# Patient Record
Sex: Female | Born: 1995
Health system: Southern US, Community
[De-identification: ages and names within clinical notes are randomized; demographics above are authoritative.]

## PROBLEM LIST (undated history)

## (undated) ENCOUNTER — Inpatient Hospital Stay (HOSPITAL_COMMUNITY): Payer: Self-pay

## (undated) DIAGNOSIS — F329 Major depressive disorder, single episode, unspecified: Secondary | ICD-10-CM

## (undated) DIAGNOSIS — E1140 Type 2 diabetes mellitus with diabetic neuropathy, unspecified: Secondary | ICD-10-CM

## (undated) DIAGNOSIS — B379 Candidiasis, unspecified: Secondary | ICD-10-CM

## (undated) DIAGNOSIS — F32A Depression, unspecified: Secondary | ICD-10-CM

## (undated) DIAGNOSIS — Z3042 Encounter for surveillance of injectable contraceptive: Secondary | ICD-10-CM

## (undated) DIAGNOSIS — Z8742 Personal history of other diseases of the female genital tract: Secondary | ICD-10-CM

## (undated) DIAGNOSIS — R55 Syncope and collapse: Secondary | ICD-10-CM

## (undated) DIAGNOSIS — O3090 Multiple gestation, unspecified, unspecified trimester: Secondary | ICD-10-CM

## (undated) DIAGNOSIS — R200 Anesthesia of skin: Secondary | ICD-10-CM

## (undated) DIAGNOSIS — N939 Abnormal uterine and vaginal bleeding, unspecified: Secondary | ICD-10-CM

## (undated) DIAGNOSIS — L03032 Cellulitis of left toe: Secondary | ICD-10-CM

## (undated) HISTORY — DX: Anesthesia of skin: R20.0

## (undated) HISTORY — DX: Candidiasis, unspecified: B37.9

## (undated) HISTORY — DX: Abnormal uterine and vaginal bleeding, unspecified: N93.9

## (undated) HISTORY — DX: Encounter for surveillance of injectable contraceptive: Z30.42

## (undated) HISTORY — PX: TONSILLECTOMY: SUR1361

## (undated) HISTORY — DX: Cellulitis of left toe: L03.032

## (undated) HISTORY — PX: ADENOIDECTOMY: SHX5191

---
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
## 2014-03-14 ENCOUNTER — Ambulatory Visit (INDEPENDENT_AMBULATORY_CARE_PROVIDER_SITE_OTHER): Payer: Medicaid Other | Admitting: *Deleted

## 2014-03-14 DIAGNOSIS — Z111 Encounter for screening for respiratory tuberculosis: Secondary | ICD-10-CM

## 2014-03-28 ENCOUNTER — Ambulatory Visit (INDEPENDENT_AMBULATORY_CARE_PROVIDER_SITE_OTHER): Payer: Medicaid Other | Admitting: *Deleted

## 2014-03-28 DIAGNOSIS — Z111 Encounter for screening for respiratory tuberculosis: Secondary | ICD-10-CM

## 2014-03-28 LAB — TB SKIN TEST

## 2014-03-30 ENCOUNTER — Encounter: Payer: Self-pay | Admitting: *Deleted

## 2014-03-30 ENCOUNTER — Ambulatory Visit (INDEPENDENT_AMBULATORY_CARE_PROVIDER_SITE_OTHER): Payer: Medicaid Other | Admitting: *Deleted

## 2014-03-30 DIAGNOSIS — Z111 Encounter for screening for respiratory tuberculosis: Secondary | ICD-10-CM

## 2014-03-30 LAB — TB SKIN TEST
Induration: 0 mm
TB SKIN TEST: NEGATIVE

## 2014-03-30 NOTE — Progress Notes (Signed)
   Pt in nurse clinic for PPD Reading.  Negative, 0 MM.  Lisa Marshall, Lisa L, RN

## 2014-04-15 ENCOUNTER — Other Ambulatory Visit: Payer: Self-pay | Admitting: Family Medicine

## 2014-06-27 ENCOUNTER — Encounter (HOSPITAL_COMMUNITY): Payer: Self-pay | Admitting: Obstetrics

## 2014-07-26 ENCOUNTER — Encounter (HOSPITAL_COMMUNITY): Payer: Self-pay | Admitting: *Deleted

## 2014-07-26 ENCOUNTER — Inpatient Hospital Stay (HOSPITAL_COMMUNITY)
Admission: AD | Admit: 2014-07-26 | Discharge: 2014-07-27 | Disposition: A | Discharge status: Home / Self Care | Payer: Medicaid Other | Source: Ambulatory Visit | Attending: Obstetrics & Gynecology | Admitting: Obstetrics & Gynecology

## 2014-07-26 DIAGNOSIS — N898 Other specified noninflammatory disorders of vagina: Secondary | ICD-10-CM | POA: Diagnosis not present

## 2014-07-26 DIAGNOSIS — Z3202 Encounter for pregnancy test, result negative: Secondary | ICD-10-CM | POA: Insufficient documentation

## 2014-07-26 DIAGNOSIS — R1032 Left lower quadrant pain: Secondary | ICD-10-CM | POA: Diagnosis not present

## 2014-07-26 DIAGNOSIS — K5900 Constipation, unspecified: Secondary | ICD-10-CM

## 2014-07-26 LAB — URINALYSIS, ROUTINE W REFLEX MICROSCOPIC
Bilirubin Urine: NEGATIVE
Glucose, UA: NEGATIVE mg/dL
Hgb urine dipstick: NEGATIVE
Ketones, ur: NEGATIVE mg/dL
LEUKOCYTES UA: NEGATIVE
Nitrite: NEGATIVE
PROTEIN: NEGATIVE mg/dL
Specific Gravity, Urine: 1.025 (ref 1.005–1.030)
UROBILINOGEN UA: 0.2 mg/dL (ref 0.0–1.0)
pH: 6.5 (ref 5.0–8.0)

## 2014-07-26 LAB — POCT PREGNANCY, URINE: Preg Test, Ur: NEGATIVE

## 2014-07-26 NOTE — MAU Note (Signed)
PT  SAYS SHE HAD TWIN ON 3-26-  BY   DR MARSHALL.    WAS  BREAST FEEDING.    SAYS  HER  INCISION  STARTED HURTING  ON 11-29-     NO MEDS.   DID  NOT CALL DR  MARSHALL.     SAYS HAS VAG D/C     AND  HAS ODOR.   WAS ON DEPO - FIRST INJECTION  WAS MARCH-  DID  NOT GET INJECTION   IN June    LAST SEX-  2014     HAS DIARRHEA-   LOOSE-    SEES BLOOD   WHEN  SHE WIPES.     FAMILY  DR-   IS  [facility]

## 2014-07-26 NOTE — MAU Provider Note (Signed)
History     CSN: [id number]  Arrival date and time: 07/26/14 2108   First Provider Initiated Contact with Patient 07/26/14 2351      No chief complaint on file.  HPI  Ms. Lisa Marshall is a [age] y.o. G1P1002 who presents to MAU today with complaint of LLQ pain and vaginal discharge. She states pain x 2 days that comes and goes. She states yellow-brown vaginal discharge and occasional spotting with wiping. Patient denies intercourse x > 1 year. She states that she was given Depo Provera after her last pregnancy and last dose was in March. She states normal periods have resumed x 3 months. She denies N/V/D but states mild constipation x 2 days. She reports subjective fevers.   OB History    Gravida Para Term Preterm AB TAB SAB Ectopic Multiple Living   1 1 1  0 0 0 0 0 1 2      Past Medical History  Diagnosis Date  . Asthma   . History of PID   . Depression   . Pregnancy, multiple     Past Surgical History  Procedure Laterality Date  . No past surgeries    . Tonsillectomy    . Addenoidectomy    . Cesarean section N/A 11/18/2013    Procedure: Primary Cesarean Section Delivery Baby "A" Girl @ 0447, Apgars 1/2/4, Baby "B" @ 0450, Apgars 8/9;  Surgeon: Kathreen CosierBernard A Marshall, MD;  Location: [facility];  Service: Obstetrics;  Laterality: N/A;    Family History  Problem Relation Age of Onset  . Asthma Other   . Diabetes Other   . Cancer Other   . Sickle cell anemia Mother     History  Substance Use Topics  . Smoking status: Never Smoker   . Smokeless tobacco: Not on file  . Alcohol Use: No    Allergies:  Allergies  Allergen Reactions  . Penicillins Hives    Prescriptions prior to admission  Medication Sig Dispense Refill Last Dose  . ibuprofen (ADVIL,MOTRIN) 600 MG tablet Take 1 tablet (600 mg total) by mouth every 6 (six) hours as needed. 30 tablet 5   . Iron-FA-B Cmp-C-Biot-Probiotic (FUSION PLUS) CAPS Take 1 capsule by mouth daily before breakfast. 30 capsule 5   .  oxyCODONE-acetaminophen (PERCOCET/ROXICET) 5-325 MG per tablet Take 1-2 tablets by mouth every 4 (four) hours as needed for severe pain (moderate - severe pain). 40 tablet 0   . PROVENTIL HFA 108 (90 BASE) MCG/ACT inhaler INHALE 2 PUFFS INTO THE LUNGS EVERY 6 (SIX) HOURS AS NEEDED FOR WHEEZING OR SHORTNESS OF BREATH. 6.7 each 2     Review of Systems  Constitutional: Negative for fever and malaise/fatigue.  Gastrointestinal: Positive for abdominal pain and constipation. Negative for nausea, vomiting and diarrhea.  Genitourinary: Negative for dysuria, urgency and frequency.       + vaginal bleeding, discharge   Physical Exam   Blood pressure 107/65, pulse 82, temperature 98.2 F (36.8 C), temperature source Oral, resp. rate 18, height 5\' 3"  (1.6 m), weight 287 lb 4 oz (130.296 kg), last menstrual period 07/17/2014, unknown if currently breastfeeding.  Physical Exam  Constitutional: She is oriented to person, place, and time. She appears well-developed and well-nourished. No distress.  HENT:  Head: Normocephalic.  Cardiovascular: Normal rate.   Respiratory: Effort normal.  GI: Soft. Bowel sounds are normal. She exhibits no distension and no mass. There is tenderness (mild tenderness to palpation of the LLQ). There is no rebound and no  guarding.  Genitourinary: Uterus is not enlarged and not tender. Cervix exhibits no motion tenderness, no discharge and no friability. Right adnexum displays no mass and no tenderness. Left adnexum displays no mass and no tenderness. No bleeding in the vagina. Vaginal discharge (small amount of yellow discharge noted with mucous) found.  Neurological: She is alert and oriented to person, place, and time.  Skin: Skin is warm and dry. No erythema.  Psychiatric: She has a normal mood and affect.   Results for orders placed or performed during the hospital encounter of 07/26/14 (from the past 24 hour(s))  Urinalysis, Routine w reflex microscopic     Status: None    Collection Time: 07/26/14 10:43 PM  Result Value Ref Range   Color, Urine YELLOW YELLOW   APPearance CLEAR CLEAR   Specific Gravity, Urine 1.025 1.005 - 1.030   pH 6.5 5.0 - 8.0   Glucose, UA NEGATIVE NEGATIVE mg/dL   Hgb urine dipstick NEGATIVE NEGATIVE   Bilirubin Urine NEGATIVE NEGATIVE   Ketones, ur NEGATIVE NEGATIVE mg/dL   Protein, ur NEGATIVE NEGATIVE mg/dL   Urobilinogen, UA 0.2 0.0 - 1.0 mg/dL   Nitrite NEGATIVE NEGATIVE   Leukocytes, UA NEGATIVE NEGATIVE  Pregnancy, urine POC     Status: None   Collection Time: 07/26/14 10:54 PM  Result Value Ref Range   Preg Test, Ur NEGATIVE NEGATIVE  Wet prep, genital     Status: Abnormal   Collection Time: 07/26/14 11:55 PM  Result Value Ref Range   Yeast Wet Prep HPF POC NONE SEEN NONE SEEN   Trich, Wet Prep NONE SEEN NONE SEEN   Clue Cells Wet Prep HPF POC NONE SEEN NONE SEEN   WBC, Wet Prep HPF POC FEW (A) NONE SEEN  CBC with Differential     Status: Abnormal   Collection Time: 07/27/14 12:00 AM  Result Value Ref Range   WBC 4.4 4.0 - 10.5 K/uL   RBC 4.25 3.87 - 5.11 MIL/uL   Hemoglobin 10.2 (L) 12.0 - 15.0 g/dL   HCT 11.932.1 (L) 14.736.0 - 82.946.0 %   MCV 75.5 (L) 78.0 - 100.0 fL   MCH 24.0 (L) 26.0 - 34.0 pg   MCHC 31.8 30.0 - 36.0 g/dL   RDW 56.217.3 (H) 13.011.5 - 86.515.5 %   Platelets 356 150 - 400 K/uL   Neutrophils Relative % 47 43 - 77 %   Neutro Abs 2.0 1.7 - 7.7 K/uL   Lymphocytes Relative 45 12 - 46 %   Lymphs Abs 2.0 0.7 - 4.0 K/uL   Monocytes Relative 7 3 - 12 %   Monocytes Absolute 0.3 0.1 - 1.0 K/uL   Eosinophils Relative 1 0 - 5 %   Eosinophils Absolute 0.1 0.0 - 0.7 K/uL   Basophils Relative 0 0 - 1 %   Basophils Absolute 0.0 0.0 - 0.1 K/uL    MAU Course  Procedures None  MDM UPT - negative UA,wet prep, GC/Chlamydia, CBC today  Assessment and Plan  A: LLQ abdominal pain, most likely GI  P: Discharge home GC/Chlamydia pending Patient advised to increase PO hydration Discussed use of Colace or Miralax OTC  PRN for constipation Patient advised to follow-up with [facility] if symptoms persist or worsen Patient may return to MAU as needed or if her condition were to change or worsen   Marny LowensteinJulie N Wenzel, [name]  07/26/2014, 11:51 PM

## 2014-07-27 DIAGNOSIS — R1032 Left lower quadrant pain: Secondary | ICD-10-CM

## 2014-07-27 DIAGNOSIS — K5900 Constipation, unspecified: Secondary | ICD-10-CM

## 2014-07-27 LAB — CBC WITH DIFFERENTIAL/PLATELET
BASOS ABS: 0 10*3/uL (ref 0.0–0.1)
Basophils Relative: 0 % (ref 0–1)
EOS ABS: 0.1 10*3/uL (ref 0.0–0.7)
EOS PCT: 1 % (ref 0–5)
HCT: 32.1 % — ABNORMAL LOW (ref 36.0–46.0)
Hemoglobin: 10.2 g/dL — ABNORMAL LOW (ref 12.0–15.0)
Lymphocytes Relative: 45 % (ref 12–46)
Lymphs Abs: 2 10*3/uL (ref 0.7–4.0)
MCH: 24 pg — AB (ref 26.0–34.0)
MCHC: 31.8 g/dL (ref 30.0–36.0)
MCV: 75.5 fL — AB (ref 78.0–100.0)
Monocytes Absolute: 0.3 10*3/uL (ref 0.1–1.0)
Monocytes Relative: 7 % (ref 3–12)
Neutro Abs: 2 10*3/uL (ref 1.7–7.7)
Neutrophils Relative %: 47 % (ref 43–77)
PLATELETS: 356 10*3/uL (ref 150–400)
RBC: 4.25 MIL/uL (ref 3.87–5.11)
RDW: 17.3 % — AB (ref 11.5–15.5)
WBC: 4.4 10*3/uL (ref 4.0–10.5)

## 2014-07-27 LAB — WET PREP, GENITAL
CLUE CELLS WET PREP: NONE SEEN
Trich, Wet Prep: NONE SEEN
YEAST WET PREP: NONE SEEN

## 2014-07-27 LAB — HIV ANTIBODY (ROUTINE TESTING W REFLEX): HIV: NONREACTIVE

## 2014-07-27 LAB — GC/CHLAMYDIA PROBE AMP
CT Probe RNA: NEGATIVE
GC Probe RNA: NEGATIVE

## 2014-07-27 NOTE — Discharge Instructions (Signed)
High-Fiber Diet Fiber is found in fruits, vegetables, and grains. A high-fiber diet encourages the addition of more whole grains, legumes, fruits, and vegetables in your diet. The recommended amount of fiber for adult males is 38 g per day. For adult females, it is 25 g per day. Pregnant and lactating women should get 28 g of fiber per day. If you have a digestive or bowel problem, ask your caregiver for advice before adding high-fiber foods to your diet. Eat a variety of high-fiber foods instead of only a select few type of foods.  PURPOSE  To increase stool bulk.  To make bowel movements more regular to prevent constipation.  To lower cholesterol.  To prevent overeating. WHEN IS THIS DIET USED?  It may be used if you have constipation and hemorrhoids.  It may be used if you have uncomplicated diverticulosis (intestine condition) and irritable bowel syndrome.  It may be used if you need help with weight management.  It may be used if you want to add it to your diet as a protective measure against atherosclerosis, diabetes, and cancer. SOURCES OF FIBER  Whole-grain breads and cereals.  Fruits, such as apples, oranges, bananas, berries, prunes, and pears.  Vegetables, such as green peas, carrots, sweet potatoes, beets, broccoli, cabbage, spinach, and artichokes.  Legumes, such split peas, soy, lentils.  Almonds. FIBER CONTENT IN FOODS Starches and Grains / Dietary Fiber (g)  Cheerios, 1 cup / 3 g  Corn Flakes cereal, 1 cup / 0.7 g  Rice crispy treat cereal, 1 cup / 0.3 g  Instant oatmeal (cooked),  cup / 2 g  Frosted wheat cereal, 1 cup / 5.1 g  Brown, long-grain rice (cooked), 1 cup / 3.5 g  White, long-grain rice (cooked), 1 cup / 0.6 g  Enriched macaroni (cooked), 1 cup / 2.5 g Legumes / Dietary Fiber (g)  Baked beans (canned, plain, or vegetarian),  cup / 5.2 g  Kidney beans (canned),  cup / 6.8 g  Pinto beans (cooked),  cup / 5.5 g Breads and Crackers  / Dietary Fiber (g)  Plain or honey graham crackers, 2 squares / 0.7 g  Saltine crackers, 3 squares / 0.3 g  Plain, salted pretzels, 10 pieces / 1.8 g  Whole-wheat bread, 1 slice / 1.9 g  White bread, 1 slice / 0.7 g  Raisin bread, 1 slice / 1.2 g  Plain bagel, 3 oz / 2 g  Flour tortilla, 1 oz / 0.9 g  Corn tortilla, 1 small / 1.5 g  Hamburger or hotdog bun, 1 small / 0.9 g Fruits / Dietary Fiber (g)  Apple with skin, 1 medium / 4.4 g  Sweetened applesauce,  cup / 1.5 g  Banana,  medium / 1.5 g  Grapes, 10 grapes / 0.4 g  Orange, 1 small / 2.3 g  Raisin, 1.5 oz / 1.6 g  Melon, 1 cup / 1.4 g Vegetables / Dietary Fiber (g)  Green beans (canned),  cup / 1.3 g  Carrots (cooked),  cup / 2.3 g  Broccoli (cooked),  cup / 2.8 g  Peas (cooked),  cup / 4.4 g  Mashed potatoes,  cup / 1.6 g  Lettuce, 1 cup / 0.5 g  Corn (canned),  cup / 1.6 g  Tomato,  cup / 1.1 g Document Released: 08/12/2005 Document Revised: 02/11/2012 Document Reviewed: 11/14/2011 ExitCare Patient Information 2015 [place], [place]. This information is not intended to replace advice given to you by your health care provider.  Make sure you discuss any questions you have with your health care provider. Constipation Constipation is when a person:  Poops (has a bowel movement) less than 3 times a week.  Has a hard time pooping.  Has poop that is dry, hard, or bigger than normal. HOME CARE   Eat foods with a lot of fiber in them. This includes fruits, vegetables, beans, and whole grains such as brown rice.  Avoid fatty foods and foods with a lot of sugar. This includes french fries, hamburgers, cookies, candy, and soda.  If you are not getting enough fiber from food, take products with added fiber in them (supplements).  Drink enough fluid to keep your pee (urine) clear or pale yellow.  Exercise on a regular basis, or as told by your doctor.  Go to the restroom when you feel like  you need to poop. Do not hold it.  Only take medicine as told by your doctor. Do not take medicines that help you poop (laxatives) without talking to your doctor first. GET HELP RIGHT AWAY IF:   You have bright red blood in your poop (stool).  Your constipation lasts more than 4 days or gets worse.  You have belly (abdominal) or butt (rectal) pain.  You have thin poop (as thin as a pencil).  You lose weight, and it cannot be explained. MAKE SURE YOU:   Understand these instructions.  Will watch your condition.  Will get help right away if you are not doing well or get worse. Document Released: 01/29/2008 Document Revised: 08/17/2013 Document Reviewed: 05/24/2013 [facility] Patient Information 2015 [place]. This information is not intended to replace advice given to you by your health care provider. Make sure you discuss any questions you have with your health care provider.  Try over-the-counter Colace or Miralax for relief as directed

## 2014-10-23 ENCOUNTER — Encounter (HOSPITAL_COMMUNITY): Payer: Self-pay | Admitting: Emergency Medicine

## 2014-10-23 ENCOUNTER — Emergency Department (HOSPITAL_COMMUNITY): Payer: Medicaid Other

## 2014-10-23 ENCOUNTER — Emergency Department (HOSPITAL_COMMUNITY)
Admission: EM | Admit: 2014-10-23 | Discharge: 2014-10-24 | Disposition: A | Discharge status: Home / Self Care | Payer: Medicaid Other | Attending: Emergency Medicine | Admitting: Emergency Medicine

## 2014-10-23 DIAGNOSIS — Z3202 Encounter for pregnancy test, result negative: Secondary | ICD-10-CM | POA: Insufficient documentation

## 2014-10-23 DIAGNOSIS — R1030 Lower abdominal pain, unspecified: Secondary | ICD-10-CM | POA: Insufficient documentation

## 2014-10-23 DIAGNOSIS — Z8659 Personal history of other mental and behavioral disorders: Secondary | ICD-10-CM | POA: Insufficient documentation

## 2014-10-23 DIAGNOSIS — G8918 Other acute postprocedural pain: Secondary | ICD-10-CM

## 2014-10-23 DIAGNOSIS — J45909 Unspecified asthma, uncomplicated: Secondary | ICD-10-CM | POA: Insufficient documentation

## 2014-10-23 DIAGNOSIS — Z880 Allergy status to penicillin: Secondary | ICD-10-CM | POA: Insufficient documentation

## 2014-10-23 DIAGNOSIS — Z79899 Other long term (current) drug therapy: Secondary | ICD-10-CM | POA: Diagnosis not present

## 2014-10-23 DIAGNOSIS — Z9889 Other specified postprocedural states: Secondary | ICD-10-CM | POA: Insufficient documentation

## 2014-10-23 DIAGNOSIS — N898 Other specified noninflammatory disorders of vagina: Secondary | ICD-10-CM | POA: Insufficient documentation

## 2014-10-23 DIAGNOSIS — J069 Acute upper respiratory infection, unspecified: Secondary | ICD-10-CM | POA: Diagnosis not present

## 2014-10-23 DIAGNOSIS — R1032 Left lower quadrant pain: Secondary | ICD-10-CM | POA: Diagnosis present

## 2014-10-23 DIAGNOSIS — R519 Headache, unspecified: Secondary | ICD-10-CM

## 2014-10-23 DIAGNOSIS — R51 Headache: Secondary | ICD-10-CM

## 2014-10-23 LAB — URINALYSIS, ROUTINE W REFLEX MICROSCOPIC
Bilirubin Urine: NEGATIVE
Glucose, UA: NEGATIVE mg/dL
Hgb urine dipstick: NEGATIVE
Ketones, ur: NEGATIVE mg/dL
Leukocytes, UA: NEGATIVE
NITRITE: NEGATIVE
Protein, ur: NEGATIVE mg/dL
Specific Gravity, Urine: 1.02 (ref 1.005–1.030)
Urobilinogen, UA: 0.2 mg/dL (ref 0.0–1.0)
pH: 6 (ref 5.0–8.0)

## 2014-10-23 LAB — CBC WITH DIFFERENTIAL/PLATELET
BASOS PCT: 0 % (ref 0–1)
Basophils Absolute: 0 10*3/uL (ref 0.0–0.1)
EOS PCT: 0 % (ref 0–5)
Eosinophils Absolute: 0 10*3/uL (ref 0.0–0.7)
HCT: 34.8 % — ABNORMAL LOW (ref 36.0–46.0)
Hemoglobin: 10.9 g/dL — ABNORMAL LOW (ref 12.0–15.0)
Lymphocytes Relative: 26 % (ref 12–46)
Lymphs Abs: 0.9 10*3/uL (ref 0.7–4.0)
MCH: 25.3 pg — AB (ref 26.0–34.0)
MCHC: 31.3 g/dL (ref 30.0–36.0)
MCV: 80.9 fL (ref 78.0–100.0)
MONO ABS: 0.3 10*3/uL (ref 0.1–1.0)
Monocytes Relative: 8 % (ref 3–12)
NEUTROS ABS: 2.3 10*3/uL (ref 1.7–7.7)
Neutrophils Relative %: 66 % (ref 43–77)
PLATELETS: 379 10*3/uL (ref 150–400)
RBC: 4.3 MIL/uL (ref 3.87–5.11)
RDW: 16 % — AB (ref 11.5–15.5)
WBC: 3.4 10*3/uL — ABNORMAL LOW (ref 4.0–10.5)

## 2014-10-23 LAB — COMPREHENSIVE METABOLIC PANEL
ALK PHOS: 106 U/L (ref 39–117)
ALT: 17 U/L (ref 0–35)
AST: 16 U/L (ref 0–37)
Albumin: 3.9 g/dL (ref 3.5–5.2)
Anion gap: 5 (ref 5–15)
BUN: 11 mg/dL (ref 6–23)
CHLORIDE: 105 mmol/L (ref 96–112)
CO2: 24 mmol/L (ref 19–32)
Calcium: 8.6 mg/dL (ref 8.4–10.5)
Creatinine, Ser: 0.52 mg/dL (ref 0.50–1.10)
GFR calc Af Amer: >90 mL/min (ref >90)
GFR calc non Af Amer: >90 mL/min (ref >90)
Glucose, Bld: 92 mg/dL (ref 70–99)
POTASSIUM: 3.4 mmol/L — AB (ref 3.5–5.1)
Sodium: 134 mmol/L — ABNORMAL LOW (ref 135–145)
TOTAL PROTEIN: 7.4 g/dL (ref 6.0–8.3)
Total Bilirubin: 0.4 mg/dL (ref 0.3–1.2)

## 2014-10-23 LAB — LIPASE, BLOOD: Lipase: 18 U/L (ref 11–59)

## 2014-10-23 LAB — POC URINE PREG, ED: Preg Test, Ur: NEGATIVE

## 2014-10-23 MED ORDER — OXYCODONE-ACETAMINOPHEN 5-325 MG PO TABS
2.0000 | ORAL_TABLET | Freq: Once | ORAL | Status: AC
  Administered 2014-10-23: 2 via ORAL
  Filled 2014-10-23: qty 2

## 2014-10-23 MED ORDER — ONDANSETRON 8 MG PO TBDP
8.0000 mg | ORAL_TABLET | Freq: Once | ORAL | Status: AC
  Administered 2014-10-23: 8 mg via ORAL
  Filled 2014-10-23: qty 1

## 2014-10-23 NOTE — ED Provider Notes (Signed)
CSN: [id number]     Arrival date & time 10/23/14  2035 History   First MD Initiated Contact with Patient 10/23/14 2324     Chief Complaint  Patient presents with  . Abdominal Pain  . Headache     (Consider location/radiation/quality/duration/timing/severity/associated sxs/prior Treatment) The history is provided by the patient and medical records. No language interpreter was used.     Lisa Marshall is a 19 y.o. female  G1P0202 with a hx of asthma, hx of PID, depression presents to the Emergency Department complaining of gradual, persistent, progressively worsening left sided abd pain onset 8am this morning.  Pt reports her abd feels sharp and stabbing in the LLQ and left side.  Pt reports she normally has 1 BM per day. Her last BM was yesterday morning and normal.  Pt reports she has not had a BM or flatulence since yesterday.  Pt reports she has had the pain before without a diagnosis.   Associated symptoms include 3 episodes of NBNB emesis.  No diarrhea.  Pt denies all urinary or vaginal complaints.  Nothing makes it better or worse.   Pt reports associated rhinorrhea, sinus congestion, and left frontal headache.  Pt denies ST, otalgia, fever, chills, neck pain, neck stiffness.  Patient immediately asked for food upon my entrance into the room.   Past Medical History  Diagnosis Date  . Asthma   . History of PID   . Depression   . Pregnancy, multiple    Past Surgical History  Procedure Laterality Date  . No past surgeries    . Tonsillectomy    . Addenoidectomy    . Cesarean section N/A 11/18/2013    Procedure: Primary Cesarean Section Delivery Baby "A" Girl @ 0447, Apgars 1/2/4, Baby "B" @ 0450, Apgars 8/9;  Surgeon: Kathreen Cosier, MD;  Location: [facility];  Service: Obstetrics;  Laterality: N/A;   Family History  Problem Relation Age of Onset  . Asthma Other   . Diabetes Other   . Cancer Other   . Sickle cell anemia Mother    History  Substance Use Topics  . Smoking  status: Never Smoker   . Smokeless tobacco: Not on file  . Alcohol Use: No   OB History    Gravida Para Term Preterm AB TAB SAB Ectopic Multiple Living   0 0 0 0 0 1 2     Review of Systems  Constitutional: Negative for fever, diaphoresis, appetite change, fatigue and unexpected weight change.  HENT: Positive for congestion, rhinorrhea and sinus pressure. Negative for mouth sores.   Eyes: Negative for visual disturbance.  Respiratory: Negative for cough, chest tightness, shortness of breath and wheezing.   Cardiovascular: Negative for chest pain.  Gastrointestinal: Positive for abdominal pain. Negative for nausea, vomiting, diarrhea and constipation.  Endocrine: Negative for polydipsia, polyphagia and polyuria.  Genitourinary: Negative for dysuria, urgency, frequency and hematuria.  Musculoskeletal: Negative for back pain and neck stiffness.  Skin: Negative for rash.  Allergic/Immunologic: Negative for immunocompromised state.  Neurological: Positive for headaches (frontal ). Negative for syncope and light-headedness.  Hematological: Does not bruise/bleed easily.  Psychiatric/Behavioral: Negative for sleep disturbance. The patient is not nervous/anxious.       Allergies  Penicillins  Home Medications   Prior to Admission medications   Medication Sig Start Date End Date Taking? Authorizing Provider  fluticasone (FLONASE) 50 MCG/ACT nasal spray Place 2 sprays into both nostrils daily. 10/24/14   Ambria Mayfield, [name]  ibuprofen (  ADVIL,MOTRIN) 800 MG tablet Take 1 tablet (800 mg total) by mouth 3 (three) times daily. 10/24/14   Anju Sereno, [name]  Iron-FA-B Cmp-C-Biot-Probiotic (FUSION PLUS) CAPS Take 1 capsule by mouth daily before breakfast. Patient not taking: Reported on 10/23/2014 11/21/13   Brock Bad, MD  oxyCODONE-acetaminophen (PERCOCET/ROXICET) 5-325 MG per tablet Take 1-2 tablets by mouth every 4 (four) hours as needed for severe pain (moderate -  severe pain). Patient not taking: Reported on 10/23/2014 11/21/13   Brock Bad, MD  promethazine (PHENERGAN) 25 MG tablet Take 1 tablet (25 mg total) by mouth every 6 (six) hours as needed for nausea or vomiting. 10/24/14   Dahlia Client Eben Choinski, [name]  PROVENTIL HFA 108 (90 BASE) MCG/ACT inhaler INHALE 2 PUFFS INTO THE LUNGS EVERY 6 (SIX) HOURS AS NEEDED FOR WHEEZING OR SHORTNESS OF BREATH. Patient not taking: Reported on 10/23/2014 04/15/14   Janit Pagan, MD   BP 108/52 mmHg  Pulse 73  Temp(Src) 98.1 F (36.7 C) (Oral)  Resp 20  Ht  (1.6 m)  Wt 280 lb (127.007 kg)  BMI 49.61 kg/m2  SpO2 100%  LMP 08/30/2014 Physical Exam  Constitutional: She appears well-developed and well-nourished. No distress.  Awake, alert, nontoxic appearance  HENT:  Head: Normocephalic and atraumatic.  Mouth/Throat: Oropharynx is clear and moist. No oropharyngeal exudate.  Eyes: Conjunctivae are normal. No scleral icterus.  Neck: Normal range of motion. Neck supple.  Cardiovascular: Normal rate, regular rhythm, normal heart sounds and intact distal pulses.   No murmur heard. Pulmonary/Chest: Effort normal and breath sounds normal. No respiratory distress. She has no wheezes.  Equal chest expansion  Abdominal: Soft. Bowel sounds are normal. She exhibits no distension and no mass. There is no tenderness. There is no rebound and no guarding. Hernia confirmed negative in the right inguinal area and confirmed negative in the left inguinal area.  Genitourinary: Uterus normal. No labial fusion. There is no rash, tenderness or lesion on the right labia. There is no rash, tenderness or lesion on the left labia. Uterus is not deviated, not enlarged, not fixed and not tender. Cervix exhibits no motion tenderness, no discharge and no friability. Right adnexum displays no mass, no tenderness and no fullness. Left adnexum displays no mass, no tenderness and no fullness. No erythema, tenderness or bleeding in the vagina.  No foreign body around the vagina. No signs of injury around the vagina. Vaginal discharge (Small, thin, white, nonodorous) found.  Musculoskeletal: Normal range of motion. She exhibits no edema.  Lymphadenopathy:       Right: No inguinal adenopathy present.       Left: No inguinal adenopathy present.  Neurological: She is alert.  Mental Status:  Alert, oriented, thought content appropriate, able to give a coherent history. Speech fluent without evidence of aphasia. Able to follow 2 step commands without difficulty.  Cranial Nerves:  II:  Peripheral visual fields grossly normal, pupils equal, round, reactive to light III,IV, VI: ptosis not present, extra-ocular motions intact bilaterally  V,VII: smile symmetric, facial light touch sensation equal VIII: hearing grossly normal to voice  X: uvula elevates symmetrically  XI: bilateral shoulder shrug symmetric and strong XII: midline tongue extension without fassiculations Motor:  Normal tone. 5/5 in upper and lower extremities bilaterally including strong and equal grip strength and dorsiflexion/plantar flexion Sensory: Pinprick and light touch normal in all extremities.  Deep Tendon Reflexes: 2+ and symmetric in the biceps and patella Cerebellar: normal finger-to-nose with bilateral upper extremities Gait: normal gait  and balance CV: distal pulses palpable throughout  No clonus  Skin: Skin is warm and dry. She is not diaphoretic. No erythema.  Psychiatric: She has a normal mood and affect.  Nursing note and vitals reviewed.   ED Course  Procedures (including critical care time) Labs Review Labs Reviewed  WET PREP, GENITAL - Abnormal; Notable for the following:    Yeast Wet Prep HPF POC FEW (*)    Clue Cells Wet Prep HPF POC RARE (*)    WBC, Wet Prep HPF POC RARE (*)    All other components within normal limits  CBC WITH DIFFERENTIAL/PLATELET - Abnormal; Notable for the following:    WBC 3.4 (*)    Hemoglobin 10.9 (*)    HCT 34.8  (*)    MCH 25.3 (*)    RDW 16.0 (*)    All other components within normal limits  COMPREHENSIVE METABOLIC PANEL - Abnormal; Notable for the following:    Sodium 134 (*)    Potassium 3.4 (*)    All other components within normal limits  LIPASE, BLOOD  URINALYSIS, ROUTINE W REFLEX MICROSCOPIC  POC URINE PREG, ED  GC/CHLAMYDIA PROBE AMP ([place])    Imaging Review Dg Abd Acute W/chest  10/24/2014   CLINICAL DATA:  Abdominal pain at cesarean section scar since March 2015. Nausea and vomiting starting today.  EXAM: ACUTE ABDOMEN SERIES (ABDOMEN 2 VIEW & CHEST 1 VIEW)  COMPARISON:  None.  FINDINGS: There is no evidence of dilated bowel loops or free intraperitoneal air. Left pelvic calcifications are likely phleboliths; there is no concerning intra-abdominal mass effect or calcification. Heart size and mediastinal contours are within normal limits. Both lungs are clear.  IMPRESSION: Negative abdominal radiographs.  No acute cardiopulmonary disease.   Electronically Signed   By: Marnee Spring M.D.   On: 10/24/2014 00:24     EKG Interpretation None      MDM   Final diagnoses:  Pain at surgical site  Lower abdominal pain  Viral URI  Sinus headache   Quentina S Joubert presents with complaints of mild left-sided abdominal pain.  She also reports left-sided frontal, sinus headache which began this morning. She denies vision changes. Her vitals are stable. She's had no vomiting in the room. Abdomen is soft and nontender. Labs are reassuring.  Pelvic exam without cervical motion or adnexal tenderness. Wet prep non-concerning for vaginal infection or STI.  UA without urinary tract infection.  Patient is nontoxic, nonseptic appearing, in no apparent distress.  Patient's pain and other symptoms adequately managed in emergency department. Labs, imaging and vitals reviewed.  Patient does not meet the SIRS or Sepsis criteria.  On repeat exam patient does not have a surgical abdomin and there are  no peritoneal signs.  No indication of appendicitis, bowel obstruction, bowel perforation, cholecystitis, diverticulitis, PID or ectopic pregnancy.  Patient discharged home with symptomatic treatment and given strict instructions for follow-up with their primary care physician.   Pt HA treated and improved while in ED.  Presentation is non concerning for [facility], ICH, Meningitis, or temporal arteritis. Pt is afebrile with no focal neuro deficits, nuchal rigidity, or change in vision.   I have personally reviewed patient's vitals, nursing note and any pertinent labs or imaging.  I performed an focused physical exam; undressed when appropriate .    It has been determined that no acute conditions requiring further emergency intervention are present at this time. The patient/guardian have been advised of the diagnosis and plan.  I reviewed any labs and imaging including any potential incidental findings. We have discussed signs and symptoms that warrant return to the ED and they are listed in the discharge instructions.    Vital signs are stable at discharge.   BP 108/52 mmHg  Pulse 73  Temp(Src) 98.1 F (36.7 C) (Oral)  Resp 20  Ht 5\' 3"  (1.6 m)  Wt 280 lb (127.007 kg)  BMI 49.61 kg/m2  SpO2 100%  LMP 08/30/2014         Dahlia ClientHannah Hugh Garrow, [name] 10/24/14 0124  Olivia Mackielga M Otter, MD 10/24/14 [phone]

## 2014-10-23 NOTE — ED Notes (Signed)
Pt states that tonight she started having N/V, abdominal pain and a headache tonight. Pain is in lower abdomen where her C-section scar is. Denies vag discharge. Denies urinary sx. Alert and oriented.

## 2014-10-23 NOTE — ED Notes (Signed)
Pt complaining of left sided headache. Started this morning when she woke up.  Mild dizziness. Denies blurry vision. Light sensitive.

## 2014-10-24 LAB — WET PREP, GENITAL: TRICH WET PREP: NONE SEEN

## 2014-10-24 LAB — GC/CHLAMYDIA PROBE AMP (~~LOC~~) NOT AT ARMC
CHLAMYDIA, DNA PROBE: NEGATIVE
NEISSERIA GONORRHEA: NEGATIVE

## 2014-10-24 MED ORDER — PROMETHAZINE HCL 25 MG PO TABS: 25.0000 mg | ORAL_TABLET | Freq: Four times a day (QID) | ORAL | Status: DC | PRN

## 2014-10-24 MED ORDER — IBUPROFEN 800 MG PO TABS: 800.0000 mg | ORAL_TABLET | Freq: Three times a day (TID) | ORAL | Status: DC

## 2014-10-24 MED ORDER — FLUTICASONE PROPIONATE 50 MCG/ACT NA SUSP: 2.0000 | Freq: Every day | NASAL | Status: DC

## 2014-10-24 NOTE — Discharge Instructions (Signed)
1. Medications: ibuprofen, flonase, usual home medications 2. Treatment: rest, drink plenty of fluids, advance diet slowly 3. Follow Up: Please followup with your primary doctor in 2 days for discussion of your diagnoses and further evaluation after today's visit; if you do not have a primary care doctor use the resource guide provided to find one; Please return to the ER for persistent vomiting, high fevers or worsening symptoms    Abdominal Pain, Women Abdominal (stomach, pelvic, or belly) pain can be caused by many things. It is important to tell your doctor:  The location of the pain.  Does it come and go or is it present all the time?  Are there things that start the pain (eating certain foods, exercise)?  Are there other symptoms associated with the pain (fever, nausea, vomiting, diarrhea)? All of this is helpful to know when trying to find the cause of the pain. CAUSES   Stomach: virus or bacteria infection, or ulcer.  Intestine: appendicitis (inflamed appendix), regional ileitis (Crohn's disease), ulcerative colitis (inflamed colon), irritable bowel syndrome, diverticulitis (inflamed diverticulum of the colon), or cancer of the stomach or intestine.  Gallbladder disease or stones in the gallbladder.  Kidney disease, kidney stones, or infection.  Pancreas infection or cancer.  Fibromyalgia (pain disorder).  Diseases of the female organs:  Uterus: fibroid (non-cancerous) tumors or infection.  Fallopian tubes: infection or tubal pregnancy.  Ovary: cysts or tumors.  Pelvic adhesions (scar tissue).  Endometriosis (uterus lining tissue growing in the pelvis and on the pelvic organs).  Pelvic congestion syndrome (female organs filling up with blood just before the menstrual period).  Pain with the menstrual period.  Pain with ovulation (producing an egg).  Pain with an IUD (intrauterine device, birth control) in the uterus.  Cancer of the female  organs.  Functional pain (pain not caused by a disease, may improve without treatment).  Psychological pain.  Depression. DIAGNOSIS  Your doctor will decide the seriousness of your pain by doing an examination.  Blood tests.  X-rays.  Ultrasound.  CT scan (computed tomography, special type of X-ray).  MRI (magnetic resonance imaging).  Cultures, for infection.  Barium enema (dye inserted in the large intestine, to better view it with X-rays).  Colonoscopy (looking in intestine with a lighted tube).  Laparoscopy (minor surgery, looking in abdomen with a lighted tube).  Major abdominal exploratory surgery (looking in abdomen with a large incision). TREATMENT  The treatment will depend on the cause of the pain.   Many cases can be observed and treated at home.  Over-the-counter medicines recommended by your caregiver.  Prescription medicine.  Antibiotics, for infection.  Birth control pills, for painful periods or for ovulation pain.  Hormone treatment, for endometriosis.  Nerve blocking injections.  Physical therapy.  Antidepressants.  Counseling with a psychologist or psychiatrist.  Minor or major surgery. HOME CARE INSTRUCTIONS   Do not take laxatives, unless directed by your caregiver.  Take over-the-counter pain medicine only if ordered by your caregiver. Do not take aspirin because it can cause an upset stomach or bleeding.  Try a clear liquid diet (broth or water) as ordered by your caregiver. Slowly move to a bland diet, as tolerated, if the pain is related to the stomach or intestine.  Have a thermometer and take your temperature several times a day, and record it.  Bed rest and sleep, if it helps the pain.  Avoid sexual intercourse, if it causes pain.  Avoid stressful situations.  Keep your follow-up appointments  and tests, as your caregiver orders.  If the pain does not go away with medicine or surgery, you may  try:  Acupuncture.  Relaxation exercises (yoga, meditation).  Group therapy.  Counseling. SEEK MEDICAL CARE IF:   You notice certain foods cause stomach pain.  Your home care treatment is not helping your pain.  You need stronger pain medicine.  You want your IUD removed.  You feel faint or lightheaded.  You develop nausea and vomiting.  You develop a rash.  You are having side effects or an allergy to your medicine. SEEK IMMEDIATE MEDICAL CARE IF:   Your pain does not go away or gets worse.  You have a fever.  Your pain is felt only in portions of the abdomen. The right side could possibly be appendicitis. The left lower portion of the abdomen could be colitis or diverticulitis.  You are passing blood in your stools (bright red or black tarry stools, with or without vomiting).  You have blood in your urine.  You develop chills, with or without a fever.  You pass out. MAKE SURE YOU:   Understand these instructions.  Will watch your condition.  Will get help right away if you are not doing well or get worse. Document Released: 06/09/2007 Document Revised: 12/27/2013 Document Reviewed: 06/29/2009 [facility] Patient Information 2015 [place], [place]. This information is not intended to replace advice given to you by your health care provider. Make sure you discuss any questions you have with your health care provider.

## 2014-10-24 NOTE — ED Notes (Signed)
Provided patient Lisa Marshall and water for PO challenge.

## 2014-11-16 ENCOUNTER — Encounter (HOSPITAL_COMMUNITY): Payer: Self-pay

## 2014-11-16 ENCOUNTER — Emergency Department (HOSPITAL_COMMUNITY)
Admission: EM | Admit: 2014-11-16 | Discharge: 2014-11-16 | Disposition: A | Discharge status: Home / Self Care | Payer: Medicaid Other | Attending: Emergency Medicine | Admitting: Emergency Medicine

## 2014-11-16 DIAGNOSIS — Z791 Long term (current) use of non-steroidal anti-inflammatories (NSAID): Secondary | ICD-10-CM | POA: Insufficient documentation

## 2014-11-16 DIAGNOSIS — J0300 Acute streptococcal tonsillitis, unspecified: Secondary | ICD-10-CM | POA: Insufficient documentation

## 2014-11-16 DIAGNOSIS — Z8659 Personal history of other mental and behavioral disorders: Secondary | ICD-10-CM | POA: Diagnosis not present

## 2014-11-16 DIAGNOSIS — Z880 Allergy status to penicillin: Secondary | ICD-10-CM | POA: Insufficient documentation

## 2014-11-16 DIAGNOSIS — Z7951 Long term (current) use of inhaled steroids: Secondary | ICD-10-CM | POA: Insufficient documentation

## 2014-11-16 DIAGNOSIS — Z79899 Other long term (current) drug therapy: Secondary | ICD-10-CM | POA: Diagnosis not present

## 2014-11-16 DIAGNOSIS — J45909 Unspecified asthma, uncomplicated: Secondary | ICD-10-CM | POA: Insufficient documentation

## 2014-11-16 DIAGNOSIS — Z8742 Personal history of other diseases of the female genital tract: Secondary | ICD-10-CM | POA: Insufficient documentation

## 2014-11-16 DIAGNOSIS — J029 Acute pharyngitis, unspecified: Secondary | ICD-10-CM | POA: Diagnosis present

## 2014-11-16 LAB — RAPID STREP SCREEN (MED CTR MEBANE ONLY): STREPTOCOCCUS, GROUP A SCREEN (DIRECT): POSITIVE — AB

## 2014-11-16 MED ORDER — HYDROCODONE-ACETAMINOPHEN 7.5-325 MG/15ML PO SOLN
10.0000 mL | Freq: Once | ORAL | Status: AC
  Administered 2014-11-16: 10 mL via ORAL
  Filled 2014-11-16: qty 15

## 2014-11-16 MED ORDER — HYDROCODONE-ACETAMINOPHEN 7.5-325 MG/15ML PO SOLN: 10.0000 mL | Freq: Four times a day (QID) | ORAL | Status: DC | PRN

## 2014-11-16 MED ORDER — CLINDAMYCIN HCL 300 MG PO CAPS: 300.0000 mg | ORAL_CAPSULE | Freq: Four times a day (QID) | ORAL | Status: DC

## 2014-11-16 MED ORDER — CLINDAMYCIN HCL 300 MG PO CAPS
300.0000 mg | ORAL_CAPSULE | Freq: Once | ORAL | Status: AC
  Administered 2014-11-16: 300 mg via ORAL
  Filled 2014-11-16: qty 1

## 2014-11-16 MED ORDER — DEXAMETHASONE SODIUM PHOSPHATE 10 MG/ML IJ SOLN
10.0000 mg | Freq: Once | INTRAMUSCULAR | Status: AC
  Administered 2014-11-16: 10 mg via INTRAMUSCULAR
  Filled 2014-11-16: qty 1

## 2014-11-16 NOTE — Discharge Instructions (Signed)
Read the information below.  Use the prescribed medication as directed.  Please discuss all new medications with your pharmacist.  Do not take additional tylenol while taking the prescribed pain medication to avoid overdose.  You may return to the Emergency Department at any time for worsening condition or any new symptoms that concern you.   If there is any possibility that you might be pregnant or if you are breast feeding, please let your health care provider know and discuss this with the pharmacist to ensure medication safety.  If you develop high fevers, difficulty swallowing or breathing, or you are unable to tolerate fluids by mouth, return to the ER immediately for a recheck.      Strep Throat Strep throat is an infection of the throat caused by a bacteria named Streptococcus pyogenes. Your health care provider may call the infection streptococcal "tonsillitis" or "pharyngitis" depending on whether there are signs of inflammation in the tonsils or back of the throat. Strep throat is most common in children aged 5-15 years during the cold months of the year, but it can occur in people of any age during any season. This infection is spread from person to person (contagious) through coughing, sneezing, or other close contact. SIGNS AND SYMPTOMS   Fever or chills.  Painful, swollen, red tonsils or throat.  Pain or difficulty when swallowing.  White or yellow spots on the tonsils or throat.  Swollen, tender lymph nodes or "glands" of the neck or under the jaw.  Red rash all over the body (rare). DIAGNOSIS  Many different infections can cause the same symptoms. A test must be done to confirm the diagnosis so the right treatment can be given. A "rapid strep test" can help your health care provider make the diagnosis in a few minutes. If this test is not available, a light swab of the infected area can be used for a throat culture test. If a throat culture test is done, results are usually  available in a day or two. TREATMENT  Strep throat is treated with antibiotic medicine. HOME CARE INSTRUCTIONS   Gargle with 1 tsp of salt in 1 cup of warm water, 3-4 times per day or as needed for comfort.  Family members who also have a sore throat or fever should be tested for strep throat and treated with antibiotics if they have the strep infection.  Make sure everyone in your household washes their hands well.  Do not share food, drinking cups, or personal items that could cause the infection to spread to others.  You may need to eat a soft food diet until your sore throat gets better.  Drink enough water and fluids to keep your urine clear or pale yellow. This will help prevent dehydration.  Get plenty of rest.  Stay home from school, day care, or work until you have been on antibiotics for 24 hours.  Take medicines only as directed by your health care provider.  Take your antibiotic medicine as directed by your health care provider. Finish it even if you start to feel better. SEEK MEDICAL CARE IF:   The glands in your neck continue to enlarge.  You develop a rash, cough, or earache.  You cough up green, yellow-brown, or bloody sputum.  You have pain or discomfort not controlled by medicines.  Your problems seem to be getting worse rather than better.  You have a fever. SEEK IMMEDIATE MEDICAL CARE IF:   You develop any new symptoms such as  vomiting, severe headache, stiff or painful neck, chest pain, shortness of breath, or trouble swallowing.  You develop severe throat pain, drooling, or changes in your voice.  You develop swelling of the neck, or the skin on the neck becomes red and tender.  You develop signs of dehydration, such as fatigue, dry mouth, and decreased urination.  You become increasingly sleepy, or you cannot wake up completely. MAKE SURE YOU:  Understand these instructions.  Will watch your condition.  Will get help right away if you are  not doing well or get worse. Document Released: 08/09/2000 Document Revised: 12/27/2013 Document Reviewed: 10/11/2010 [facility] Patient Information 2015 [employer], [place]. This information is not intended to replace advice given to you by your health care provider. Make sure you discuss any questions you have with your health care provider.

## 2014-11-16 NOTE — ED Provider Notes (Signed)
CSN: [id number]     Arrival date & time 11/16/14  1307 History  This chart was scribed for non-physician practitioner, Trixie Dredge, [name],working with Purvis Sheffield, MD, by Karle Plumber, ED Scribe. This patient was seen in room WTR9/WTR9 and the patient's care was started at 2:34 PM.  Chief Complaint  Patient presents with  . Nasal Congestion  . Sore Throat   Patient is a 19 y.o. female presenting with pharyngitis. The history is provided by the patient and medical records. No language interpreter was used.  Sore Throat Pertinent negatives include no chest pain, no abdominal pain and no shortness of breath.    HPI Comments:  Lisa Marshall is an obese 19 y.o. female who presents to the Emergency Department complaining of severe sore throat and nasal congestion. She reports associated subjective fever and chills. She has not done anything to treat her symptoms. Swallowing makes the pain worse. Denies alleviating factors. Denies abdominal pain, nausea or vomiting. PMHx of asthma, PID and depression.  Past Medical History  Diagnosis Date  . Asthma   . History of PID   . Depression   . Pregnancy, multiple    Past Surgical History  Procedure Laterality Date  . No past surgeries    . Tonsillectomy    . Addenoidectomy    . Cesarean section N/A 11/18/2013    Procedure: Primary Cesarean Section Delivery Baby "A" Girl @ 0447, Apgars 1/2/4, Baby "B" @ 0450, Apgars 8/9;  Surgeon: Kathreen Cosier, MD;  Location: [facility];  Service: Obstetrics;  Laterality: N/A;   Family History  Problem Relation Age of Onset  . Asthma Other   . Diabetes Other   . Cancer Other   . Sickle cell anemia Mother    History  Substance Use Topics  . Smoking status: Never Smoker   . Smokeless tobacco: Never Used  . Alcohol Use: No   OB History    Gravida Para Term Preterm AB TAB SAB Ectopic Multiple Living   0 0 0 0 0 1 2     Review of Systems  Constitutional: Positive for fever (subjective)  and chills.  HENT: Positive for congestion and sore throat. Negative for drooling, facial swelling and trouble swallowing.   Respiratory: Negative for cough and shortness of breath.   Cardiovascular: Negative for chest pain.  Gastrointestinal: Negative for nausea, vomiting and abdominal pain.  Musculoskeletal: Negative for myalgias.  Skin: Negative for color change.  Allergic/Immunologic: Negative for immunocompromised state.  Hematological: Does not bruise/bleed easily.  Psychiatric/Behavioral: Negative for self-injury.    Allergies  Penicillins  Home Medications   Prior to Admission medications   Medication Sig Start Date End Date Taking? Authorizing Provider  albuterol (PROVENTIL HFA;VENTOLIN HFA) 108 (90 BASE) MCG/ACT inhaler Inhale 1-2 puffs into the lungs every 6 (six) hours as needed for wheezing or shortness of breath.   Yes Historical Provider, MD  fluticasone (FLONASE) 50 MCG/ACT nasal spray Place 2 sprays into both nostrils daily. 10/24/14  Yes Hannah Muthersbaugh, [name]  ibuprofen (ADVIL,MOTRIN) 800 MG tablet Take 1 tablet (800 mg total) by mouth 3 (three) times daily. 10/24/14  Yes Hannah Muthersbaugh, [name]  Phenol-Phenolate Sodium (ANTISEPTIC THROAT SPRAY MT) Use as directed 1 spray in the mouth or throat as needed (for sore throat).   Yes Historical Provider, MD  Phenyleph-Doxylamine-DM-APAP 5-6.25-10-325 MG/15ML LIQD Take 30 mLs by mouth at bedtime as needed (for cold/sleep).   Yes Historical Provider, MD  promethazine (PHENERGAN) 25 MG tablet  Take 1 tablet (25 mg total) by mouth every 6 (six) hours as needed for nausea or vomiting. 10/24/14  Yes Hannah Muthersbaugh, [name]  Iron-FA-B Cmp-C-Biot-Probiotic (FUSION PLUS) CAPS Take 1 capsule by mouth daily before breakfast. Patient not taking: Reported on 10/23/2014 11/21/13   Brock Badharles A Harper, MD  oxyCODONE-acetaminophen (PERCOCET/ROXICET) 5-325 MG per tablet Take 1-2 tablets by mouth every 4 (four) hours as needed for severe pain  (moderate - severe pain). Patient not taking: Reported on 10/23/2014 11/21/13   Brock Badharles A Harper, MD  PROVENTIL HFA 108 (90 BASE) MCG/ACT inhaler INHALE 2 PUFFS INTO THE LUNGS EVERY 6 (SIX) HOURS AS NEEDED FOR WHEEZING OR SHORTNESS OF BREATH. Patient not taking: Reported on 11/16/2014 04/15/14   Doreene ElandKehinde T Eniola, MD   Triage Vitals: BP 119/72 mmHg  Pulse 103  Temp(Src) 99 F (37.2 C) (Oral)  Resp 20  SpO2 98%  LMP 11/05/2014 Physical Exam  Constitutional: She appears well-developed and well-nourished. No distress.  HENT:  Head: Normocephalic and atraumatic.  Mouth/Throat: Uvula is midline and mucous membranes are normal. No trismus in the jaw. Oropharyngeal exudate, posterior oropharyngeal edema and posterior oropharyngeal erythema present. No tonsillar abscesses.  Tonsils symmetrical.  Eyes: Conjunctivae are normal.  Neck: Neck supple.  Cardiovascular: Normal rate, regular rhythm and normal heart sounds.  Exam reveals no gallop and no friction rub.   No murmur heard. Pulmonary/Chest: Effort normal and breath sounds normal. No stridor. No respiratory distress. She has no wheezes. She has no rales.  Lymphadenopathy:    She has cervical adenopathy.  Neurological: She is alert.  Skin: She is not diaphoretic.  Nursing note and vitals reviewed.   ED Course  Procedures (including critical care time) DIAGNOSTIC STUDIES: Oxygen Saturation is 98% on RA, normal by my interpretation.   COORDINATION OF CARE: 2:37 PM- Will prescribe antibiotics. Pt verbalizes understanding and agrees to plan.  Medications  HYDROcodone-acetaminophen (HYCET) 7.5-325 mg/15 ml solution 10 mL (not administered)  dexamethasone (DECADRON) injection 10 mg (not administered)  clindamycin (CLEOCIN) capsule 300 mg (not administered)    Labs Review Labs Reviewed  RAPID STREP SCREEN - Abnormal; Notable for the following:    Streptococcus, Group A Screen (Direct) POSITIVE (*)    All other components within normal  limits    Imaging Review No results found.   EKG Interpretation None      MDM   Final diagnoses:  Strep tonsillitis    Afebrile, nontoxic patient with sore throat, nasal congestion, subjective fever x 2 days.  Strep screen positive.  Tolerating oral secretions, no stridor - no airway concerns.  Tonsils are symmetric, doubt peritonsillar abscess.   D/C home with pain medication, antibiotics, PCP follow up.  Strict return precautions.  Discussed importance of maintaining hydration.  Discussed result, findings, treatment, and follow up  with patient.  Pt given return precautions.  Pt verbalizes understanding and agrees with plan.       I personally performed the services described in this documentation, which was scribed in my presence. The recorded information has been reviewed and is accurate.    Trixie Dredgemily Esperanza Madrazo, [name] 11/16/14 1452  Purvis SheffieldForrest Harrison, MD 11/16/14 2213

## 2014-11-16 NOTE — ED Notes (Signed)
Patient c/o nasal congestion and a sore throat x 2 days. Patient reports fever and chills.

## 2014-11-16 NOTE — ED Notes (Signed)
Pt drank cup water with her PO medications with out any difficulties.

## 2014-12-23 ENCOUNTER — Other Ambulatory Visit (HOSPITAL_COMMUNITY)
Admission: RE | Admit: 2014-12-23 | Discharge: 2014-12-23 | Disposition: A | Discharge status: Home / Self Care | Payer: Medicaid Other | Source: Ambulatory Visit | Attending: Family Medicine | Admitting: Family Medicine

## 2014-12-23 ENCOUNTER — Ambulatory Visit (INDEPENDENT_AMBULATORY_CARE_PROVIDER_SITE_OTHER): Payer: Medicaid Other | Admitting: Family Medicine

## 2014-12-23 ENCOUNTER — Encounter: Payer: Self-pay | Admitting: Family Medicine

## 2014-12-23 VITALS — BP 122/69 | HR 97 | Temp 98.3°F | Ht 63.0 in | Wt 284.0 lb

## 2014-12-23 DIAGNOSIS — N912 Amenorrhea, unspecified: Secondary | ICD-10-CM | POA: Diagnosis present

## 2014-12-23 DIAGNOSIS — Z3042 Encounter for surveillance of injectable contraceptive: Secondary | ICD-10-CM | POA: Insufficient documentation

## 2014-12-23 DIAGNOSIS — Z202 Contact with and (suspected) exposure to infections with a predominantly sexual mode of transmission: Secondary | ICD-10-CM | POA: Insufficient documentation

## 2014-12-23 DIAGNOSIS — Z113 Encounter for screening for infections with a predominantly sexual mode of transmission: Secondary | ICD-10-CM

## 2014-12-23 HISTORY — DX: Encounter for surveillance of injectable contraceptive: Z30.42

## 2014-12-23 LAB — POCT WET PREP (WET MOUNT)
Clue Cells Wet Prep Whiff POC: NEGATIVE
WBC, Wet Prep HPF POC: >20

## 2014-12-23 LAB — POCT URINE PREGNANCY: Preg Test, Ur: NEGATIVE

## 2014-12-23 MED ORDER — MEDROXYPROGESTERONE ACETATE 150 MG/ML IM SUSP
150.0000 mg | Freq: Once | INTRAMUSCULAR | Status: AC
  Administered 2014-12-23: 150 mg via INTRAMUSCULAR

## 2014-12-23 NOTE — Assessment & Plan Note (Signed)
STD counseling done. HIV,RPR, GC,Chlamydia checked today. I will call her with result.

## 2014-12-23 NOTE — Patient Instructions (Signed)
Sexually Transmitted Disease A sexually transmitted disease (STD) is a disease or infection often passed to another person during sex. However, STDs can be passed through nonsexual ways. An STD can be passed through:  Spit (saliva).  Semen.  Blood.  Mucus from the vagina.  Pee (urine). HOW CAN I LESSEN MY CHANCES OF GETTING AN STD?  Use:  Latex condoms.  Water-soluble lubricants with condoms. Do not use petroleum jelly or oils.  Dental dams. These are small pieces of latex that are used as a barrier during oral sex.  Avoid having more than one sex partner.  Do not have sex with someone who has other sex partners.  Do not have sex with anyone you do not know or who is at high risk for an STD.  Avoid risky sex that can break your skin.  Do not have sex if you have open sores on your mouth or skin.  Avoid drinking too much alcohol or taking illegal drugs. Alcohol and drugs can affect your good judgment.  Avoid oral and anal sex acts.  Get shots (vaccines) for HPV and hepatitis.  If you are at risk of being infected with HIV, it is advised that you take a certain medicine daily to prevent HIV infection. This is called pre-exposure prophylaxis (PrEP). You may be at risk if:  You are a man who has sex with other men (MSM).  You are attracted to the opposite sex (heterosexual) and are having sex with more than one partner.  You take drugs with a needle.  You have sex with someone who has HIV.  Talk with your doctor about if you are at high risk of being infected with HIV. If you begin to take PrEP, get tested for HIV first. Get tested every 3 months for as long as you are taking PrEP. WHAT SHOULD I DO IF I THINK I HAVE AN STD?  See your doctor.  Tell your sex partner(s) that you have an STD. They should be tested and treated.  Do not have sex until your doctor says it is okay. WHEN SHOULD I GET HELP? Get help right away if:  You have bad belly (abdominal)  pain.  You are a man and have puffiness (swelling) or pain in your testicles.  You are a woman and have puffiness in your vagina. Document Released: 09/19/2004 Document Revised: 08/17/2013 Document Reviewed: 02/05/2013 Exit[employer] Patient Information 2015 [employer]. This information is not intended to replace advice given to you by your health care provider. Make sure you discuss any questions you have with your health care provider.  

## 2014-12-23 NOTE — Assessment & Plan Note (Signed)
Urine pregnancy test negative. Depo given. F/U in 3 months for shot.

## 2014-12-23 NOTE — Progress Notes (Signed)
Subjective:     Patient ID: Lisa Marshall, female   DOB: [birth year], 19 y.o.   MRN: [id number]  HPI STD check: Patient is concern for STD, she is sexually active with a new partner of one month. They had sex 2 days ago and the condom broke. In the last 12 months she had been with 2 partners, she uses condom regularly. Denies vaginal discharge, lesion or itching. No GU symptoms. Birth control:Patient will like to get back on Depo. She missed her shot last month. LMP was about 3 wks ago.  Current Outpatient Prescriptions on File Prior to Visit  Medication Sig Dispense Refill  . albuterol (PROVENTIL HFA;VENTOLIN HFA) 108 (90 BASE) MCG/ACT inhaler Inhale 1-2 puffs into the lungs every 6 (six) hours as needed for wheezing or shortness of breath.    . fluticasone (FLONASE) 50 MCG/ACT nasal spray Place 2 sprays into both nostrils daily. (Patient not taking: Reported on 12/23/2014) 9.9 g 2  . PROVENTIL HFA 108 (90 BASE) MCG/ACT inhaler INHALE 2 PUFFS INTO THE LUNGS EVERY 6 (SIX) HOURS AS NEEDED FOR WHEEZING OR SHORTNESS OF BREATH. (Patient not taking: Reported on 11/16/2014) 6.7 each 2   No current facility-administered medications on file prior to visit.   Past Medical History  Diagnosis Date  . Asthma   . History of PID   . Depression   . Pregnancy, multiple      Review of Systems  Respiratory: Negative.   Cardiovascular: Negative.   Gastrointestinal: Negative.   Genitourinary: Negative.   All other systems reviewed and are negative.      Filed Vitals:   12/23/14 0916  BP: 122/69  Pulse: 97  Temp: 98.3 F (36.8 C)  TempSrc: Oral  Height: 5\' 3"  (1.6 m)  Weight: 284 lb (128.822 kg)    Objective:   Physical Exam  Constitutional: She appears well-developed. No distress.  Cardiovascular: Normal rate, regular rhythm, normal heart sounds and intact distal pulses.   No murmur heard. Pulmonary/Chest: Effort normal and breath sounds normal. No respiratory distress. She has no  wheezes.  Abdominal: Soft. Bowel sounds are normal. She exhibits no distension and no mass. There is no tenderness. There is no rebound.  Genitourinary: Vagina normal and uterus normal. There is no rash or tenderness on the right labia. There is no rash or tenderness on the left labia. Cervix exhibits discharge. Cervix exhibits no motion tenderness.    Musculoskeletal: Normal range of motion. She exhibits no edema.  Nursing note and vitals reviewed.      Assessment:     Possible exposure to STD. Birth control encounter     Plan:     Check problem list.

## 2014-12-24 LAB — HIV ANTIBODY (ROUTINE TESTING W REFLEX): HIV 1&2 Ab, 4th Generation: NONREACTIVE

## 2014-12-24 LAB — RPR

## 2014-12-25 ENCOUNTER — Encounter: Payer: Self-pay | Admitting: Family Medicine

## 2014-12-26 ENCOUNTER — Other Ambulatory Visit: Payer: Self-pay | Admitting: Family Medicine

## 2014-12-26 ENCOUNTER — Telehealth: Payer: Self-pay | Admitting: Family Medicine

## 2014-12-26 LAB — CERVICOVAGINAL ANCILLARY ONLY
CHLAMYDIA, DNA PROBE: NEGATIVE
NEISSERIA GONORRHEA: NEGATIVE

## 2014-12-26 MED ORDER — ALBUTEROL SULFATE HFA 108 (90 BASE) MCG/ACT IN AERS: 2.0000 | INHALATION_SPRAY | RESPIRATORY_TRACT | Status: DC | PRN

## 2014-12-26 NOTE — Telephone Encounter (Signed)
Pt called and would like the results of her STD test. She also wanted to know where her medications are. Myriam Jacobsonjw

## 2014-12-26 NOTE — Telephone Encounter (Signed)
Family Medicine After hours phone call  Received phone call from pt stating she was seen in clinic on Friday and was told she was supposed to have albuterol and diflucan sent to pharmacy, but only albuterol was sent. I reviewed the results of the wet prep (normal) with her and that since I was not the provider who saw her and no indication of diflucan being prescribed I would not be able to send this over the phone. Did offer that if she felt she had a yeast infection she could try and use OTC monistat cream, and if still not improved can return to clinic for retest. Pt was appreciative and stated she would go to urgent care.  Tawni CarnesAndrew Mikylah Ackroyd, MD 12/26/2014, 5:31 PM PGY-2, [facility]

## 2014-12-26 NOTE — Telephone Encounter (Signed)
Spoke with patient and informed her of negative results.  She states that she was told she had a yeast infection and that medication for that would be called in and a refill on her albuterol. Emmanuel Gruenhagen,CMA

## 2014-12-26 NOTE — Telephone Encounter (Signed)
Patient was not informed she will be given diflucan, I am not sure who gave her that information, she does not have yeast infection. Thank you for informing me.

## 2014-12-26 NOTE — Telephone Encounter (Signed)
Not sure who told her she has yeast infection. Her test were negative like you rightly noted. I will send her albuterol to pharmacy. She does not need any other medication. Thanks.

## 2014-12-26 NOTE — Telephone Encounter (Signed)
Ok thanks. Kanye Depree,CMA  

## 2014-12-27 ENCOUNTER — Encounter (HOSPITAL_COMMUNITY): Payer: Self-pay | Admitting: Emergency Medicine

## 2014-12-27 ENCOUNTER — Emergency Department (HOSPITAL_COMMUNITY)
Admission: EM | Admit: 2014-12-27 | Discharge: 2014-12-28 | Disposition: A | Discharge status: Home / Self Care | Payer: Medicaid Other | Attending: Emergency Medicine | Admitting: Emergency Medicine

## 2014-12-27 DIAGNOSIS — Z8659 Personal history of other mental and behavioral disorders: Secondary | ICD-10-CM | POA: Insufficient documentation

## 2014-12-27 DIAGNOSIS — J45909 Unspecified asthma, uncomplicated: Secondary | ICD-10-CM | POA: Diagnosis not present

## 2014-12-27 DIAGNOSIS — Z880 Allergy status to penicillin: Secondary | ICD-10-CM | POA: Insufficient documentation

## 2014-12-27 DIAGNOSIS — N898 Other specified noninflammatory disorders of vagina: Secondary | ICD-10-CM | POA: Insufficient documentation

## 2014-12-27 DIAGNOSIS — Z79899 Other long term (current) drug therapy: Secondary | ICD-10-CM | POA: Insufficient documentation

## 2014-12-27 DIAGNOSIS — Z7951 Long term (current) use of inhaled steroids: Secondary | ICD-10-CM | POA: Diagnosis not present

## 2014-12-27 MED ORDER — FLUCONAZOLE 150 MG PO TABS
150.0000 mg | ORAL_TABLET | Freq: Once | ORAL | Status: AC
  Administered 2014-12-27: 150 mg via ORAL
  Filled 2014-12-27: qty 1

## 2014-12-27 NOTE — ED Notes (Signed)
Pt was seen at gyn on last Friday and dx with yeast infection. Pt MD was suppose to have med sent to pharmacy and per pt med never arrived.

## 2014-12-27 NOTE — ED Provider Notes (Signed)
CSN: [id number]     Arrival date & time 12/27/14  2231 History   First MD Initiated Contact with Patient 12/27/14 2314     Chief Complaint  Patient presents with  . Vaginitis     (Consider location/radiation/quality/duration/timing/severity/associated sxs/prior Treatment) HPI   19 year old female with prior history of PID her recurrent yeast infection presenting for complaints of vaginal irritation. Patient reports 4 days ago she noticed vaginal itching, and cottage cheese discharge which felt similar to prior yeast infection. She was seen by her primary care provider and did had a pelvic examination. She was told that she has these infection and was supposed to receive a dose of Diflucan but states that she did not receive her medication. Since then she has increased vaginal discomfort and she is concerned of untreated yeast infection. She has called the office multiple times requesting treatment but has not received any treatment. She denies having any fever, chills, abdominal pain, back pain, dysuria, hematuria, vaginal bleeding, or rash. Denies any pain with sexual activities. She is here requesting for treatment of yeast infection. She denies any his change in soap, detergent, or body wash.  Past Medical History  Diagnosis Date  . Asthma   . History of PID   . Depression   . Pregnancy, multiple    Past Surgical History  Procedure Laterality Date  . No past surgeries    . Tonsillectomy    . Addenoidectomy    . Cesarean section N/A 11/18/2013    Procedure: Primary Cesarean Section Delivery Baby "A" Girl @ 0447, Apgars 1/2/4, Baby "B" @ 0450, Apgars 8/9;  Surgeon: Kathreen CosierBernard A Marshall, MD;  Location: [facility];  Service: Obstetrics;  Laterality: N/A;   Family History  Problem Relation Age of Onset  . Asthma Other   . Diabetes Other   . Cancer Other   . Sickle cell anemia Mother    History  Substance Use Topics  . Smoking status: Never Smoker   . Smokeless tobacco: Never Used  .  Alcohol Use: No   OB History    Gravida Para Term Preterm AB TAB SAB Ectopic Multiple Living   1 1 1  0 0 0 0 0 1 2     Review of Systems  Constitutional: Negative for fever.  Genitourinary: Positive for vaginal discharge. Negative for dysuria, vaginal pain and pelvic pain.  Skin: Negative for rash.      Allergies  Penicillins  Home Medications   Prior to Admission medications   Medication Sig Start Date End Date Taking? Authorizing Provider  albuterol (PROVENTIL HFA) 108 (90 BASE) MCG/ACT inhaler Inhale 2 puffs into the lungs every 4 (four) hours as needed for wheezing or shortness of breath. 12/26/14   Doreene ElandKehinde T Eniola, MD  fluticasone (FLONASE) 50 MCG/ACT nasal spray Place 2 sprays into both nostrils daily. Patient not taking: Reported on 12/23/2014 10/24/14   Dahlia ClientHannah Muthersbaugh, [name]   BP 96/67 mmHg  Pulse 82  Temp(Src) 98 F (36.7 C) (Oral)  Resp 15  SpO2 100%  LMP 11/27/2014 (Exact Date) Physical Exam  Constitutional: She appears well-developed and well-nourished. No distress.  Moderately obese African-American female appears to be in no acute distress, smiling, conversant.  HENT:  Head: Atraumatic.  Eyes: Conjunctivae are normal.  Neck: Neck supple.  Abdominal: Soft. There is no tenderness.  Genitourinary:  Pelvic examination is deferred.  Neurological: She is alert.  Skin: No rash noted.  Psychiatric: She has a normal mood and affect.  Nursing note and  vitals reviewed.   ED Course  Procedures (including critical care time)  Patient here with complaints suggestive of vaginal yeast infection. She was seen by her PCP 4 days ago for the same complaint I have reviewed prior record. No evidence of pregnancy, yeast infection, trichomonas, gonorrhea chlamydia HIV or syphilis. She has no finding for STD. She however complaining of worsening symptoms since then and is really concerned of yeast infection. At this time I felt that given patient 1 dose of Diflucan 150 mg by  mouth would be appropriate given her recurrent infection. No pelvic examination performed today as she has a recent evaluation less than a week ago, and has no abdominal discomfort exam. She is afebrile with stable normal vital sign.  Patient agrees to follow-up with PCP for further management.  No sexual activity for the past 4 days since last pelvic exam and negative pregnancy.  Doubt pregnancy therefore diflucan is not contraindicated.  Labs Review Labs Reviewed - No data to display  Imaging Review No results found.   EKG Interpretation None      MDM   Final diagnoses:  Vaginal irritation    BP 96/67 mmHg  Pulse 82  Temp(Src) 98 F (36.7 C) (Oral)  Resp 15  SpO2 100%  LMP 11/27/2014 (Exact Date)     Fayrene Helper, [name] 12/27/14 2358  Loren Racer, MD 12/28/14 [phone]

## 2014-12-27 NOTE — Discharge Instructions (Signed)
Follow up with your doctor if your symptoms persists.

## 2014-12-27 NOTE — ED Notes (Signed)
Called pt x1 for room placement with no response.

## 2015-03-14 ENCOUNTER — Encounter: Payer: Self-pay | Admitting: Family Medicine

## 2015-03-14 ENCOUNTER — Ambulatory Visit: Payer: Medicaid Other

## 2015-03-14 ENCOUNTER — Ambulatory Visit (INDEPENDENT_AMBULATORY_CARE_PROVIDER_SITE_OTHER): Payer: Medicaid Other | Admitting: Family Medicine

## 2015-03-14 VITALS — BP 140/70 | HR 97 | Temp 98.6°F | Ht 63.0 in | Wt 299.6 lb

## 2015-03-14 DIAGNOSIS — Z3042 Encounter for surveillance of injectable contraceptive: Secondary | ICD-10-CM

## 2015-03-14 DIAGNOSIS — N898 Other specified noninflammatory disorders of vagina: Secondary | ICD-10-CM | POA: Diagnosis present

## 2015-03-14 LAB — POCT WET PREP (WET MOUNT): CLUE CELLS WET PREP WHIFF POC: NEGATIVE

## 2015-03-14 LAB — POCT UA - MICROSCOPIC ONLY

## 2015-03-14 LAB — POCT URINALYSIS DIPSTICK
Bilirubin, UA: NEGATIVE
Blood, UA: NEGATIVE
Glucose, UA: NEGATIVE
KETONES UA: NEGATIVE
Nitrite, UA: NEGATIVE
PH UA: 6.5
Protein, UA: NEGATIVE
Spec Grav, UA: >=1.03
UROBILINOGEN UA: 0.2

## 2015-03-14 MED ORDER — FLUCONAZOLE 150 MG PO TABS: 150.0000 mg | ORAL_TABLET | Freq: Once | ORAL | Status: DC

## 2015-03-14 MED ORDER — ETONOGESTREL 68 MG ~~LOC~~ IMPL: 68.0000 mg | DRUG_IMPLANT | Freq: Once | SUBCUTANEOUS | Status: DC

## 2015-03-14 MED ORDER — MEDROXYPROGESTERONE ACETATE 150 MG/ML IM SUSP
150.0000 mg | Freq: Once | INTRAMUSCULAR | Status: AC
  Administered 2015-03-14: 150 mg via INTRAMUSCULAR

## 2015-03-14 NOTE — Progress Notes (Signed)
   Subjective:    Patient ID: Lisa Marshall, female    DOB: 08-18-1996, 19 y.o.   MRN: [id number]  Seen for Same day visit for   CC: vaginal discharge  VAGINAL DISCHARGE  Onset: 1.5 weeks ago Description: hx of bv and similar symptoms Odor: yes  Itching: yes   Symptoms Dysuria: no  Bleeding: no  Pelvic pain: no  Back pain: no  Fever: no  Genital sores: no  Rash: no  Dyspareunia: no  GI Sxs: no  Prior treatment: yes   Red Flags: Missed period: no  Pregnancy: no  Recent antibiotics: no  Sexual activity: no  Possible STD exposure: no  IUD: no  Diabetes: no    PMH - Smoking status noted.    Review of Systems   See HPI for ROS. Objective:  BP 140/70 mmHg  Pulse 97  Temp(Src) 98.6 F (37 C) (Oral)  Ht 5\' 3"  (1.6 m)  Wt 299 lb 9.6 oz (135.898 kg)  BMI 53.09 kg/m2  General: NAD Abdomen: soft, nontender, nondistended, no hepatic or splenomegaly. Bowel sounds present. No suprapubic tenderness  MSK: no CVA tenderess  Skin: warm and dry, no rashes noted Neuro: alert and oriented, no focal deficits GU: > External: no lesions     Assessment & Plan:  See Problem List Documentation

## 2015-03-14 NOTE — Assessment & Plan Note (Signed)
Symptoms similar to prior episodes. Wet prep c/w yeast.  UA showing moderate leukocytes but no suprapubic pain or CVA.  - Wet prep: yeast  - diflucan 150 mg x 1  - UA: mod. Leukocytes  - sent for urine cx  - called patient with treatment plan

## 2015-03-14 NOTE — Patient Instructions (Signed)
Thank you for coming in,   I will call you with the results from today.   Please bring all of your medications with you to each visit.    Please feel free to call with any questions or concerns at any time, at [phone]. --Dr. Petro Talent  

## 2015-03-16 LAB — URINE CULTURE: Colony Count: 25000

## 2015-06-24 ENCOUNTER — Emergency Department (HOSPITAL_COMMUNITY)
Admission: EM | Admit: 2015-06-24 | Discharge: 2015-06-24 | Disposition: A | Discharge status: Home / Self Care | Payer: Medicaid Other | Attending: Emergency Medicine | Admitting: Emergency Medicine

## 2015-06-24 ENCOUNTER — Encounter (HOSPITAL_COMMUNITY): Payer: Self-pay | Admitting: *Deleted

## 2015-06-24 DIAGNOSIS — Z79899 Other long term (current) drug therapy: Secondary | ICD-10-CM | POA: Insufficient documentation

## 2015-06-24 DIAGNOSIS — Z8659 Personal history of other mental and behavioral disorders: Secondary | ICD-10-CM | POA: Insufficient documentation

## 2015-06-24 DIAGNOSIS — R109 Unspecified abdominal pain: Secondary | ICD-10-CM

## 2015-06-24 DIAGNOSIS — N898 Other specified noninflammatory disorders of vagina: Secondary | ICD-10-CM | POA: Insufficient documentation

## 2015-06-24 DIAGNOSIS — Z880 Allergy status to penicillin: Secondary | ICD-10-CM | POA: Diagnosis not present

## 2015-06-24 DIAGNOSIS — Z3202 Encounter for pregnancy test, result negative: Secondary | ICD-10-CM | POA: Diagnosis not present

## 2015-06-24 DIAGNOSIS — J45909 Unspecified asthma, uncomplicated: Secondary | ICD-10-CM | POA: Insufficient documentation

## 2015-06-24 DIAGNOSIS — Z7951 Long term (current) use of inhaled steroids: Secondary | ICD-10-CM | POA: Insufficient documentation

## 2015-06-24 LAB — CBC
HEMATOCRIT: 34.5 % — AB (ref 36.0–46.0)
Hemoglobin: 11.3 g/dL — ABNORMAL LOW (ref 12.0–15.0)
MCH: 26.5 pg (ref 26.0–34.0)
MCHC: 32.8 g/dL (ref 30.0–36.0)
MCV: 81 fL (ref 78.0–100.0)
PLATELETS: 390 10*3/uL (ref 150–400)
RBC: 4.26 MIL/uL (ref 3.87–5.11)
RDW: 14.6 % (ref 11.5–15.5)
WBC: 4.8 10*3/uL (ref 4.0–10.5)

## 2015-06-24 LAB — COMPREHENSIVE METABOLIC PANEL
ALT: 24 U/L (ref 14–54)
AST: 20 U/L (ref 15–41)
Albumin: 3.9 g/dL (ref 3.5–5.0)
Alkaline Phosphatase: 100 U/L (ref 38–126)
Anion gap: 7 (ref 5–15)
BILIRUBIN TOTAL: 0.3 mg/dL (ref 0.3–1.2)
BUN: 12 mg/dL (ref 6–20)
CHLORIDE: 108 mmol/L (ref 101–111)
CO2: 23 mmol/L (ref 22–32)
CREATININE: 0.52 mg/dL (ref 0.44–1.00)
Calcium: 9.1 mg/dL (ref 8.9–10.3)
GFR calc Af Amer: >60 mL/min (ref >60)
GFR calc non Af Amer: >60 mL/min (ref >60)
GLUCOSE: 114 mg/dL — AB (ref 65–99)
POTASSIUM: 3.6 mmol/L (ref 3.5–5.1)
Sodium: 138 mmol/L (ref 135–145)
Total Protein: 7.6 g/dL (ref 6.5–8.1)

## 2015-06-24 LAB — URINALYSIS, ROUTINE W REFLEX MICROSCOPIC
Bilirubin Urine: NEGATIVE
GLUCOSE, UA: NEGATIVE mg/dL
Hgb urine dipstick: NEGATIVE
KETONES UR: NEGATIVE mg/dL
Nitrite: NEGATIVE
Protein, ur: NEGATIVE mg/dL
SPECIFIC GRAVITY, URINE: 1.025 (ref 1.005–1.030)
Urobilinogen, UA: 1 mg/dL (ref 0.0–1.0)
pH: 6.5 (ref 5.0–8.0)

## 2015-06-24 LAB — URINE MICROSCOPIC-ADD ON

## 2015-06-24 LAB — LIPASE, BLOOD: LIPASE: 31 U/L (ref 11–51)

## 2015-06-24 LAB — POC URINE PREG, ED: Preg Test, Ur: NEGATIVE

## 2015-06-24 MED ORDER — FLUCONAZOLE 150 MG PO TABS: 150.0000 mg | ORAL_TABLET | Freq: Once | ORAL | Status: DC

## 2015-06-24 NOTE — Discharge Instructions (Signed)
Take the prescribed medication as directed. °Follow-up with your primary care physician. °Return to the ED for new or worsening symptoms. ° °

## 2015-06-24 NOTE — ED Notes (Signed)
Pt indicates epigastric pain when eating.

## 2015-06-24 NOTE — ED Provider Notes (Signed)
CSN: [id number]     Arrival date & time 06/24/15  1723 History   First MD Initiated Contact with Patient 06/24/15 1800     Chief Complaint  Patient presents with  . Abdominal Pain  . Vaginal Discharge     (Consider location/radiation/quality/duration/timing/severity/associated sxs/prior Treatment) Patient is a 19 y.o. female presenting with abdominal pain and vaginal discharge. The history is provided by the patient and medical records.  Abdominal Pain Associated symptoms: vaginal discharge   Vaginal Discharge Associated symptoms: abdominal pain      19 year old female here with intermittent abdominal pain and vaginal discharge. Patient states for the past month she has been having epigastric pain after eating. She denies any nausea or vomiting when eating.  No fever or chills. She states her bowel movements are irregular which she attributes to her diet.  Last BM was 2 days ago.  She states 2 days ago she developed vaginal itching and vaginal discharge which appearedon:p. she states she feels that she has a yeast infection. She denies any new sexual partners or concern for STD. No dysuria. Patient was previously on Depo-Provera, however missed her last shot. She has not had a menstrual cycle since this time.  VSS.  Past Medical History  Diagnosis Date  . Asthma   . History of PID   . Depression   . Pregnancy, multiple    Past Surgical History  Procedure Laterality Date  . No past surgeries    . Tonsillectomy    . Addenoidectomy    . Cesarean section N/A 11/18/2013    Procedure: Primary Cesarean Section Delivery Baby "A" Girl @ 0447, Apgars 1/2/4, Baby "B" @ 0450, Apgars 8/9;  Surgeon: Kathreen CosierBernard A Marshall, MD;  Location: [facility];  Service: Obstetrics;  Laterality: N/A;   Family History  Problem Relation Age of Onset  . Asthma Other   . Diabetes Other   . Cancer Other   . Sickle cell anemia Mother    Social History  Substance Use Topics  . Smoking status: Never Smoker   .  Smokeless tobacco: Never Used  . Alcohol Use: No   OB History    Gravida Para Term Preterm AB TAB SAB Ectopic Multiple Living   1 1 1  0 0 0 0 0 1 2     Review of Systems  Gastrointestinal: Positive for abdominal pain.  Genitourinary: Positive for vaginal discharge.  All other systems reviewed and are negative.     Allergies  Penicillins  Home Medications   Prior to Admission medications   Medication Sig Start Date End Date Taking? Authorizing Provider  albuterol (PROVENTIL HFA) 108 (90 BASE) MCG/ACT inhaler Inhale 2 puffs into the lungs every 4 (four) hours as needed for wheezing or shortness of breath. 12/26/14   Doreene ElandKehinde T Eniola, MD  fluconazole (DIFLUCAN) 150 MG tablet Take 1 tablet (150 mg total) by mouth once. 03/14/15   Myra RudeJeremy E Schmitz, MD  fluticasone (FLONASE) 50 MCG/ACT nasal spray Place 2 sprays into both nostrils daily. 10/24/14   Hannah Muthersbaugh, [name]   BP 129/79 mmHg  Pulse 95  Temp(Src) 98.2 F (36.8 C) (Oral)  Resp 15  SpO2 94%   Physical Exam  Constitutional: She is oriented to person, place, and time. She appears well-developed and well-nourished. No distress.  HENT:  Head: Normocephalic and atraumatic.  Mouth/Throat: Oropharynx is clear and moist.  Eyes: Conjunctivae and EOM are normal. Pupils are equal, round, and reactive to light.  Neck: Normal range of motion. Neck  supple.  Cardiovascular: Normal rate, regular rhythm and normal heart sounds.   Pulmonary/Chest: Effort normal and breath sounds normal. No respiratory distress. She has no wheezes.  Abdominal: Soft. Bowel sounds are normal. There is no tenderness. There is no guarding and no CVA tenderness.  Musculoskeletal: Normal range of motion. She exhibits no edema.  Neurological: She is alert and oriented to person, place, and time.  Skin: Skin is warm. She is not diaphoretic.  Psychiatric: She has a normal mood and affect.  Nursing note and vitals reviewed.   ED Course  Procedures  (including critical care time) Labs Review Labs Reviewed  COMPREHENSIVE METABOLIC PANEL - Abnormal; Notable for the following:    Glucose, Bld 114 (*)    All other components within normal limits  CBC - Abnormal; Notable for the following:    Hemoglobin 11.3 (*)    HCT 34.5 (*)    All other components within normal limits  URINALYSIS, ROUTINE W REFLEX MICROSCOPIC (NOT AT [facility]) - Abnormal; Notable for the following:    APPearance CLOUDY (*)    Leukocytes, UA MODERATE (*)    All other components within normal limits  URINE MICROSCOPIC-ADD ON - Abnormal; Notable for the following:    Squamous Epithelial / LPF FEW (*)    Bacteria, UA FEW (*)    All other components within normal limits  LIPASE, BLOOD  POC URINE PREG, ED    Imaging Review No results found. I have personally reviewed and evaluated these images and lab results as part of my medical decision-making.   EKG Interpretation None      MDM   Final diagnoses:  Abdominal pain, unspecified abdominal location  Vaginal discharge   19 year old female here with abdominal pain and vaginal discharge. Abdominal pain has been intermittent over the past month, only occurring after eating. She states she feels "full". She does have irregular bowel movements as well. Her abdominal exam is benign here and lab work is reassuring. May be some component of constipation contributing to her symptoms. I recommended stool softener for this. Patient also with vaginal discharge which she describes as irritation. She states she feels she has a yeast infection. She states she does not want a pelvic exam. She does not have concern for STD at this time. Will start on course of Diflucan. Patient is to follow-up with her PCP.  Discussed plan with patient, he/she acknowledged understanding and agreed with plan of care.  Return precautions given for new or worsening symptoms.  Garlon Hatchet, [name] 06/24/15 1952  Rolland Porter, MD 07/12/15 [phone]

## 2015-06-24 NOTE — ED Notes (Signed)
Pt reports abdominal pain and vaginal discharge since last night. Pain last night 10/10. Pt missed her Depo shot that was due on 10/14, reports she has not been sexually active. denies pain at present. Denies N/V.

## 2015-07-23 ENCOUNTER — Encounter (HOSPITAL_COMMUNITY): Payer: Self-pay | Admitting: Emergency Medicine

## 2015-07-23 ENCOUNTER — Emergency Department (INDEPENDENT_AMBULATORY_CARE_PROVIDER_SITE_OTHER)
Admission: EM | Admit: 2015-07-23 | Discharge: 2015-07-23 | Disposition: A | Discharge status: Home / Self Care | Payer: Medicaid Other | Source: Home / Self Care | Attending: Emergency Medicine | Admitting: Emergency Medicine

## 2015-07-23 DIAGNOSIS — A084 Viral intestinal infection, unspecified: Secondary | ICD-10-CM | POA: Diagnosis not present

## 2015-07-23 MED ORDER — ONDANSETRON HCL 4 MG PO TABS: 4.0000 mg | ORAL_TABLET | Freq: Three times a day (TID) | ORAL | Status: DC | PRN

## 2015-07-23 NOTE — ED Notes (Signed)
C/o emesis and diarrhea onset yest; sx have subsided A&O x4... No acute distress.

## 2015-07-23 NOTE — Discharge Instructions (Signed)
You likely had a virus. The diarrhea should improve over the next 2-3 days. Make sure you are drinking lots of fluids. Use the Zofran every 8 hours as needed for nausea. This will likely spread through your household. Follow-up as needed.

## 2015-07-23 NOTE — ED Provider Notes (Signed)
CSN: [id number]     Arrival date & time 07/23/15  1344 History   First MD Initiated Contact with Patient 07/23/15 1507     Chief Complaint  Patient presents with  . Emesis  . Diarrhea   (Consider location/radiation/quality/duration/timing/severity/associated sxs/prior Treatment) HPI  She is a 19 year old woman here for evaluation of vomiting and diarrhea. She states she developed fevers, nausea, vomiting, and watery diarrhea yesterday. Today, the vomiting has resolved. She does continue to have diarrhea any time she eats. She states her mouth and throat feel very dry, but she is able to tolerate fluids well at this time. She denies any blood in the vomit or stool. She has 2 children at home as well as family members that were here for the holiday weekend. Some of them are starting to feel sick as well.  Past Medical History  Diagnosis Date  . Asthma   . History of PID   . Depression   . Pregnancy, multiple    Past Surgical History  Procedure Laterality Date  . No past surgeries    . Tonsillectomy    . Addenoidectomy    . Cesarean section N/A 11/18/2013    Procedure: Primary Cesarean Section Delivery Baby "A" Girl @ 0447, Apgars 1/2/4, Baby "B" @ 0450, Apgars 8/9;  Surgeon: Kathreen Cosier, MD;  Location: [facility];  Service: Obstetrics;  Laterality: N/A;   Family History  Problem Relation Age of Onset  . Asthma Other   . Diabetes Other   . Cancer Other   . Sickle cell anemia Mother    Social History  Substance Use Topics  . Smoking status: Never Smoker   . Smokeless tobacco: Never Used  . Alcohol Use: No   OB History    Gravida Para Term Preterm AB TAB SAB Ectopic Multiple Living   0 0 0 0 0 1 2     Review of Systems As in history of present illness Allergies  Penicillins  Home Medications   Prior to Admission medications   Medication Sig Start Date End Date Taking? Authorizing Provider  albuterol (PROVENTIL HFA) 108 (90 BASE) MCG/ACT inhaler Inhale 2 puffs  into the lungs every 4 (four) hours as needed for wheezing or shortness of breath. 12/26/14   Doreene Eland, MD  fluconazole (DIFLUCAN) 150 MG tablet Take 1 tablet (150 mg total) by mouth once. Repeat in 72 hours if needed. 06/24/15   Garlon Hatchet, [name]  fluticasone (FLONASE) 50 MCG/ACT nasal spray Place 2 sprays into both nostrils daily. 10/24/14   Hannah Muthersbaugh, [name]  ondansetron (ZOFRAN) 4 MG tablet Take 1 tablet (4 mg total) by mouth every 8 (eight) hours as needed for nausea or vomiting. 07/23/15   Charm Rings, MD   Meds Ordered and Administered this Visit  Medications - No data to display  BP 109/75 mmHg  Pulse 81  Temp(Src) 97.2 F (36.2 C) (Oral)  Resp 16  SpO2 98%  Breastfeeding? No No data found.   Physical Exam  Constitutional: She is oriented to person, place, and time. She appears well-developed and well-nourished. No distress.  HENT:  Oral mucosa is dry.  Cardiovascular: Normal rate.   Pulmonary/Chest: Effort normal.  Abdominal: Soft. Bowel sounds are normal. She exhibits no distension and no mass. There is no tenderness. There is no rebound and no guarding.  Neurological: She is alert and oriented to person, place, and time.    ED Course  Procedures (including critical care  time)  Labs Review Labs Reviewed - No data to display  Imaging Review No results found.    MDM   1. Viral gastroenteritis    Resolving gastroenteritis. Emphasized importance of fluid intake. Perception for Zofran given to use as needed for nausea. Follow-up as needed.    Charm RingsErin J Evo Aderman, MD 07/23/15 [phone]

## 2015-08-04 ENCOUNTER — Emergency Department (HOSPITAL_COMMUNITY): Payer: Medicaid Other

## 2015-08-04 ENCOUNTER — Emergency Department (HOSPITAL_COMMUNITY)
Admission: EM | Admit: 2015-08-04 | Discharge: 2015-08-05 | Disposition: A | Discharge status: Home / Self Care | Payer: Medicaid Other | Attending: Emergency Medicine | Admitting: Emergency Medicine

## 2015-08-04 DIAGNOSIS — Z8659 Personal history of other mental and behavioral disorders: Secondary | ICD-10-CM | POA: Diagnosis not present

## 2015-08-04 DIAGNOSIS — Z8742 Personal history of other diseases of the female genital tract: Secondary | ICD-10-CM | POA: Insufficient documentation

## 2015-08-04 DIAGNOSIS — Z3202 Encounter for pregnancy test, result negative: Secondary | ICD-10-CM | POA: Insufficient documentation

## 2015-08-04 DIAGNOSIS — R05 Cough: Secondary | ICD-10-CM | POA: Diagnosis present

## 2015-08-04 DIAGNOSIS — J45909 Unspecified asthma, uncomplicated: Secondary | ICD-10-CM | POA: Insufficient documentation

## 2015-08-04 DIAGNOSIS — J189 Pneumonia, unspecified organism: Secondary | ICD-10-CM

## 2015-08-04 DIAGNOSIS — Z880 Allergy status to penicillin: Secondary | ICD-10-CM | POA: Insufficient documentation

## 2015-08-04 DIAGNOSIS — J159 Unspecified bacterial pneumonia: Secondary | ICD-10-CM | POA: Insufficient documentation

## 2015-08-04 DIAGNOSIS — Z79899 Other long term (current) drug therapy: Secondary | ICD-10-CM | POA: Diagnosis not present

## 2015-08-04 LAB — BASIC METABOLIC PANEL
Anion gap: 8 (ref 5–15)
BUN: 13 mg/dL (ref 6–20)
CALCIUM: 9.5 mg/dL (ref 8.9–10.3)
CO2: 25 mmol/L (ref 22–32)
CREATININE: 0.67 mg/dL (ref 0.44–1.00)
Chloride: 105 mmol/L (ref 101–111)
GFR calc Af Amer: >60 mL/min (ref >60)
GLUCOSE: 107 mg/dL — AB (ref 65–99)
POTASSIUM: 3.7 mmol/L (ref 3.5–5.1)
Sodium: 138 mmol/L (ref 135–145)

## 2015-08-04 LAB — D-DIMER, QUANTITATIVE: D-Dimer, Quant: 1.92 ug/mL-FEU — ABNORMAL HIGH (ref 0.00–0.50)

## 2015-08-04 LAB — CBC
HCT: 35.8 % — ABNORMAL LOW (ref 36.0–46.0)
Hemoglobin: 11.4 g/dL — ABNORMAL LOW (ref 12.0–15.0)
MCH: 26.3 pg (ref 26.0–34.0)
MCHC: 31.8 g/dL (ref 30.0–36.0)
MCV: 82.7 fL (ref 78.0–100.0)
PLATELETS: 430 10*3/uL — AB (ref 150–400)
RBC: 4.33 MIL/uL (ref 3.87–5.11)
RDW: 14.9 % (ref 11.5–15.5)
WBC: 6.7 10*3/uL (ref 4.0–10.5)

## 2015-08-04 LAB — POC URINE PREG, ED: Preg Test, Ur: NEGATIVE

## 2015-08-04 LAB — I-STAT TROPONIN, ED: TROPONIN I, POC: 0 ng/mL (ref 0.00–0.08)

## 2015-08-04 MED ORDER — MORPHINE SULFATE (PF) 4 MG/ML IV SOLN
4.0000 mg | Freq: Once | INTRAVENOUS | Status: AC
  Administered 2015-08-04: 4 mg via INTRAVENOUS
  Filled 2015-08-04: qty 1

## 2015-08-04 MED ORDER — IBUPROFEN 800 MG PO TABS: 800.0000 mg | ORAL_TABLET | Freq: Once | ORAL | Status: DC

## 2015-08-04 MED ORDER — IOHEXOL 350 MG/ML SOLN
100.0000 mL | Freq: Once | INTRAVENOUS | Status: AC | PRN
  Administered 2015-08-04: 100 mL via INTRAVENOUS

## 2015-08-04 NOTE — ED Provider Notes (Signed)
CSN: [id number]     Arrival date & time 08/04/15  1751 History   First MD Initiated Contact with Patient 08/04/15 1852     Chief Complaint  Patient presents with  . Panic Attack    HPI   Patient care assumed [facility]. Patient presents to the emergency room with complaints of acute onset of right anterior chest pain that has been persistent since time of onset approximately 4 hours ago. Patient reports she was at a funeral viewing her grandfather's body when she felt immediate pain to the chest, shortness of breath. Patient reports symptoms were most consistent with a panic attack with the exception of continued right anterior chest wall pain. She reports taking deep breaths causes increased pain, palpation of the chest wall causes increased pain. She has not attempted any over-the-counter therapies prior to arrival. Patient reports over last several days she's had a productive cough, not associated with shortness of breath, fever, chills, nausea, vomiting, abdominal pain. She denies any lower extremity swelling or edema, recent immobilization, recent surgery, active malignancy, hernia concerning signs or symptoms for DVT/PE. Patient does report a significant past medical history of DVT in her lower extremity while pregnant. She cannot recall any specific therapies occluding anticoagulation.  Past Medical History  Diagnosis Date  . Asthma   . History of PID   . Depression   . Pregnancy, multiple    Past Surgical History  Procedure Laterality Date  . No past surgeries    . Tonsillectomy    . Addenoidectomy    . Cesarean section N/A 11/18/2013    Procedure: Primary Cesarean Section Delivery Baby "A" Girl @ 0447, Apgars 1/2/4, Baby "B" @ 0450, Apgars 8/9;  Surgeon: Kathreen Cosier, MD;  Location: [facility];  Service: Obstetrics;  Laterality: N/A;   Family History  Problem Relation Age of Onset  . Asthma Other   . Diabetes Other   . Cancer Other   . Sickle cell anemia Mother     Social History  Substance Use Topics  . Smoking status: Never Smoker   . Smokeless tobacco: Never Used  . Alcohol Use: No   OB History    Gravida Para Term Preterm AB TAB SAB Ectopic Multiple Living   0 0 0 0 0 1 2     Review of Systems  All other systems reviewed and are negative.   Allergies  Penicillins  Home Medications   Prior to Admission medications   Medication Sig Start Date End Date Taking? Authorizing Provider  albuterol (PROVENTIL HFA;VENTOLIN HFA) 108 (90 BASE) MCG/ACT inhaler Inhale 2 puffs into the lungs every 6 (six) hours as needed for wheezing or shortness of breath.   Yes Historical Provider, MD  azithromycin (ZITHROMAX) 250 MG tablet Take 1 tablet (250 mg total) by mouth daily. Take first 2 tablets together, then 1 every day until finished. 08/05/15   Eyvonne Mechanic, [name]  fluconazole (DIFLUCAN) 150 MG tablet Take 1 tablet (150 mg total) by mouth once. Repeat in 72 hours if needed. Patient not taking: Reported on 08/04/2015 06/24/15   Garlon Hatchet, [name]  ondansetron (ZOFRAN) 4 MG tablet Take 1 tablet (4 mg total) by mouth every 8 (eight) hours as needed for nausea or vomiting. Patient not taking: Reported on 08/04/2015 07/23/15   Charm Rings, MD   BP 117/69 mmHg  Pulse 94  Temp(Src) 99.1 F (37.3 C) (Oral)  Resp 26  SpO2 98%  LMP 08/03/2014  Physical Exam  Constitutional: She is oriented to person, place, and time. She appears well-developed and well-nourished.  HENT:  Head: Normocephalic and atraumatic.  Eyes: Conjunctivae are normal. Pupils are equal, round, and reactive to light. Right eye exhibits no discharge. Left eye exhibits no discharge. No scleral icterus.  Neck: Normal range of motion. No JVD present. No tracheal deviation present.  Cardiovascular: Regular rhythm, normal heart sounds and intact distal pulses.  Exam reveals no gallop and no friction rub.   No murmur heard. Pulmonary/Chest: Effort normal. No stridor.  No obvious  deformities of the anterior chest wall or breast. Significant tenderness to palpation of the breast with no obvious abnormalities.  Abdominal: Soft. She exhibits no distension and no mass. There is no tenderness. There is no guarding.  Musculoskeletal: Normal range of motion. She exhibits no edema or tenderness.  No lower extremity swelling or edema   Neurological: She is alert and oriented to person, place, and time. Coordination normal.  Skin: Skin is warm and dry. No rash noted. No erythema. No pallor.  Psychiatric: She has a normal mood and affect. Her behavior is normal. Judgment and thought content normal.  Nursing note and vitals reviewed.    ED Course  Procedures (including critical care time) Labs Review Labs Reviewed  BASIC METABOLIC PANEL - Abnormal; Notable for the following:    Glucose, Bld 107 (*)    All other components within normal limits  CBC - Abnormal; Notable for the following:    Hemoglobin 11.4 (*)    HCT 35.8 (*)    Platelets 430 (*)    All other components within normal limits  D-DIMER, QUANTITATIVE (NOT AT [facility]) - Abnormal; Notable for the following:    D-Dimer, Quant 1.92 (*)    All other components within normal limits  I-STAT TROPOININ, ED  Rosezena Sensor, ED  POC URINE PREG, ED    Imaging Review Dg Chest 2 View  08/04/2015  CLINICAL DATA:  Right-sided chest pain and shortness of breath. Asthma. EXAM: CHEST  2 VIEW COMPARISON:  10/23/2014 FINDINGS: The heart size and mediastinal contours are within normal limits. New opacity is seen in the medial right lung base, which may be due to atelectasis or pneumonia. No evidence of pneumothorax or pleural effusion. IMPRESSION: Atelectasis versus pneumonia in the medial right lung base. Electronically Signed   By: Myles Rosenthal M.D.   On: 08/04/2015 20:01   Ct Angio Chest Pe W/cm &/or Wo Cm  08/04/2015  CLINICAL DATA:  Right-sided chest pain for 4 hours. EXAM: CT ANGIOGRAPHY CHEST WITH CONTRAST TECHNIQUE:  Multidetector CT imaging of the chest was performed using the standard protocol during bolus administration of intravenous contrast. Multiplanar CT image reconstructions and MIPs were obtained to evaluate the vascular anatomy. CONTRAST:  OMNIPAQUE IOHEXOL 350 MG/ML SOLN. A second injection with 100 mL Omnipaque 350 performed due to suboptimal first contrast bolus. COMPARISON:  Chest radiographs earlier this day. FINDINGS: Examination is nondiagnostic for evaluation of pulmonary embolus despite repeat injection due to contrast bolus timing and soft tissue attenuation from body habitus. Heart is normal in size. Thoracic aorta is normal in caliber. Soft tissue density anterior mediastinum consistent with residual thymus. No mediastinal adenopathy. Consolidation in the anterior right middle lobe with air bronchograms. Patchy opacity in the right perihilar lung. Left lung is clear. No evidence of acute abnormality in the included upper abdomen. There are no acute or suspicious osseous abnormalities. Review of the MIP images confirms the above findings.  IMPRESSION: 1. Nondiagnostic for evaluation of pulmonary embolus due to contrast bolus timing and soft tissue attenuation from body habitus. 2. Consolidation in the anterior right middle lobe. Patchy opacities in the right perihilar lung. Suspect infectious etiology with pneumonia and pneumonitis. Electronically Signed   By: Rubye OaksMelanie  Ehinger M.D.   On: 08/04/2015 22:58   I have personally reviewed and evaluated these images and lab results as part of my medical decision-making.   EKG Interpretation   Date/Time:  Friday August 04 2015 18:43:08 EST Ventricular Rate:  97 PR Interval:  150 QRS Duration: 79 QT Interval:  333 QTC Calculation: 423 R Axis:   22 Text Interpretation:  Sinus rhythm Low voltage, precordial leads Confirmed  by MESNER MD, Barbara CowerJASON [phone] on 08/04/2015 7:09:08 PM      MDM   Final diagnoses:  Community acquired pneumonia     Labs: 20 care urine pregnant, i-STAT troponin, BMP, CBC, d-dimer- significant for d-dimer 1.92  Imaging: DG chest 2 view shows atelectasis versus pneumonia in the right middle lung base / ED EKG  Consults:  Therapeutics: morphine, azithromycin  Discharge Meds: azithromycin   Assessment/Plan: 19 year old female presents today with right anterior chest wall pain which is most consistent with findings of pneumonia in the right middle lung base. Patient productive cough, chest pain located over the area of pneumonia. Patient has reassuring laboratory results and vital signs. She is tolerating by mouth and will be treated as an outpatient. No recent antibiotic exposure, no hospitalizations. Chest x-ray showed atelectasis versus pneumonia, CT PE study performed to rule out DVT, radiology reports to attempts at PE study with inconclusive results. Based on present findings low suspicion for PE with pneumonia most likely. Patient informed of findings, she was given a dose of azithromycin here in the ED he will be discharged home with antibiotics for 4 days. Patient is instructed follow-up with her primary care provider Monday for reevaluation, if any new or worsening signs or symptoms present she is instructed to return to emergency room for further evaluation and management. Patient verbalized understanding and agreement for today's plan and no further questions or concerns at time of discharge.         Eyvonne MechanicJeffrey Titania Gault, [name] 08/05/15 0022  Marily MemosJason Mesner, MD 08/05/15 [phone]

## 2015-08-04 NOTE — ED Notes (Addendum)
Pt reports she had a panic attack after viewing her grandpas dead body. Pt reports right sided chest pain and SOB. Pain 10/10.

## 2015-08-04 NOTE — ED Provider Notes (Signed)
HPI Linton HamMontrenia S Mac is a 19 y.o. morbidly obese female who presents to the Emergency Department complaining of moderate, sharp, right-sided chest pain that began about four hours ago. She reports she went to view her grandfather's dead body at the funeral home, became overwhelmed and fell to the ground. Pt states she believes she had a panic attack. She reports productive cough of mucus. Deep breathing increases the CP. Taking short, shallow breaths helps to decrease the pain. She has not taken anything to treat her symptoms. She denies LOC, head trauma, nausea, vomiting, abdominal pain, fever, neck stiffness or leg swelling. She denies any recent surgery, travel or trauma. She reports PMHx of DVT in an unspecified lower extremity while pregnant. She denies being on anticoagulant therapy.    Physical Exam General appearance: WDWN, NAD Skin: Warm and dry Head: atraumatic  Neck: Supple and symmetric. Lungs: Effort normal, no retractions, no use of accessory muscles Cardiovascular: RRR without murmurs Abdomen: Soft, nondistended, nontender with normal bowel sounds Extremities: No cyanosis, clubbing, or edema Neurological: AOx3.  Normal affect.  MDM Patient presents with right sided chest pain.  Hx of DVT.  Concern for cardiac etiology vs PE vs musculoskeletal vs psych component.  Appropriate labs and imagin have been ordered.  Patient has been transferred over to acute side at this time.  Patient care assumed by Burna FortsJeff Hedges, [name] who will follow up on labs and imaging.  I personally performed the services described in this documentation, which was scribed in my presence. The recorded information has been reviewed and is accurate.      Cheri FowlerKayla Caelin Rosen, [name] 08/04/15 1940  Marily MemosJason Mesner, MD 08/04/15 2238

## 2015-08-04 NOTE — ED Notes (Signed)
Patient transported to CT 

## 2015-08-04 NOTE — ED Notes (Signed)
Patient transported to X-ray 

## 2015-08-05 MED ORDER — IBUPROFEN 800 MG PO TABS
800.0000 mg | ORAL_TABLET | Freq: Once | ORAL | Status: DC
Start: 2015-08-05 — End: 2015-08-05

## 2015-08-05 MED ORDER — AZITHROMYCIN 250 MG PO TABS: 250.0000 mg | ORAL_TABLET | Freq: Every day | ORAL | Status: DC

## 2015-08-05 MED ORDER — ACETAMINOPHEN 325 MG PO TABS: 650.0000 mg | ORAL_TABLET | Freq: Once | ORAL | Status: DC

## 2015-08-05 MED ORDER — AZITHROMYCIN 250 MG PO TABS
500.0000 mg | ORAL_TABLET | Freq: Once | ORAL | Status: AC
  Administered 2015-08-05: 500 mg via ORAL
  Filled 2015-08-05: qty 2

## 2015-08-05 NOTE — Discharge Instructions (Signed)

## 2015-08-18 ENCOUNTER — Ambulatory Visit: Payer: Medicaid Other | Admitting: Family Medicine

## 2015-09-01 ENCOUNTER — Encounter: Payer: Medicaid Other | Admitting: Family Medicine

## 2015-09-09 ENCOUNTER — Emergency Department (HOSPITAL_COMMUNITY)
Admission: EM | Admit: 2015-09-09 | Discharge: 2015-09-09 | Disposition: A | Discharge status: Home / Self Care | Payer: Medicaid Other | Attending: Emergency Medicine | Admitting: Emergency Medicine

## 2015-09-09 ENCOUNTER — Encounter (HOSPITAL_COMMUNITY): Payer: Self-pay | Admitting: *Deleted

## 2015-09-09 DIAGNOSIS — Z8742 Personal history of other diseases of the female genital tract: Secondary | ICD-10-CM | POA: Insufficient documentation

## 2015-09-09 DIAGNOSIS — Z760 Encounter for issue of repeat prescription: Secondary | ICD-10-CM | POA: Insufficient documentation

## 2015-09-09 DIAGNOSIS — J029 Acute pharyngitis, unspecified: Secondary | ICD-10-CM | POA: Diagnosis present

## 2015-09-09 DIAGNOSIS — J45909 Unspecified asthma, uncomplicated: Secondary | ICD-10-CM | POA: Diagnosis not present

## 2015-09-09 DIAGNOSIS — Z880 Allergy status to penicillin: Secondary | ICD-10-CM | POA: Diagnosis not present

## 2015-09-09 DIAGNOSIS — Z8659 Personal history of other mental and behavioral disorders: Secondary | ICD-10-CM | POA: Insufficient documentation

## 2015-09-09 DIAGNOSIS — Z3202 Encounter for pregnancy test, result negative: Secondary | ICD-10-CM | POA: Diagnosis not present

## 2015-09-09 DIAGNOSIS — Z79899 Other long term (current) drug therapy: Secondary | ICD-10-CM | POA: Insufficient documentation

## 2015-09-09 LAB — POC URINE PREG, ED: Preg Test, Ur: NEGATIVE

## 2015-09-09 LAB — RAPID STREP SCREEN (MED CTR MEBANE ONLY): STREPTOCOCCUS, GROUP A SCREEN (DIRECT): NEGATIVE

## 2015-09-09 MED ORDER — AZITHROMYCIN 250 MG PO TABS: 250.0000 mg | ORAL_TABLET | Freq: Every day | ORAL | Status: DC

## 2015-09-09 MED ORDER — ALBUTEROL SULFATE HFA 108 (90 BASE) MCG/ACT IN AERS: 1.0000 | INHALATION_SPRAY | Freq: Four times a day (QID) | RESPIRATORY_TRACT | Status: DC | PRN

## 2015-09-09 MED ORDER — LIDOCAINE VISCOUS 2 % MT SOLN
15.0000 mL | Freq: Once | OROMUCOSAL | Status: AC
  Administered 2015-09-09: 15 mL via OROMUCOSAL
  Filled 2015-09-09: qty 15

## 2015-09-09 MED ORDER — LIDOCAINE VISCOUS 2 % MT SOLN: 20.0000 mL | OROMUCOSAL | Status: DC | PRN

## 2015-09-09 NOTE — ED Notes (Addendum)
Pt c/o a sore throat and left ear pain times 4 days. She states he is having a hard time swallowing due to the pajun. Pt is not drooling and does not appear in any respiratory distress. She states,"my mouth keeps salvating a lot." pt also stated she needs a RX for an inhaler as she ran out of medication. Pt was evaluated by the PA. (9:30pm)pt is requesting a pregnancy test. PA aware . Pt given a urine cup.

## 2015-09-09 NOTE — Discharge Instructions (Signed)

## 2015-09-09 NOTE — ED Provider Notes (Signed)
CSN: [id number]     Arrival date & time 09/09/15  2109 History  By signing my name below, I, Elon Spanner, attest that this documentation has been prepared under the direction and in the presence of Marlon Pel, [name]. Electronically Signed: Elon Spanner ED Scribe. 09/09/2015. 9:21 PM.    Chief Complaint  Patient presents with  . Sore Throat   The history is provided by the patient. No language interpreter was used.    HPI Comments: Lisa Marshall is a 20 y.o. female with hx of asthma who presents to the Emergency Department complaining of an constant, moderate, unchanged, bilaterally equal sore throat onset 4 days ago; worse with swallowing.  She also notes pain on the left side face/ear and subjective fever.  She denies cough, body aches, fatigue, diarrhea.   Patient also requests a refill of her albuterol inhaler.    She also reports a single episode of unprotected sex on 12/5 and took plan B the following day.  She requests pregnancy testing.  Patient uses Depo-Provera for birth control and no longer has a period.     Past Medical History  Diagnosis Date  . Asthma   . History of PID   . Depression   . Pregnancy, multiple    Past Surgical History  Procedure Laterality Date  . No past surgeries    . Tonsillectomy    . Addenoidectomy    . Cesarean section N/A 11/18/2013    Procedure: Primary Cesarean Section Delivery Baby "A" Girl @ 0447, Apgars 1/2/4, Baby "B" @ 0450, Apgars 8/9;  Surgeon: Kathreen Cosier, MD;  Location: [facility];  Service: Obstetrics;  Laterality: N/A;   Family History  Problem Relation Age of Onset  . Asthma Other   . Diabetes Other   . Cancer Other   . Sickle cell anemia Mother    Social History  Substance Use Topics  . Smoking status: Never Smoker   . Smokeless tobacco: Never Used  . Alcohol Use: No   OB History    Gravida Para Term Preterm AB TAB SAB Ectopic Multiple Living   1 1 1  0 0 0 0 0 1 2     Review of Systems 10 Systems reviewed  and all are negative for acute change except as noted in the HPI.   Allergies  Penicillins  Home Medications   Prior to Admission medications   Medication Sig Start Date End Date Taking? Authorizing Provider  albuterol (PROVENTIL HFA;VENTOLIN HFA) 108 (90 BASE) MCG/ACT inhaler Inhale 2 puffs into the lungs every 6 (six) hours as needed for wheezing or shortness of breath. Reported on 09/09/2015    Historical Provider, MD  albuterol (PROVENTIL HFA;VENTOLIN HFA) 108 (90 Base) MCG/ACT inhaler Inhale 1-2 puffs into the lungs every 6 (six) hours as needed for wheezing or shortness of breath. 09/09/15   Zakyah Yanes Neva Seat, [name]  azithromycin (ZITHROMAX) 250 MG tablet Take 1 tablet (250 mg total) by mouth daily. Take first 2 tablets together, then 1 every day until finished. 09/09/15   Diezel Mazur Neva Seat, [name]  fluconazole (DIFLUCAN) 150 MG tablet Take 1 tablet (150 mg total) by mouth once. Repeat in 72 hours if needed. Patient not taking: Reported on 08/04/2015 06/24/15   Garlon Hatchet, [name]  lidocaine (XYLOCAINE) 2 % solution Use as directed 20 mLs in the mouth or throat as needed for mouth pain. 09/09/15   Almee Pelphrey Neva Seat, [name]  ondansetron (ZOFRAN) 4 MG tablet Take 1 tablet (4 mg total) by mouth  every 8 (eight) hours as needed for nausea or vomiting. Patient not taking: Reported on 08/04/2015 07/23/15   Charm RingsErin J Honig, MD   BP 131/72 mmHg  Pulse 95  Temp(Src) 99.8 F (37.7 C) (Oral)  Ht 5\' 5"  (1.651 m)  Wt 136.079 kg  BMI 49.92 kg/m2  SpO2 100%  LMP  (LMP Unknown) Physical Exam  Constitutional: She is oriented to person, place, and time. She appears well-developed and well-nourished. No distress.  HENT:  Head: Normocephalic and atraumatic.  Right Ear: Tympanic membrane, external ear and ear canal normal.  Left Ear: Tympanic membrane, external ear and ear canal normal.  Nose: Nose normal. No rhinorrhea. Right sinus exhibits no maxillary sinus tenderness and no frontal sinus tenderness. Left sinus  exhibits no maxillary sinus tenderness and no frontal sinus tenderness.  Mouth/Throat: Uvula is midline and mucous membranes are normal. No trismus in the jaw. Normal dentition. No dental abscesses or uvula swelling. Posterior oropharyngeal erythema present. No oropharyngeal exudate, posterior oropharyngeal edema or tonsillar abscesses.  Eyes: Conjunctivae and EOM are normal.  Neck: Trachea normal, normal range of motion and full passive range of motion without pain. Neck supple. No rigidity. No tracheal deviation and normal range of motion present. No Brudzinski's sign noted.  Flexion and extension of neck without pain or difficulty. Able to breath without difficulty in extension.  Cardiovascular: Normal rate and regular rhythm.   Pulmonary/Chest: Effort normal and breath sounds normal. No stridor. No respiratory distress. She has no wheezes.  Abdominal: Soft. There is no tenderness.  No obvious evidence of splenomegaly. Non ttp.   Musculoskeletal: Normal range of motion.  Lymphadenopathy:       Head (right side): No preauricular and no posterior auricular adenopathy present.       Head (left side): No preauricular and no posterior auricular adenopathy present.    She has cervical adenopathy.  Neurological: She is alert and oriented to person, place, and time.  Skin: Skin is warm and dry. No rash noted. She is not diaphoretic.  Psychiatric: She has a normal mood and affect. Her behavior is normal.  Nursing note and vitals reviewed.   ED Course  Procedures (including critical care time)  DIAGNOSTIC STUDIES: Oxygen Saturation is 100% on RA, normal by my interpretation.    COORDINATION OF CARE:  9:41 PM Awaiting results of rapid strep.  Will order viscous lidocaine  Patient acknowledges and agrees with plan.    Labs Review Labs Reviewed  RAPID STREP SCREEN (NOT AT [facility])  CULTURE, GROUP A STREP ([facility])  POC URINE PREG, ED    Imaging Review No results found. I have personally  reviewed and evaluated these images and lab results as part of my medical decision-making.   EKG Interpretation None      MDM   Final diagnoses:  Pharyngitis    Will refill Albuterol as requested.  Pt rapid strep test Negative. Pt is tolerating secretions. Presentation not concerning for peritonsillar abscess or spread of infection to deep spaces of the throat; patent airway. Pt will be discharged with Azithromycin and viscous Lido for pain control, culture pending.  Specific return precautions discussed. Recommended PCP follow up.  Specific return precautions discussed.  Recommended PCP follow up. Pt appears safe for discharge.   I personally performed the services described in this documentation, which was scribed in my presence. The recorded information has been reviewed and is accurate.    Marlon Peliffany Jaideep Pollack, [name] 09/09/15 2235  Rolland PorterMark James, MD 09/14/15 1046

## 2015-09-13 LAB — CULTURE, GROUP A STREP (THRC)

## 2015-12-07 ENCOUNTER — Ambulatory Visit (INDEPENDENT_AMBULATORY_CARE_PROVIDER_SITE_OTHER): Payer: Medicaid Other | Admitting: Family Medicine

## 2015-12-07 ENCOUNTER — Encounter: Payer: Self-pay | Admitting: Family Medicine

## 2015-12-07 VITALS — BP 128/67 | HR 103 | Temp 99.4°F | Ht 68.0 in | Wt 309.0 lb

## 2015-12-07 DIAGNOSIS — Z202 Contact with and (suspected) exposure to infections with a predominantly sexual mode of transmission: Secondary | ICD-10-CM | POA: Diagnosis not present

## 2015-12-07 DIAGNOSIS — N912 Amenorrhea, unspecified: Secondary | ICD-10-CM

## 2015-12-07 LAB — HIV ANTIBODY (ROUTINE TESTING W REFLEX): HIV 1&2 Ab, 4th Generation: NONREACTIVE

## 2015-12-07 LAB — TSH: TSH: 2.46 mIU/L

## 2015-12-07 LAB — POCT URINE PREGNANCY: PREG TEST UR: NEGATIVE

## 2015-12-07 MED ORDER — ALBUTEROL SULFATE HFA 108 (90 BASE) MCG/ACT IN AERS: 2.0000 | INHALATION_SPRAY | Freq: Four times a day (QID) | RESPIRATORY_TRACT | Status: DC | PRN

## 2015-12-07 MED ORDER — MEDROXYPROGESTERONE ACETATE 10 MG PO TABS: 10.0000 mg | ORAL_TABLET | Freq: Every day | ORAL | Status: DC

## 2015-12-07 NOTE — Patient Instructions (Signed)
Thank you so much for coming to visit me today! We will check several labs today. I will contact you with the results. I have sent a prescription for Provera to your pharmacy. Please take one tablet daily until course is complete. Hopefully you will have period after discontinuation of medication. Follow up in 2-3 weeks.  Thanks again! Dr. Caroleen Hammanumley

## 2015-12-08 DIAGNOSIS — N912 Amenorrhea, unspecified: Secondary | ICD-10-CM | POA: Insufficient documentation

## 2015-12-08 LAB — PROLACTIN: PROLACTIN: 9.1 ng/mL

## 2015-12-08 LAB — FOLLICLE STIMULATING HORMONE: FSH: 5.4 m[IU]/mL

## 2015-12-08 MED ORDER — SENNA 8.6 MG PO TABS: 2.0000 | ORAL_TABLET | Freq: Every day | ORAL | Status: DC

## 2015-12-08 NOTE — Progress Notes (Signed)
Subjective:     Patient ID: Lisa Marshall, female   DOB: Jun 02, 1996, 20 y.o.   MRN: [id number]  HPI Mrs. Lisa Marshall is a 20yo female presenting today for secondary amenorrhea. - Reports LMP July or August 2016 - Previously received Depo for birth control. Last in June 2016 - Denies any bleeding or spotting since LMP - Notes some mild, diffuse abdominal pain and bloating. Lat bowel movement one week ago. Has tried multiples types of over the counter laxatives. - Has had nausea and occasional vomiting over the last 2-3 days.  - Last sexually active a few months ago - Denies vaginal discharge, genital ulcers - Requests testing for HIV - Nonsmoker  Review of Systems Per HPI. Other systems negative.    Objective:   Physical Exam  Constitutional: She appears well-developed and well-nourished. No distress.  Cardiovascular: Normal rate and regular rhythm.  Exam reveals no gallop and no friction rub.   No murmur heard. Pulmonary/Chest: Effort normal. No respiratory distress. She has no wheezes.  Abdominal: Soft. She exhibits no distension. There is no tenderness.  Hypoactive bowel sounds  Psychiatric: She has a normal mood and affect. Her behavior is normal.      Assessment and Plan:     1. Amenorrhea - POCT urine pregnancy negative - Will check FSH, prolactin, TSH, and estradiol today - Provera challenge - Follow up in 2-3 weeks. If no improvement consider pelvic US.  2. STD exposure - Check HIV antibody

## 2015-12-13 LAB — ESTRADIOL, FREE
Estradiol, Free: 0.99 pg/mL
Estradiol: 45 pg/mL

## 2016-01-07 ENCOUNTER — Ambulatory Visit (INDEPENDENT_AMBULATORY_CARE_PROVIDER_SITE_OTHER): Payer: Medicaid Other

## 2016-01-07 ENCOUNTER — Encounter (HOSPITAL_COMMUNITY): Payer: Self-pay | Admitting: *Deleted

## 2016-01-07 ENCOUNTER — Ambulatory Visit (HOSPITAL_COMMUNITY)
Admission: EM | Admit: 2016-01-07 | Discharge: 2016-01-07 | Disposition: A | Discharge status: Home / Self Care | Payer: Medicaid Other | Attending: Family Medicine | Admitting: Family Medicine

## 2016-01-07 DIAGNOSIS — M7989 Other specified soft tissue disorders: Secondary | ICD-10-CM

## 2016-01-07 DIAGNOSIS — S6992XA Unspecified injury of left wrist, hand and finger(s), initial encounter: Secondary | ICD-10-CM

## 2016-01-07 MED ORDER — TRAMADOL HCL 50 MG PO TABS: 50.0000 mg | ORAL_TABLET | Freq: Four times a day (QID) | ORAL | Status: DC | PRN
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
## 2016-01-07 NOTE — Discharge Instructions (Signed)
It was nice seeing you today. I am sorry about your injury. I spoke with the radiologist ( Dr. Fredricka Bonineatavan) he confirmed that there is no fracture or dislocation of your hand joint or bone. Please use pain medication as needed. You should feel better in few days. Call if you have any question.

## 2016-02-17 ENCOUNTER — Other Ambulatory Visit: Payer: Self-pay

## 2016-02-17 ENCOUNTER — Encounter (HOSPITAL_COMMUNITY): Payer: Self-pay

## 2016-02-17 ENCOUNTER — Emergency Department (HOSPITAL_COMMUNITY)
Admission: EM | Admit: 2016-02-17 | Discharge: 2016-02-17 | Disposition: A | Discharge status: Home / Self Care | Payer: Medicaid Other | Attending: Emergency Medicine | Admitting: Emergency Medicine

## 2016-02-17 ENCOUNTER — Emergency Department (HOSPITAL_COMMUNITY): Payer: Medicaid Other

## 2016-02-17 DIAGNOSIS — M940 Chondrocostal junction syndrome [Tietze]: Secondary | ICD-10-CM | POA: Insufficient documentation

## 2016-02-17 DIAGNOSIS — J45909 Unspecified asthma, uncomplicated: Secondary | ICD-10-CM | POA: Insufficient documentation

## 2016-02-17 DIAGNOSIS — R079 Chest pain, unspecified: Secondary | ICD-10-CM | POA: Diagnosis present

## 2016-02-17 LAB — BASIC METABOLIC PANEL
Anion gap: 5 (ref 5–15)
BUN: 11 mg/dL (ref 6–20)
CHLORIDE: 106 mmol/L (ref 101–111)
CO2: 25 mmol/L (ref 22–32)
Calcium: 9 mg/dL (ref 8.9–10.3)
Creatinine, Ser: 0.62 mg/dL (ref 0.44–1.00)
GFR calc Af Amer: >60 mL/min (ref >60)
GFR calc non Af Amer: >60 mL/min (ref >60)
GLUCOSE: 139 mg/dL — AB (ref 65–99)
Potassium: 3.9 mmol/L (ref 3.5–5.1)
SODIUM: 136 mmol/L (ref 135–145)

## 2016-02-17 LAB — CBC
HEMATOCRIT: 35.6 % — AB (ref 36.0–46.0)
Hemoglobin: 11.4 g/dL — ABNORMAL LOW (ref 12.0–15.0)
MCH: 26.4 pg (ref 26.0–34.0)
MCHC: 32 g/dL (ref 30.0–36.0)
MCV: 82.4 fL (ref 78.0–100.0)
Platelets: 361 10*3/uL (ref 150–400)
RBC: 4.32 MIL/uL (ref 3.87–5.11)
RDW: 14.1 % (ref 11.5–15.5)
WBC: 4.2 10*3/uL (ref 4.0–10.5)

## 2016-02-17 LAB — I-STAT TROPONIN, ED: Troponin i, poc: 0 ng/mL (ref 0.00–0.08)

## 2016-02-17 MED ORDER — TRAMADOL HCL 50 MG PO TABS: 50.0000 mg | ORAL_TABLET | Freq: Four times a day (QID) | ORAL | Status: DC | PRN

## 2016-02-17 NOTE — Discharge Instructions (Signed)
Costochondritis  Costochondritis is a condition in which the tissue (cartilage) that connects your ribs with your breastbone (sternum) becomes irritated. It causes pain in the chest and rib area. It usually goes away on its own over time.  HOME CARE  · Avoid activities that wear you out.  · Do not strain your ribs. Avoid activities that use your:    Chest.    Belly.    Side muscles.  · Put ice on the area for the first 2 days after the pain starts.    Put ice in a plastic bag.    Place a towel between your skin and the bag.    Leave the ice on for 20 minutes, 2-3 times a day.  · Only take medicine as told by your doctor.  GET HELP IF:  · You have redness or puffiness (swelling) in the rib area.  · Your pain does not go away with rest or medicine.  GET HELP RIGHT AWAY IF:   · Your pain gets worse.  · You are very uncomfortable.  · You have trouble breathing.  · You cough up blood.  · You start sweating or throwing up (vomiting).  · You have a fever or lasting symptoms for more than 2-3 days.  · You have a fever and your symptoms suddenly get worse.  MAKE SURE YOU:   · Understand these instructions.  · Will watch your condition.  · Will get help right away if you are not doing well or get worse.     This information is not intended to replace advice given to you by your health care provider. Make sure you discuss any questions you have with your health care provider.     Document Released: 01/29/2008 Document Revised: 04/14/2013 Document Reviewed: 03/16/2013  Elsevier Interactive Patient Education ©2016 [employer]

## 2016-02-17 NOTE — ED Notes (Addendum)
Pt reports she is under tremendous stress at her work. Pain is reproducible and tender to palpation. 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 RN provided education about pain management at home.

## 2016-02-17 NOTE — ED Provider Notes (Signed)
CSN: [id number]     Arrival date & time 02/17/16  1035 History   First MD Initiated Contact with Patient 02/17/16 1108     Chief Complaint  Patient presents with  . Chest Pain      HPI Patient who is tearful and states that she's been under a lot of stress at her job.  She said yesterday was a particularly bad day and she developed chest pain last night.  She said she got up and felt dizzy headed and then woke up on the floor.  She doesn't have shortness of breath.  Has history of DVT when she was pregnant.  Had a negative CT angiogram done in December that showed pneumonia but no clot. Past Medical History  Diagnosis Date  . Asthma   . History of PID   . Depression   . Pregnancy, multiple    Past Surgical History  Procedure Laterality Date  . No past surgeries    . Tonsillectomy    . Addenoidectomy    . Cesarean section N/A 11/18/2013    Procedure: Primary Cesarean Section Delivery Baby "A" Girl @ 0447, Apgars 1/2/4, Baby "B" @ 0450, Apgars 8/9;  Surgeon: Kathreen CosierBernard A Marshall, MD;  Location: [facility];  Service: Obstetrics;  Laterality: N/A;   Family History  Problem Relation Age of Onset  . Asthma Other   . Diabetes Other   . Cancer Other   . Sickle cell anemia Mother    Social History  Substance Use Topics  . Smoking status: Never Smoker   . Smokeless tobacco: Never Used  . Alcohol Use: No   OB History    Gravida Para Term Preterm AB TAB SAB Ectopic Multiple Living   1 1 1  0 0 0 0 0 1 2     Review of Systems  All other systems reviewed and are negative  Allergies  Penicillins  Home Medications   Prior to Admission medications   Medication Sig Start Date End Date Taking? Authorizing Provider  albuterol (PROVENTIL HFA;VENTOLIN HFA) 108 (90 Base) MCG/ACT inhaler Inhale 2 puffs into the lungs every 6 (six) hours as needed for wheezing or shortness of breath. Reported on 09/09/2015 12/07/15  Yes Adrian N Rumley, DO  medroxyPROGESTERone (PROVERA) 10 MG tablet Take 1  tablet (10 mg total) by mouth daily. Patient not taking: Reported on 02/17/2016 12/07/15   [place] N Rumley, DO  ondansetron (ZOFRAN) 4 MG tablet Take 1 tablet (4 mg total) by mouth every 8 (eight) hours as needed for nausea or vomiting. Patient not taking: Reported on 08/04/2015 07/23/15   Charm RingsErin J Honig, MD  senna (SENOKOT) 8.6 MG TABS tablet Take 2 tablets (17.2 mg total) by mouth daily. Patient not taking: Reported on 02/17/2016 12/08/15   Lora Havensaleigh N Rumley, DO  traMADol (ULTRAM) 50 MG tablet Take 1 tablet (50 mg total) by mouth every 6 (six) hours as needed. 02/17/16   Nelva Nayobert Ricard Faulkner, MD   BP 113/77 mmHg  Pulse 99  Temp(Src) 98.9 F (37.2 C) (Oral)  Resp 18  Ht 5\' 3"  (1.6 m)  Wt 302 lb 11.2 oz (137.304 kg)  BMI 53.63 kg/m2  SpO2 98% Physical Exam  Constitutional: She is oriented to person, place, and time. She appears well-developed and well-nourished. No distress.  HENT:  Head: Normocephalic and atraumatic.  Eyes: Pupils are equal, round, and reactive to light.  Neck: Normal range of motion.  Cardiovascular: Normal rate and intact distal pulses.   Pulmonary/Chest: No respiratory distress.  Pain is clearly reproducible to palpation of anterior chest wall.  Abdominal: Normal appearance. She exhibits no distension.  Musculoskeletal: Normal range of motion.  Neurological: She is alert and oriented to person, place, and time. No cranial nerve deficit.  Skin: Skin is warm and dry. No rash noted.  Psychiatric: She has a normal mood and affect. Her behavior is normal.  Nursing note and vitals reviewed.   ED Course  Procedures (including critical care time) Labs Review Labs Reviewed  BASIC METABOLIC PANEL - Abnormal; Notable for the following:    Glucose, Bld 139 (*)    All other components within normal limits  CBC - Abnormal; Notable for the following:    Hemoglobin 11.4 (*)    HCT 35.6 (*)    All other components within normal limits  I-STAT TROPOININ, ED    Imaging  Review Dg Chest 2 View  02/17/2016  CLINICAL DATA:  LEFT side chest pain and shortness of breath this morning, history asthma EXAM: CHEST  2 VIEW COMPARISON:  08/04/2015 FINDINGS: Normal heart size, mediastinal contours, and pulmonary vascularity. Minimal peribronchial thickening. No pulmonary infiltrate, pleural effusion, or pneumothorax. Bones unremarkable. IMPRESSION: Minimal bronchitic changes without infiltrate. Electronically Signed   By: Ulyses SouthwardMark  Boles M.D.   On: 02/17/2016 11:25   I have personally reviewed and evaluated these images and lab results as part of my medical decision-making.   EKG Interpretation   Date/Time:  Saturday February 17 2016 10:40:17 EDT Ventricular Rate:  94 PR Interval:  152 QRS Duration: 74 QT Interval:  358 QTC Calculation: 447 R Axis:   22 Text Interpretation:  Normal sinus rhythm Normal ECG Confirmed by Leen Tworek   MD, Dekari Bures ([id number]) on 02/17/2016 11:22:52 AM      MDM   Final diagnoses:  Costochondritis, acute        Nelva Nayobert Anadelia Kintz, MD 02/17/16 1144

## 2016-02-17 NOTE — ED Notes (Signed)
PT ambulated with baseline gait; VSS; A&Ox3; no signs of distress; respirations even and unlabored; skin warm and dry; no questions upon discharge.  

## 2016-02-17 NOTE — ED Notes (Signed)
Patient complains of left anterior stabbing chest pain since am. States that she got up to go to bathroom at 0200 and had syncopal episode. Awoke on floor. Pain worse with inspiration

## 2016-03-17 ENCOUNTER — Encounter (HOSPITAL_COMMUNITY): Payer: Self-pay | Admitting: *Deleted

## 2016-03-17 ENCOUNTER — Ambulatory Visit (HOSPITAL_COMMUNITY)
Admission: EM | Admit: 2016-03-17 | Discharge: 2016-03-17 | Disposition: A | Discharge status: Home / Self Care | Payer: Medicaid Other | Attending: Family Medicine | Admitting: Family Medicine

## 2016-03-17 DIAGNOSIS — L240 Irritant contact dermatitis due to detergents: Secondary | ICD-10-CM | POA: Diagnosis not present

## 2016-03-17 HISTORY — DX: Syncope and collapse: R55

## 2016-03-17 MED ORDER — FLUTICASONE PROPIONATE 0.05 % EX CREA: TOPICAL_CREAM | Freq: Two times a day (BID) | CUTANEOUS | 0 refills | Status: DC

## 2016-03-17 NOTE — ED Triage Notes (Signed)
Pt reports washing her underwear in new laundry detergent 2 days ago.  Yesterday started breaking out in small pruritic bumps to pubic area and bilat inner thighs.  Denies hx herpes.

## 2016-03-17 NOTE — ED Provider Notes (Signed)
[facility]    CSN: [id number] Arrival date & time: 03/17/16  1615  First Provider Contact:  First MD Initiated Contact with Patient 03/17/16 1650        History   Chief Complaint Chief Complaint  Patient presents with  . Rash    HPI Lisa Marshall is a 20 y.o. female.   The history is provided by the patient.  Rash  Location:  Pelvis Pelvic rash location:  Perineum Quality: itchiness   Severity:  Mild Onset quality:  Sudden Timing:  Constant Progression:  Unchanged Chronicity:  New Context: new detergent/soap   Context comment:  Washed underwear on fri in new detergent and awoke sat with pelvic rash, Relieved by:  None tried Worsened by:  Nothing Ineffective treatments:  None tried Associated symptoms: no abdominal pain and no fever     Past Medical History:  Diagnosis Date  . Asthma   . Depression   . History of PID   . Pregnancy, multiple   . Syncope     Patient Active Problem List   Diagnosis Date Noted  . Amenorrhea 12/08/2015  . Encounter for surveillance of injectable contraceptive 12/23/2014  . Mood disorder, unspecified episodic  06/21/2013  . Obesity, unspecified 11/06/2007  . GERD 11/06/2007  . ASTHMA, INTERMITTENT 10/23/2006    Past Surgical History:  Procedure Laterality Date  . ADENOIDECTOMY    . CESAREAN SECTION N/A 11/18/2013   Procedure: Primary Cesarean Section Delivery Baby "A" Girl @ 0447, Apgars 1/2/4, Baby "B" @ 0450, Apgars 8/9;  Surgeon: Kathreen Cosier, MD;  Location: [facility];  Service: Obstetrics;  Laterality: N/A;  . NO PAST SURGERIES    . TONSILLECTOMY      OB History    Gravida Para Term Preterm AB Living   1 1 1  0 0 2   SAB TAB Ectopic Multiple Live Births   0 0 0 1         Home Medications    Prior to Admission medications   Medication Sig Start Date End Date Taking? Authorizing Provider  albuterol (PROVENTIL HFA;VENTOLIN HFA) 108 (90 Base) MCG/ACT inhaler Inhale 2 puffs into the lungs every  6 (six) hours as needed for wheezing or shortness of breath. Reported on 09/09/2015 12/07/15  Yes [place] N Rumley, DO  fluticasone (CUTIVATE) 0.05 % cream Apply topically 2 (two) times daily. 03/17/16   Linna Hoff, MD  medroxyPROGESTERone (PROVERA) 10 MG tablet Take 1 tablet (10 mg total) by mouth daily. Patient not taking: Reported on 02/17/2016 12/07/15   [place] N Rumley, DO  ondansetron (ZOFRAN) 4 MG tablet Take 1 tablet (4 mg total) by mouth every 8 (eight) hours as needed for nausea or vomiting. Patient not taking: Reported on 08/04/2015 07/23/15   Charm Rings, MD  senna (SENOKOT) 8.6 MG TABS tablet Take 2 tablets (17.2 mg total) by mouth daily. Patient not taking: Reported on 02/17/2016 12/08/15   Lora Havens Rumley, DO  traMADol (ULTRAM) 50 MG tablet Take 1 tablet (50 mg total) by mouth every 6 (six) hours as needed. 02/17/16   Nelva Nay, MD    Family History Family History  Problem Relation Age of Onset  . Asthma Other   . Diabetes Other   . Cancer Other   . Sickle cell anemia Mother     Social History Social History  Substance Use Topics  . Smoking status: Never Smoker  . Smokeless tobacco: Never Used  . Alcohol use No  Allergies   Penicillins   Review of Systems Review of Systems  Constitutional: Negative.  Negative for chills and fever.  Gastrointestinal: Negative for abdominal pain.  Skin: Positive for rash.  All other systems reviewed and are negative.    Physical Exam Triage Vital Signs ED Triage Vitals  Enc Vitals Group     BP 03/17/16 1633 144/75     Pulse Rate 03/17/16 1633 112     Resp 03/17/16 1633 20     Temp 03/17/16 1633 98.9 F (37.2 C)     Temp Source 03/17/16 1633 Oral     SpO2 03/17/16 1633 99 %     Weight --      Height --      Head Circumference --      Peak Flow --      Pain Score 03/17/16 1635 8     Pain Loc --      Pain Edu? --      Excl. in GC? --    No data found.   Updated Vital Signs BP 144/75   Pulse 112   Temp  98.9 F (37.2 C) (Oral)   Resp 20   SpO2 99%   Visual Acuity Right Eye Distance:   Left Eye Distance:   Bilateral Distance:    Right Eye Near:   Left Eye Near:    Bilateral Near:     Physical Exam  Constitutional: She appears well-developed and well-nourished. No distress.  Skin: Skin is dry. Capillary refill takes less than 2 seconds. Rash noted.  Pruritic dry pubic rash.  Nursing note and vitals reviewed.    UC Treatments / Results  Labs (all labs ordered are listed, but only abnormal results are displayed) Labs Reviewed - No data to display  EKG  EKG Interpretation None       Radiology No results found.  Procedures Procedures (including critical care time)  Medications Ordered in UC Medications - No data to display   Initial Impression / Assessment and Plan / UC Course  I have reviewed the triage vital signs and the nursing notes.  Pertinent labs & imaging results that were available during my care of the patient were reviewed by me and considered in my medical decision making (see chart for details).  Clinical Course     Final Clinical Impressions(s) / UC Diagnoses   Final diagnoses:  Contact dermatitis due to detergents    New Prescriptions New Prescriptions   FLUTICASONE (CUTIVATE) 0.05 % CREAM    Apply topically 2 (two) times daily.     Linna Hoff, MD 03/17/16 2118

## 2016-04-16 ENCOUNTER — Encounter: Payer: Medicaid Other | Admitting: Family Medicine

## 2016-04-26 ENCOUNTER — Encounter (HOSPITAL_COMMUNITY): Payer: Self-pay | Admitting: Emergency Medicine

## 2016-04-26 ENCOUNTER — Ambulatory Visit (HOSPITAL_COMMUNITY)
Admission: EM | Admit: 2016-04-26 | Discharge: 2016-04-26 | Disposition: A | Discharge status: Home / Self Care | Payer: Medicaid Other | Attending: Family Medicine | Admitting: Family Medicine

## 2016-04-26 DIAGNOSIS — L0291 Cutaneous abscess, unspecified: Secondary | ICD-10-CM

## 2016-04-26 MED ORDER — LIDOCAINE-EPINEPHRINE (PF) 2 %-1:200000 IJ SOLN
INTRAMUSCULAR | Status: AC
  Filled 2016-04-26: qty 20

## 2016-04-26 NOTE — ED Provider Notes (Signed)
CSN: [id number]     Arrival date & time 04/26/16  1944 History   First MD Initiated Contact with Patient 04/26/16 2024     Chief Complaint  Patient presents with  . Abscess   (Consider location/radiation/quality/duration/timing/severity/associated sxs/prior Treatment) 20 year old female presents with a boil to the left lower abdomen. States it started 3 days ago. Planing of localized pain and tenderness. Never had one like this before.      Past Medical History:  Diagnosis Date  . Asthma   . Depression   . History of PID   . Pregnancy, multiple   . Syncope    Past Surgical History:  Procedure Laterality Date  . ADENOIDECTOMY    . CESAREAN SECTION N/A 11/18/2013   Procedure: Primary Cesarean Section Delivery Baby "A" Girl @ 0447, Apgars 1/2/4, Baby "B" @ 0450, Apgars 8/9;  Surgeon: Kathreen CosierBernard A Marshall, MD;  Location: [facility];  Service: Obstetrics;  Laterality: N/A;  . NO PAST SURGERIES    . TONSILLECTOMY     Family History  Problem Relation Age of Onset  . Asthma Other   . Diabetes Other   . Cancer Other   . Sickle cell anemia Mother    Social History  Substance Use Topics  . Smoking status: Never Smoker  . Smokeless tobacco: Never Used  . Alcohol use No   OB History    Gravida Para Term Preterm AB Living   1 1 1  0 0 2   SAB TAB Ectopic Multiple Live Births   0 0 0 1 2     Review of Systems  Constitutional: Negative.   Respiratory: Negative.   Gastrointestinal: Negative.   Musculoskeletal: Negative.   Skin:       As per history of present illness.  Neurological: Negative.   All other systems reviewed and are negative.   Allergies  Penicillins  Home Medications   Prior to Admission medications   Medication Sig Start Date End Date Taking? Authorizing Provider  albuterol (PROVENTIL HFA;VENTOLIN HFA) 108 (90 Base) MCG/ACT inhaler Inhale 2 puffs into the lungs every 6 (six) hours as needed for wheezing or shortness of breath. Reported on 09/09/2015 12/07/15   Yes [place] N Rumley, DO  fluticasone (CUTIVATE) 0.05 % cream Apply topically 2 (two) times daily. 03/17/16   Linna HoffJames D Kindl, MD  medroxyPROGESTERone (PROVERA) 10 MG tablet Take 1 tablet (10 mg total) by mouth daily. Patient not taking: Reported on 02/17/2016 12/07/15   [place] N Rumley, DO  ondansetron (ZOFRAN) 4 MG tablet Take 1 tablet (4 mg total) by mouth every 8 (eight) hours as needed for nausea or vomiting. Patient not taking: Reported on 08/04/2015 07/23/15   Charm RingsErin J Honig, MD  senna (SENOKOT) 8.6 MG TABS tablet Take 2 tablets (17.2 mg total) by mouth daily. Patient not taking: Reported on 02/17/2016 12/08/15   Lora Havensaleigh N Rumley, DO  traMADol (ULTRAM) 50 MG tablet Take 1 tablet (50 mg total) by mouth every 6 (six) hours as needed. 02/17/16   Nelva Nayobert Beaton, MD   Meds Ordered and Administered this Visit  Medications - No data to display  BP 118/81 (BP Location: Right Wrist)   Pulse 85   Temp 97.8 F (36.6 C) (Oral)   Resp 18   SpO2 99%  No data found.   Physical Exam  Constitutional: She is oriented to person, place, and time. She appears well-developed and well-nourished. No distress.  Eyes: EOM are normal.  Neck: Normal range of motion. Neck supple.  Cardiovascular: Normal rate.   Pulmonary/Chest: Effort normal. No respiratory distress.  Musculoskeletal: She exhibits no edema.  Neurological: She is alert and oriented to person, place, and time. She exhibits normal muscle tone.  Skin: Skin is warm and dry.  Left lower abdomen  t left of the midhe suprapubic line there is a raised fluctuant area of 2 cm with approximately 2 cm of surrounding induration. No erythema or signs of cellulitis.   Psychiatric: She has a normal mood and affect.  Nursing note and vitals reviewed.   Urgent Care Course   Clinical Course    .Marland KitchenIncision and Drainage Date/Time: 04/26/2016 8:47 PM Performed by: Phineas Real, Link Burgeson Authorized by: Bradd Canary D   Consent:    Consent obtained:  Verbal   Consent given  by:  Patient   Risks discussed:  Pain   Alternatives discussed:  No treatment and delayed treatment Location:    Type:  Abscess   Size:  3   Location:  Trunk   Trunk location:  Abdomen Pre-procedure details:    Skin preparation:  Betadine Anesthesia (see MAR for exact dosages):    Anesthesia method:  Local infiltration   Local anesthetic:  Lidocaine 2% WITH epi Procedure type:    Complexity:  Simple Procedure details:    Needle aspiration: no     Incision types:  Single with marsupialization   Incision depth:  Subcutaneous   Scalpel blade:  11   Wound management:  Irrigated with saline   Drainage:  Purulent   Drainage amount:  Moderate   Wound treatment:  Drain placed   Packing materials:  1/2 in gauze Post-procedure details:    Patient tolerance of procedure:  Tolerated well, no immediate complications Comments:     Superficial incision for small lesion.    (including critical care time)  Labs Review Labs Reviewed - No data to display  Imaging Review No results found.   Visual Acuity Review  Right Eye Distance:   Left Eye Distance:   Bilateral Distance:    Right Eye Near:   Left Eye Near:    Bilateral Near:         MDM   1. Abscess    I&D. The abscess was relatively small and easily drained with incision. Return in 2 days or sooner for problems.Warm compresses. Keep dry, Return in 2 days. If worse or problems return sooner.     Hayden Rasmussen, NP 04/26/16 2049    Hayden Rasmussen, NP 04/26/16 2050

## 2016-04-26 NOTE — Discharge Instructions (Signed)
Warm compresses. Keep dry, Return in 2 days. If worse or problems return sooner.

## 2016-04-26 NOTE — ED Triage Notes (Signed)
Pt c/o abscess to lower abd onset x5  Days... Getting bigger and more painful  Denies fevers, drainage  A&O x4... NAD

## 2016-04-30 ENCOUNTER — Encounter (HOSPITAL_COMMUNITY): Payer: Self-pay | Admitting: Emergency Medicine

## 2016-04-30 ENCOUNTER — Ambulatory Visit (HOSPITAL_COMMUNITY)
Admission: EM | Admit: 2016-04-30 | Discharge: 2016-04-30 | Disposition: A | Discharge status: Home / Self Care | Payer: Medicaid Other

## 2016-04-30 DIAGNOSIS — Z4801 Encounter for change or removal of surgical wound dressing: Secondary | ICD-10-CM

## 2016-04-30 DIAGNOSIS — Z5189 Encounter for other specified aftercare: Secondary | ICD-10-CM

## 2016-04-30 NOTE — ED Provider Notes (Signed)
[facility]    CSN: [id number] Arrival date & time: 04/30/16  1630  First Provider Contact:  First MD Initiated Contact with Patient 04/30/16 1711        History   Chief Complaint Chief Complaint  Patient presents with  . Abscess    HPI Lisa Marshall is a 20 y.o. female.   The history is provided by the patient.  Wound Check  This is a new problem. The current episode started more than 2 days ago (seen 9/1 for I+D of abd wall abscess, doing well.). The problem has been rapidly improving. Pertinent negatives include no chest pain and no abdominal pain.    Past Medical History:  Diagnosis Date  . Asthma   . Depression   . History of PID   . Pregnancy, multiple   . Syncope     Patient Active Problem List   Diagnosis Date Noted  . Amenorrhea 12/08/2015  . Encounter for surveillance of injectable contraceptive 12/23/2014  . Mood disorder, unspecified episodic  06/21/2013  . Obesity, unspecified 11/06/2007  . GERD 11/06/2007  . ASTHMA, INTERMITTENT 10/23/2006    Past Surgical History:  Procedure Laterality Date  . ADENOIDECTOMY    . CESAREAN SECTION N/A 11/18/2013   Procedure: Primary Cesarean Section Delivery Baby "A" Girl @ 0447, Apgars 1/2/4, Baby "B" @ 0450, Apgars 8/9;  Surgeon: Kathreen CosierBernard A Marshall, MD;  Location: [facility];  Service: Obstetrics;  Laterality: N/A;  . NO PAST SURGERIES    . TONSILLECTOMY      OB History    Gravida Para Term Preterm AB Living   1 1 1  0 0 2   SAB TAB Ectopic Multiple Live Births   0 0 0 1 2       Home Medications    Prior to Admission medications   Medication Sig Start Date End Date Taking? Authorizing Provider  albuterol (PROVENTIL HFA;VENTOLIN HFA) 108 (90 Base) MCG/ACT inhaler Inhale 2 puffs into the lungs every 6 (six) hours as needed for wheezing or shortness of breath. Reported on 09/09/2015 12/07/15   [place] N Rumley, DO  fluticasone (CUTIVATE) 0.05 % cream Apply topically 2 (two) times daily. 03/17/16    Linna HoffJames D Charnika Herbst, MD  medroxyPROGESTERone (PROVERA) 10 MG tablet Take 1 tablet (10 mg total) by mouth daily. Patient not taking: Reported on 02/17/2016 12/07/15   [place] N Rumley, DO  ondansetron (ZOFRAN) 4 MG tablet Take 1 tablet (4 mg total) by mouth every 8 (eight) hours as needed for nausea or vomiting. Patient not taking: Reported on 08/04/2015 07/23/15   Charm RingsErin J Honig, MD  senna (SENOKOT) 8.6 MG TABS tablet Take 2 tablets (17.2 mg total) by mouth daily. Patient not taking: Reported on 02/17/2016 12/08/15   Lora Havensaleigh N Rumley, DO  traMADol (ULTRAM) 50 MG tablet Take 1 tablet (50 mg total) by mouth every 6 (six) hours as needed. 02/17/16   Nelva Nayobert Beaton, MD    Family History Family History  Problem Relation Age of Onset  . Asthma Other   . Diabetes Other   . Cancer Other   . Sickle cell anemia Mother     Social History Social History  Substance Use Topics  . Smoking status: Never Smoker  . Smokeless tobacco: Never Used  . Alcohol use No     Allergies   Penicillins   Review of Systems Review of Systems  Cardiovascular: Negative for chest pain.  Gastrointestinal: Negative.  Negative for abdominal pain.  Genitourinary: Negative.  Musculoskeletal: Negative.   Skin: Positive for wound.  All other systems reviewed and are negative.    Physical Exam Triage Vital Signs ED Triage Vitals  Enc Vitals Group     BP 04/30/16 1700 128/82     Pulse Rate 04/30/16 1700 77     Resp 04/30/16 1700 16     Temp 04/30/16 1700 98.4 F (36.9 C)     Temp Source 04/30/16 1700 Oral     SpO2 04/30/16 1700 100 %     Weight --      Height --      Head Circumference --      Peak Flow --      Pain Score 04/30/16 1713 3     Pain Loc --      Pain Edu? --      Excl. in GC? --    No data found.   Updated Vital Signs BP 128/82 (BP Location: Right Arm)   Pulse 77   Temp 98.4 F (36.9 C) (Oral)   Resp 16   LMP 04/14/2016   SpO2 100%   Visual Acuity Right Eye Distance:   Left Eye  Distance:   Bilateral Distance:    Right Eye Near:   Left Eye Near:    Bilateral Near:     Physical Exam  Constitutional: She appears well-developed and well-nourished. No distress.  Abdominal: Soft. Bowel sounds are normal.  Skin: Skin is warm and dry.  Packing removed, no problems  Nursing note and vitals reviewed.    UC Treatments / Results  Labs (all labs ordered are listed, but only abnormal results are displayed) Labs Reviewed - No data to display  EKG  EKG Interpretation None       Radiology No results found.  Procedures Procedures (including critical care time)  Medications Ordered in UC Medications - No data to display   Initial Impression / Assessment and Plan / UC Course  I have reviewed the triage vital signs and the nursing notes.  Pertinent labs & imaging results that were available during my care of the patient were reviewed by me and considered in my medical decision making (see chart for details).  Clinical Course      Final Clinical Impressions(s) / UC Diagnoses   Final diagnoses:  None    New Prescriptions New Prescriptions   No medications on file     Linna Hoff, MD 04/30/16 1728

## 2016-04-30 NOTE — Discharge Instructions (Signed)
Wash and cover as needed, return if any problems

## 2016-04-30 NOTE — ED Triage Notes (Signed)
Pt. Stated, Im here for a recheck I had an abscess to my left lower abdomen. Its for a check

## 2016-10-23 ENCOUNTER — Ambulatory Visit (HOSPITAL_COMMUNITY)
Admission: EM | Admit: 2016-10-23 | Discharge: 2016-10-23 | Disposition: A | Discharge status: Home / Self Care | Payer: Medicaid Other | Attending: Family Medicine | Admitting: Family Medicine

## 2016-10-23 ENCOUNTER — Emergency Department (HOSPITAL_COMMUNITY): Payer: Medicaid Other

## 2016-10-23 ENCOUNTER — Encounter (HOSPITAL_COMMUNITY): Payer: Self-pay | Admitting: *Deleted

## 2016-10-23 ENCOUNTER — Emergency Department (HOSPITAL_COMMUNITY)
Admission: EM | Admit: 2016-10-23 | Discharge: 2016-10-23 | Disposition: A | Discharge status: Home / Self Care | Payer: Medicaid Other | Attending: Emergency Medicine | Admitting: Emergency Medicine

## 2016-10-23 DIAGNOSIS — R0789 Other chest pain: Secondary | ICD-10-CM

## 2016-10-23 DIAGNOSIS — Z5321 Procedure and treatment not carried out due to patient leaving prior to being seen by health care provider: Secondary | ICD-10-CM | POA: Diagnosis not present

## 2016-10-23 DIAGNOSIS — F419 Anxiety disorder, unspecified: Secondary | ICD-10-CM | POA: Diagnosis not present

## 2016-10-23 DIAGNOSIS — M940 Chondrocostal junction syndrome [Tietze]: Secondary | ICD-10-CM

## 2016-10-23 LAB — I-STAT BETA HCG BLOOD, ED (MC, WL, AP ONLY)

## 2016-10-23 LAB — BASIC METABOLIC PANEL
Anion gap: 8 (ref 5–15)
BUN: 10 mg/dL (ref 6–20)
CALCIUM: 9.2 mg/dL (ref 8.9–10.3)
CO2: 25 mmol/L (ref 22–32)
CREATININE: 0.53 mg/dL (ref 0.44–1.00)
Chloride: 103 mmol/L (ref 101–111)
GFR calc non Af Amer: >60 mL/min (ref >60)
Glucose, Bld: 117 mg/dL — ABNORMAL HIGH (ref 65–99)
Potassium: 3.9 mmol/L (ref 3.5–5.1)
SODIUM: 136 mmol/L (ref 135–145)

## 2016-10-23 LAB — CBC
HCT: 35.5 % — ABNORMAL LOW (ref 36.0–46.0)
Hemoglobin: 11.5 g/dL — ABNORMAL LOW (ref 12.0–15.0)
MCH: 27.6 pg (ref 26.0–34.0)
MCHC: 32.4 g/dL (ref 30.0–36.0)
MCV: 85.1 fL (ref 78.0–100.0)
PLATELETS: 376 10*3/uL (ref 150–400)
RBC: 4.17 MIL/uL (ref 3.87–5.11)
RDW: 13.2 % (ref 11.5–15.5)
WBC: 5.1 10*3/uL (ref 4.0–10.5)

## 2016-10-23 LAB — I-STAT TROPONIN, ED: TROPONIN I, POC: 0 ng/mL (ref 0.00–0.08)

## 2016-10-23 NOTE — ED Provider Notes (Signed)
CSN: [id number]     Arrival date & time 10/23/16  1400 History   First MD Initiated Contact with Patient 10/23/16 1505     Chief Complaint  Patient presents with  . Chest Pain   (Consider location/radiation/quality/duration/timing/severity/associated sxs/prior Treatment) 21 year old female awoke this morning with right upper chest pain. She states that it is worse with movement of the right arm. She also states that she is under a lot of stress at work. She also has a mild right-sided headache that radiates into the right neck muscles. There is no heaviness, tightness, fullness or pressure. She had no nausea, vomiting, diaphoresis or shortness of breath. Interestingly, she went to the emergency department and underwent a standard cardiac workup to include the first troponin. At that time she was told that everything was negative but she needed to wait for the sequential troponin blood draws. She was also advised that there were 15 people ahead of her. She stated she did not want to wait and decided to come to the urgent care for completion of her evaluation.      Past Medical History:  Diagnosis Date  . Asthma   . Depression   . History of PID   . Pregnancy, multiple   . Syncope    Past Surgical History:  Procedure Laterality Date  . ADENOIDECTOMY    . CESAREAN SECTION N/A 11/18/2013   Procedure: Primary Cesarean Section Delivery Baby "A" Girl @ 0447, Apgars 1/2/4, Baby "B" @ 0450, Apgars 8/9;  Surgeon: Kathreen Cosier, MD;  Location: [facility];  Service: Obstetrics;  Laterality: N/A;  . NO PAST SURGERIES    . TONSILLECTOMY     Family History  Problem Relation Age of Onset  . Asthma Other   . Diabetes Other   . Cancer Other   . Sickle cell anemia Mother    Social History  Substance Use Topics  . Smoking status: Never Smoker  . Smokeless tobacco: Never Used  . Alcohol use No   OB History    Gravida Para Term Preterm AB Living   1 1 1  0 0 2   SAB TAB Ectopic Multiple Live  Births   0 0 0 1 2     Review of Systems  Constitutional: Negative for activity change and fever.  HENT: Negative.   Respiratory: Negative.  Negative for shortness of breath and wheezing.   Cardiovascular: Positive for chest pain. Negative for palpitations and leg swelling.  Gastrointestinal: Negative.   Genitourinary: Negative.   Musculoskeletal: Negative.   Skin: Negative.   Neurological: Negative.   All other systems reviewed and are negative.   Allergies  Penicillins  Home Medications   Prior to Admission medications   Medication Sig Start Date End Date Taking? Authorizing Provider  albuterol (PROVENTIL HFA;VENTOLIN HFA) 108 (90 Base) MCG/ACT inhaler Inhale 2 puffs into the lungs every 6 (six) hours as needed for wheezing or shortness of breath. Reported on 09/09/2015 12/07/15   [place] N Rumley, DO  fluticasone (CUTIVATE) 0.05 % cream Apply topically 2 (two) times daily. 03/17/16   Linna Hoff, MD  medroxyPROGESTERone (PROVERA) 10 MG tablet Take 1 tablet (10 mg total) by mouth daily. Patient not taking: Reported on 02/17/2016 12/07/15   [place] N Rumley, DO  ondansetron (ZOFRAN) 4 MG tablet Take 1 tablet (4 mg total) by mouth every 8 (eight) hours as needed for nausea or vomiting. Patient not taking: Reported on 08/04/2015 07/23/15   Charm Rings, MD  senna [facility])  8.6 MG TABS tablet Take 2 tablets (17.2 mg total) by mouth daily. Patient not taking: Reported on 02/17/2016 12/08/15   Lora Havensaleigh N Rumley, DO  traMADol (ULTRAM) 50 MG tablet Take 1 tablet (50 mg total) by mouth every 6 (six) hours as needed. 02/17/16   Nelva Nayobert Beaton, MD   Meds Ordered and Administered this Visit  Medications - No data to display  BP 119/87 (BP Location: Right Arm)   Pulse 85   Temp 98.4 F (36.9 C) (Oral)   Resp 18   LMP 10/02/2016   SpO2 100%  No data found.   Physical Exam  Constitutional: She is oriented to person, place, and time. She appears well-developed and well-nourished. No  distress.  HENT:  Head: Normocephalic and atraumatic.  Eyes: EOM are normal.  Neck: Normal range of motion. Neck supple.  Cardiovascular: Normal rate, regular rhythm, normal heart sounds and intact distal pulses.   Pulmonary/Chest: Effort normal and breath sounds normal. No respiratory distress. She has no rales. She exhibits tenderness.  Palpation of the right upper anterior chest wall reproduces the same pain for which she experienced this morning and afternoon. It reproduces the pain in which she has now. This was performed twice to validate results.  Musculoskeletal: Normal range of motion.  Lymphadenopathy:    She has no cervical adenopathy.  Neurological: She is alert and oriented to person, place, and time.  Skin: Skin is warm and dry.  Nursing note and vitals reviewed.   Urgent Care Course     Procedures (including critical care time)  Labs Review Labs Reviewed - No data to display  Imaging Review Dg Chest 2 View  Result Date: 10/23/2016 CLINICAL DATA:  Chest pain and shortness of breath EXAM: CHEST  2 VIEW COMPARISON:  February 17, 2016 FINDINGS: There is no edema or consolidation. Heart size and pulmonary vascular normal. No adenopathy. No pneumothorax. No bone lesions. IMPRESSION: No edema or consolidation. Electronically Signed   By: Bretta BangWilliam  Woodruff III M.D.   On: 10/23/2016 12:38     Visual Acuity Review  Right Eye Distance:   Left Eye Distance:   Bilateral Distance:    Right Eye Near:   Left Eye Near:    Bilateral Near:         MDM   1. Chest wall pain   2. Anxiety   3. Costochondritis    The patient was evaluated in the emergency department earlier this afternoon for chest pain. Results of the CBC, CMP, first troponin, EKG and preg test were reviewed and all were within normal limits.  The pain in the right chest is due to inflammation of the muscles and ribs. This does not involve her heart or lungs. He may treat this with applying cold compresses or  ice packs, taking ibuprofen or Aleve and limiting heavy lifting or repetitive movement with the right arm. This may last for several days or even weeks. It is not serious and will eventually go away. Follow-up with your primary care doctor as needed. All of the test performed in emergency Department were reviewed and they are were no indications of heart problems based on the data, even though it was not complete.    Hayden Rasmussenavid Christionna Poland, NP 10/23/16 1535

## 2016-10-23 NOTE — Discharge Instructions (Signed)
The pain in the right chest is due to inflammation of the muscles and ribs. This does not involve her heart or lungs. He may treat this with applying cold compresses or ice packs, taking ibuprofen or Aleve and limiting heavy lifting or repetitive movement with the right arm. This may last for several days or even weeks. It is not serious and will eventually go away. Follow-up with your primary care doctor as needed. All of the test performed in emergency Department were reviewed and they are were no indications of heart problems based on the data, even though it was not complete.

## 2016-10-23 NOTE — ED Triage Notes (Signed)
Here for chest pain States she was ED States she is stress due to job

## 2016-10-23 NOTE — ED Triage Notes (Signed)
Pt states acute onset chest pain after having meeting with her "nit picky supervisor", accompanied by sob.  Hx of anxiety.

## 2017-02-07 ENCOUNTER — Ambulatory Visit: Payer: Medicaid Other | Admitting: Family Medicine

## 2017-02-21 ENCOUNTER — Ambulatory Visit (INDEPENDENT_AMBULATORY_CARE_PROVIDER_SITE_OTHER): Payer: Medicaid Other | Admitting: Family Medicine

## 2017-02-21 ENCOUNTER — Other Ambulatory Visit: Payer: Self-pay | Admitting: Obstetrics and Gynecology

## 2017-02-21 ENCOUNTER — Other Ambulatory Visit (HOSPITAL_COMMUNITY)
Admission: RE | Admit: 2017-02-21 | Discharge: 2017-02-21 | Disposition: A | Discharge status: Home / Self Care | Payer: Medicaid Other | Source: Ambulatory Visit | Attending: Family Medicine | Admitting: Family Medicine

## 2017-02-21 ENCOUNTER — Encounter: Payer: Self-pay | Admitting: Obstetrics and Gynecology

## 2017-02-21 VITALS — BP 110/80 | HR 90 | Temp 98.6°F | Wt 313.0 lb

## 2017-02-21 DIAGNOSIS — Z124 Encounter for screening for malignant neoplasm of cervix: Secondary | ICD-10-CM

## 2017-02-21 DIAGNOSIS — Z308 Encounter for other contraceptive management: Secondary | ICD-10-CM

## 2017-02-21 DIAGNOSIS — Z3202 Encounter for pregnancy test, result negative: Secondary | ICD-10-CM | POA: Diagnosis not present

## 2017-02-21 DIAGNOSIS — Z7251 High risk heterosexual behavior: Secondary | ICD-10-CM | POA: Diagnosis not present

## 2017-02-21 DIAGNOSIS — Z30019 Encounter for initial prescription of contraceptives, unspecified: Secondary | ICD-10-CM

## 2017-02-21 LAB — POCT WET PREP (WET MOUNT)
CLUE CELLS WET PREP WHIFF POC: POSITIVE
Trichomonas Wet Prep HPF POC: ABSENT

## 2017-02-21 LAB — POCT URINE PREGNANCY: Preg Test, Ur: NEGATIVE

## 2017-02-21 MED ORDER — MEDROXYPROGESTERONE ACETATE 150 MG/ML IM SUSP
150.0000 mg | Freq: Once | INTRAMUSCULAR | Status: AC
  Administered 2017-02-21: 150 mg via INTRAMUSCULAR

## 2017-02-21 MED ORDER — METRONIDAZOLE 500 MG PO TABS: 500.0000 mg | ORAL_TABLET | Freq: Two times a day (BID) | ORAL | 0 refills | Status: AC

## 2017-02-21 NOTE — Progress Notes (Signed)
Placement of Mirena IUD  Indication: Contraception Assisted by: CMA  Patient was counseled regarding the risks/benefits/alternatives of the IUD. Urine pregnancy test negative . Consent was obtained and all questions answered.  Time out performed.  Description: Cervix was swabbed three times with Betadine swabs. Sterile gloves donned. Sterile single-tooth tenaculum used to grasp anterior lip of the cervix and straighten the endocervical canal. Uterus sounded to 3 cm.  Do not feel that this was full length of her cervix. Clinic was out of cervical dilators so unable to dilate cervical os. Patient has been pregnant before but had c-section. It was decided to abort procedure Patient to return to clinic when materials reordered for Mirena insertion.   Lisa AdaJazma Phelps, DO 02/21/2017, 11:20 AM

## 2017-02-21 NOTE — Progress Notes (Signed)
Tried calling patient but there was no answer.  Will continue to try and reach her on Monday. Gladies Sofranko,CMA

## 2017-02-21 NOTE — Patient Instructions (Signed)
Received Depo today. Next injection will be due between September 13th to September 27th.  Will still recommend Mirena IUD for you. You can schedule anytime to have it done. Before the procedure please take 800mg  of Ibuprofen one hour before to reduce discomfort. Will call with results from pap today.  Levonorgestrel intrauterine device (IUD) What is this medicine? LEVONORGESTREL IUD (LEE voe nor jes trel) is a contraceptive (birth control) device. The device is placed inside the uterus by a healthcare professional. It is used to prevent pregnancy. This device can also be used to treat heavy bleeding that occurs during your period. This medicine may be used for other purposes; ask your health care provider or pharmacist if you have questions. COMMON BRAND NAME(S): Cameron Ali What should I tell my health care provider before I take this medicine? They need to know if you have any of these conditions: -abnormal Pap smear -cancer of the breast, uterus, or cervix -diabetes -endometritis -genital or pelvic infection now or in the past -have more than one sexual partner or your partner has more than one partner -heart disease -history of an ectopic or tubal pregnancy -immune system problems -IUD in place -liver disease or tumor -problems with blood clots or take blood-thinners -seizures -use intravenous drugs -uterus of unusual shape -vaginal bleeding that has not been explained -an unusual or allergic reaction to levonorgestrel, other hormones, silicone, or polyethylene, medicines, foods, dyes, or preservatives -pregnant or trying to get pregnant -breast-feeding How should I use this medicine? This device is placed inside the uterus by a health care professional. Talk to your pediatrician regarding the use of this medicine in children. Special care may be needed. Overdosage: If you think you have taken too much of this medicine contact a poison control center or  emergency room at once. NOTE: This medicine is only for you. Do not share this medicine with others. What if I miss a dose? This does not apply. Depending on the brand of device you have inserted, the device will need to be replaced every 3 to 5 years if you wish to continue using this type of birth control. What may interact with this medicine? Do not take this medicine with any of the following medications: -amprenavir -bosentan -fosamprenavir This medicine may also interact with the following medications: -aprepitant -armodafinil -barbiturate medicines for inducing sleep or treating seizures -bexarotene -boceprevir -griseofulvin -medicines to treat seizures like carbamazepine, ethotoin, felbamate, oxcarbazepine, phenytoin, topiramate -modafinil -pioglitazone -rifabutin -rifampin -rifapentine -some medicines to treat HIV infection like atazanavir, efavirenz, indinavir, lopinavir, nelfinavir, tipranavir, ritonavir -St. John's wort -warfarin This list may not describe all possible interactions. Give your health care provider a list of all the medicines, herbs, non-prescription drugs, or dietary supplements you use. Also tell them if you smoke, drink alcohol, or use illegal drugs. Some items may interact with your medicine. What should I watch for while using this medicine? Visit your doctor or health care professional for regular check ups. See your doctor if you or your partner has sexual contact with others, becomes HIV positive, or gets a sexual transmitted disease. This product does not protect you against HIV infection (AIDS) or other sexually transmitted diseases. You can check the placement of the IUD yourself by reaching up to the top of your vagina with clean fingers to feel the threads. Do not pull on the threads. It is a good habit to check placement after each menstrual period. Call your doctor right away if you feel more  of the IUD than just the threads or if you cannot feel  the threads at all. The IUD may come out by itself. You may become pregnant if the device comes out. If you notice that the IUD has come out use a backup birth control method like condoms and call your health care provider. Using tampons will not change the position of the IUD and are okay to use during your period. This IUD can be safely scanned with magnetic resonance imaging (MRI) only under specific conditions. Before you have an MRI, tell your healthcare provider that you have an IUD in place, and which type of IUD you have in place. What side effects may I notice from receiving this medicine? Side effects that you should report to your doctor or health care professional as soon as possible: -allergic reactions like skin rash, itching or hives, swelling of the face, lips, or tongue -fever, flu-like symptoms -genital sores -high blood pressure -no menstrual period for 6 weeks during use -pain, swelling, warmth in the leg -pelvic pain or tenderness -severe or sudden headache -signs of pregnancy -stomach cramping -sudden shortness of breath -trouble with balance, talking, or walking -unusual vaginal bleeding, discharge -yellowing of the eyes or skin Side effects that usually do not require medical attention (report to your doctor or health care professional if they continue or are bothersome): -acne -breast pain -change in sex drive or performance -changes in weight -cramping, dizziness, or faintness while the device is being inserted -headache -irregular menstrual bleeding within first 3 to 6 months of use -nausea This list may not describe all possible side effects. Call your doctor for medical advice about side effects. You may report side effects to FDA at 1-800-FDA-1088. Where should I keep my medicine? This does not apply. NOTE: This sheet is a summary. It may not cover all possible information. If you have questions about this medicine, talk to your doctor, pharmacist, or  health care provider.  2018 Elsevier/Gold Standard (2016-05-24 14:14:56)

## 2017-02-21 NOTE — Progress Notes (Signed)
Subjective: Chief Complaint  Patient presents with  . Contraception  . Unprotected Sex    HPI: Lisa Marshall is a 21 y.o. presenting to clinic today to discuss the following:  # Contraception. Patient states she wants birth control and was interested in Depo. She has had depo in the past and did experience weight gain. She is open to other options. Wanted an option that would cause her periods to stop. Denied vaginal discharge, abdominal pain, irregular bleeding.  # Unprotected sex. Patient states she had unprotected sex on March 19th and wanted to be checked for STDs. Denies any concern for vaginal discharge. Patient was concerned about a bump on her labia.   Health Maintenance: pap due     ROS noted in HPI.   Past Medical, Surgical, Social, and Family History Reviewed & Updated per EMR.   Pertinent Historical Findings include: Obesity and mood disorder.   History  Smoking Status  . Never Smoker  Smokeless Tobacco  . Never Used    Objective: Wt (!) 313 lb (142 kg)   LMP 01/27/2017   BMI 55.45 kg/m  Vitals and nursing notes reviewed  Physical Exam  Constitutional: She is well-developed, well-nourished, and in no distress.  obese  Eyes: EOM are normal.  Neck: Normal range of motion.  Cardiovascular: Normal rate.   Pulmonary/Chest: Effort normal.  Abdominal: Soft. There is no tenderness. There is no guarding.  Genitourinary: Uterus normal, cervix normal, right adnexa normal, left adnexa normal and vulva normal. Cervix exhibits no motion tenderness. Vulva exhibits no lesion and no rash. Vagina exhibits normal mucosa and no lesion. Creamy  white and vaginal discharge found.  Skin: Skin is warm and dry.  Psychiatric: Affect normal.     Results for orders placed or performed in visit on 02/21/17 (from the past 72 hour(s))  POCT urine pregnancy     Status: None   Collection Time: 02/21/17 10:21 AM  Result Value Ref Range   Preg Test, Ur Negative Negative    POCT Wet Prep [facility] [place])     Status: Abnormal   Collection Time: 02/21/17 11:25 AM  Result Value Ref Range   Source Wet Prep POC VAG    WBC, Wet Prep HPF POC 1-5    Bacteria Wet Prep HPF POC Moderate (A) Few   Clue Cells Wet Prep HPF POC Moderate (A) None   Clue Cells Wet Prep Whiff POC Positive Whiff    Yeast Wet Prep HPF POC None    Trichomonas Wet Prep HPF POC Absent Absent    Assessment/Plan: 1. Encounter for initial prescription of contraceptives, unspecified contraceptive We discussed contraceptive methods available to the patient. Patient agreed to have Mirena placed but attempt was not successful due to no cervical dilators available. Patient had previous c-section. She received Depo shot today and will attempt Mirena at a later date. - POCT urine pregnancy  2. Screening for cervical cancer Pap smear was performed per routine health maintence. - Cytology - PAP [place]  3. Unprotected sex Discussed health benefits of safe sex practices with patient. Patient wanted to be tested for STDs. Collected sample from patient and we will contact her with results. - POCT Wet Prep [facility]([place] [place])  PATIENT EDUCATION PROVIDED: See AVS    Orders Placed This Encounter  Procedures  . POCT urine pregnancy  . POCT Wet Prep [facility]([place] [place])    No orders of the defined types were placed in this encounter.    Jules Schickim Breanne Olvera,  DO 02/21/2017, 10:30 AM PGY-1, [facility]

## 2017-02-24 ENCOUNTER — Telehealth: Payer: Self-pay | Admitting: Family Medicine

## 2017-02-24 NOTE — Telephone Encounter (Signed)
Attempted to contact pt, however, no answer. VM was left asking for her to return my phone call.

## 2017-02-24 NOTE — Telephone Encounter (Signed)
Pt would like someone to call her with lab results. ep °

## 2017-02-25 LAB — CYTOLOGY - PAP
CHLAMYDIA, DNA PROBE: NEGATIVE
Diagnosis: NEGATIVE
Neisseria Gonorrhea: NEGATIVE

## 2017-02-25 NOTE — Telephone Encounter (Signed)
Pt returned call, negative results were given for pap and GC/CT. Pt was informed of positive BV and antibiotic to he pharmacy.

## 2017-02-25 NOTE — Telephone Encounter (Signed)
Attempted to call pt again. No answer and no option for VM.

## 2017-03-04 ENCOUNTER — Telehealth: Payer: Self-pay | Admitting: Family Medicine

## 2017-04-10 NOTE — Telephone Encounter (Signed)
Patient was informed of test result and sent Metronidazole.

## 2017-05-04 ENCOUNTER — Encounter (HOSPITAL_COMMUNITY): Payer: Self-pay | Admitting: *Deleted

## 2017-05-04 ENCOUNTER — Ambulatory Visit (HOSPITAL_COMMUNITY)
Admission: EM | Admit: 2017-05-04 | Discharge: 2017-05-04 | Disposition: A | Discharge status: Home / Self Care | Payer: Self-pay | Attending: Family Medicine | Admitting: Family Medicine

## 2017-05-04 DIAGNOSIS — R358 Other polyuria: Secondary | ICD-10-CM

## 2017-05-04 DIAGNOSIS — E1165 Type 2 diabetes mellitus with hyperglycemia: Secondary | ICD-10-CM

## 2017-05-04 DIAGNOSIS — R631 Polydipsia: Secondary | ICD-10-CM

## 2017-05-04 DIAGNOSIS — Z3202 Encounter for pregnancy test, result negative: Secondary | ICD-10-CM

## 2017-05-04 LAB — POCT URINALYSIS DIP (DEVICE)
Bilirubin Urine: NEGATIVE
Glucose, UA: 500 mg/dL — AB
Leukocytes, UA: NEGATIVE
Nitrite: NEGATIVE
PH: 6 (ref 5.0–8.0)
PROTEIN: NEGATIVE mg/dL
Specific Gravity, Urine: <=1.005 (ref 1.005–1.030)
Urobilinogen, UA: 0.2 mg/dL (ref 0.0–1.0)

## 2017-05-04 LAB — GLUCOSE, CAPILLARY: Glucose-Capillary: 594 mg/dL (ref 65–99)

## 2017-05-04 LAB — POCT PREGNANCY, URINE: PREG TEST UR: NEGATIVE

## 2017-05-04 NOTE — ED Triage Notes (Signed)
C/O polyuria x 2 days without dysuria.  Had Depo shot 1 month ago.

## 2017-05-04 NOTE — ED Provider Notes (Signed)
[facility]    CSN: [id number] Arrival date & time: 05/04/17  1310     History   Chief Complaint Chief Complaint  Patient presents with  . Polyuria  . Possible Pregnancy    HPI Lisa Marshall is a 21 y.o. female.   HPI  Pt has been having increased thirst, PO intake of fluids and increased urination over the past couple days. She knows she is overweight and has been trying to do the keto diet, having lost around 12 lbs. She does have a family hx of diabetes. She is physically active, but admits her diet could be better.   Past Medical History:  Diagnosis Date  . Asthma   . Depression   . History of PID   . Pregnancy, multiple   . Syncope     Patient Active Problem List   Diagnosis Date Noted  . Amenorrhea 12/08/2015  . Encounter for surveillance of injectable contraceptive 12/23/2014  . Mood disorder, unspecified episodic  06/21/2013  . Obesity, unspecified 11/06/2007  . GERD 11/06/2007  . ASTHMA, INTERMITTENT 10/23/2006    Past Surgical History:  Procedure Laterality Date  . ADENOIDECTOMY    . CESAREAN SECTION N/A 11/18/2013   Procedure: Primary Cesarean Section Delivery Baby "A" Girl @ 0447, Apgars 1/2/4, Baby "B" @ 0450, Apgars 8/9;  Surgeon: Kathreen Cosier, MD;  Location: [facility];  Service: Obstetrics;  Laterality: N/A;  . TONSILLECTOMY      OB History    Gravida Para Term Preterm AB Living   0 0 2   SAB TAB Ectopic Multiple Live Births   0 0 0 1 2       Home Medications    Prior to Admission medications   Medication Sig Start Date End Date Taking? Authorizing Provider  MedroxyPROGESTERone Acetate (DEPO-PROVERA IM) Inject into the muscle.   Yes [provider]    Family History Family History  Problem Relation Age of Onset  . Asthma Other   . Diabetes Other   . Cancer Other   . Sickle cell anemia Mother     Social History Social History  Substance Use Topics  . Smoking status: Never Smoker  . Smokeless  tobacco: Never Used  . Alcohol use No     Allergies   Penicillins   Review of Systems Review of Systems  Genitourinary: Positive for frequency.  Endo: +polydipsia   Physical Exam Triage Vital Signs ED Triage Vitals [05/04/17 1456]  Enc Vitals Group     BP 119/82     Pulse Rate 91     Resp 18     Temp 98.2 F (36.8 C)     Temp Source Oral     SpO2 98 %   Updated Vital Signs BP 119/82   Pulse 91   Temp 98.2 F (36.8 C) (Oral)   Resp 18   LMP 05/02/2017 (Exact Date)   SpO2 98%   Physical Exam  Constitutional: She is oriented to person, place, and time. She appears well-developed and well-nourished. No distress.  HENT:  Head: Normocephalic and atraumatic.  Mouth/Throat: Oropharynx is clear and moist. No oropharyngeal exudate.  Cardiovascular: Normal rate and regular rhythm.   No murmur heard. Pulmonary/Chest: Effort normal and breath sounds normal. No respiratory distress.  Abdominal: Soft. Bowel sounds are normal. She exhibits no distension. There is no tenderness.  Neurological: She is alert and oriented to person, place, and time.  Skin: Skin is warm. She is  not diaphoretic.  Psychiatric: She has a normal mood and affect. Judgment normal.     UC Treatments / Results  Labs (all labs ordered are listed, but only abnormal results are displayed) Labs Reviewed  GLUCOSE, CAPILLARY - Abnormal; Notable for the following:       Result Value   Glucose-Capillary 594 (*)    All other components within normal limits  POCT URINALYSIS DIP (DEVICE) - Abnormal; Notable for the following:    Glucose, UA 500 (*)    Ketones, ur >=160 (*)    Hgb urine dipstick MODERATE (*)    All other components within normal limits  POCT PREGNANCY, URINE    Procedures Procedures   Initial Impression / Assessment and Plan / UC Course  I have reviewed the triage vital signs and the nursing notes.  Pertinent labs & imaging results that were available during my care of the patient  were reviewed by me and considered in my medical decision making (see chart for details).     Pt meets criteria for DM with random BS>200 in presence of symptoms. She follows with Dr. Lum BabeEniola and I encouraged her that she is in good hands. We discussed diabetes, complications, management/monitoring and follow up care. We also discussed physical activity, which sounds like she is doing a reasonably good job with. I also spent time discussing diet with her. She has been very thirsty lately and has been drinking a lot of soda. I recommended water. I gave her a healthy diet handout for heart health and portion control. She understands to follow up with her PCP this week to discuss management moving forward. The patient voiced understanding and agreement to the plan.   Greater than 15 minutes were spent face to face with the patient with greater than 50% of this time spent counseling on diet, exercise, and diabetes management/prognosis.    Final Clinical Impressions(s) / UC Diagnoses   Final diagnoses:  Type 2 diabetes mellitus with hyperglycemia, without long-term current use of insulin (HCC)    Controlled Substance Prescriptions  Controlled Substance Registry consulted? Not Applicable   Sharlene DoryWendling, Blossie Raffel Paul, [place] 05/04/17 1650

## 2017-05-04 NOTE — Discharge Instructions (Signed)
Drink water, not soda.  Healthy Eating Plan Many factors influence your heart health, including eating and exercise habits. Heart (coronary) risk increases with abnormal blood fat (lipid) levels. Heart-healthy meal planning includes limiting unhealthy fats, increasing healthy fats, and making other small dietary changes. This includes maintaining a healthy body weight to help keep lipid levels within a normal range.  WHAT IS MY PLAN?  Your health care provider recommends that you: Drink a glass of water before meals to help with satiety. Eat slowly. An alternative to the water is to add Metamucil. This will help with satiety as well. It does contain calories, unlike water.  WHAT TYPES OF FAT SHOULD I CHOOSE? Choose healthy fats more often. Choose monounsaturated and polyunsaturated fats, such as olive oil and canola oil, flaxseeds, walnuts, almonds, and seeds. Eat more omega-3 fats. Good choices include salmon, mackerel, sardines, tuna, flaxseed oil, and ground flaxseeds. Aim to eat fish at least two times each week. Avoid foods with partially hydrogenated oils in them. These contain trans fats. Examples of foods that contain trans fats are stick margarine, some tub margarines, cookies, crackers, and other baked goods. If you are going to avoid a fat, this is the one to avoid!  WHAT GENERAL GUIDELINES DO I NEED TO FOLLOW? Check food labels carefully to identify foods with trans fats. Avoid these types of options when possible. Fill one half of your plate with vegetables and green salads. Eat 4-5 servings of vegetables per day. A serving of vegetables equals 1 cup of raw leafy vegetables,  cup of raw or cooked cut-up vegetables, or  cup of vegetable juice. Fill one fourth of your plate with whole grains. Look for the word "whole" as the first word in the ingredient list. Fill one fourth of your plate with lean protein foods. Eat 4-5 servings of fruit per day. A serving of fruit equals one  medium whole fruit,  cup of dried fruit,  cup of fresh, frozen, or canned fruit. Try to avoid fruits in cups/syrups as the sugar content can be high. Eat more foods that contain soluble fiber. Examples of foods that contain this type of fiber are apples, broccoli, carrots, beans, peas, and barley. Aim to get 20-30 g of fiber per day. Eat more home-cooked food and less restaurant, buffet, and fast food. Limit or avoid alcohol. Limit foods that are high in starch and sugar. Avoid fried foods when able. Cook foods by using methods other than frying. Baking, boiling, grilling, and broiling are all great options. Other fat-reducing suggestions include: Removing the skin from poultry. Removing all visible fats from meats. Skimming the fat off of stews, soups, and gravies before serving them. Steaming vegetables in water or broth. Lose weight if you are overweight. Losing just 5-10% of your initial body weight can help your overall health and prevent diseases such as diabetes and heart disease. Increase your consumption of nuts, legumes, and seeds to 4-5 servings per week. One serving of dried beans or legumes equals  cup after being cooked, one serving of nuts equals 1 ounces, and one serving of seeds equals  ounce or 1 tablespoon.  WHAT ARE GOOD FOODS CAN I EAT? Grains Grainy breads (try to find bread that is 3 g of fiber per slice or greater), oatmeal, light popcorn. Whole-grain cereals. Rice and pasta, including brown rice and those that are made with whole wheat. Edamame pasta is a great alternative to grain pasta. It has a higher protein content. Try to avoid  significant consumption of white bread, sugary cereals, or pastries/baked goods.  Vegetables All vegetables. Cooked white potatoes do not count as vegetables.  Fruits All fruits, but limit pineapple and bananas as these fruits have a higher sugar content.  Meats and Other Protein Sources Lean, well-trimmed beef, veal, pork, and  lamb. Chicken and [place] without skin. All fish and shellfish. Wild duck, rabbit, pheasant, and venison. Egg whites or low-cholesterol egg substitutes. Dried beans, peas, lentils, and tofu. Seeds and most nuts.  Dairy Low-fat or nonfat cheeses, including ricotta, string, and mozzarella. Skim or 1% milk that is liquid, powdered, or evaporated. Buttermilk that is made with low-fat milk. Nonfat or low-fat yogurt. Soy/Almond milk are good alternatives if you cannot handle dairy.  Beverages Water is the best for you. Sports drinks with less sugar are more desirable unless you are a highly active athlete.  Sweets and Desserts Sherbets and fruit ices. Honey, jam, marmalade, jelly, and syrups. Dark chocolate.  Eat all sweets and desserts in moderation.  Fats and Oils Nonhydrogenated (trans-free) margarines. Vegetable oils, including soybean, sesame, sunflower, olive, peanut, safflower, corn, canola, and cottonseed. Salad dressings or mayonnaise that are made with a vegetable oil. Limit added fats and oils that you use for cooking, baking, salads, and as spreads.  Other Cocoa powder. Coffee and tea. Most condiments.  The items listed above may not be a complete list of recommended foods or beverages. Contact your dietitian for more options.

## 2017-05-09 ENCOUNTER — Emergency Department (HOSPITAL_COMMUNITY)
Admission: EM | Admit: 2017-05-09 | Discharge: 2017-05-09 | Disposition: A | Discharge status: Home / Self Care | Payer: Self-pay | Attending: Emergency Medicine | Admitting: Emergency Medicine

## 2017-05-09 ENCOUNTER — Ambulatory Visit (HOSPITAL_COMMUNITY)
Admission: EM | Admit: 2017-05-09 | Discharge: 2017-05-09 | Disposition: A | Discharge status: Home / Self Care | Payer: Medicaid Other | Attending: Family Medicine | Admitting: Family Medicine

## 2017-05-09 ENCOUNTER — Encounter (HOSPITAL_COMMUNITY): Payer: Self-pay | Admitting: *Deleted

## 2017-05-09 ENCOUNTER — Encounter (HOSPITAL_COMMUNITY): Payer: Self-pay | Admitting: Family Medicine

## 2017-05-09 DIAGNOSIS — J45909 Unspecified asthma, uncomplicated: Secondary | ICD-10-CM | POA: Insufficient documentation

## 2017-05-09 DIAGNOSIS — M545 Low back pain, unspecified: Secondary | ICD-10-CM

## 2017-05-09 DIAGNOSIS — R739 Hyperglycemia, unspecified: Secondary | ICD-10-CM | POA: Insufficient documentation

## 2017-05-09 LAB — CBG MONITORING, ED
GLUCOSE-CAPILLARY: 465 mg/dL — AB (ref 65–99)
GLUCOSE-CAPILLARY: 407 mg/dL — AB (ref 65–99)
GLUCOSE-CAPILLARY: 353 mg/dL — AB (ref 65–99)

## 2017-05-09 LAB — BASIC METABOLIC PANEL
ANION GAP: 11 (ref 5–15)
BUN: 6 mg/dL (ref 6–20)
CHLORIDE: 104 mmol/L (ref 101–111)
CO2: 14 mmol/L — AB (ref 22–32)
Calcium: 8.9 mg/dL (ref 8.9–10.3)
Creatinine, Ser: 0.85 mg/dL (ref 0.44–1.00)
GFR calc non Af Amer: >60 mL/min (ref >60)
GLUCOSE: 521 mg/dL — AB (ref 65–99)
Potassium: 4.1 mmol/L (ref 3.5–5.1)
Sodium: 129 mmol/L — ABNORMAL LOW (ref 135–145)

## 2017-05-09 LAB — URINALYSIS, ROUTINE W REFLEX MICROSCOPIC
BACTERIA UA: NONE SEEN
Bilirubin Urine: NEGATIVE
Glucose, UA: >=500 mg/dL — AB
KETONES UR: 80 mg/dL — AB
Leukocytes, UA: NEGATIVE
Nitrite: NEGATIVE
PROTEIN: NEGATIVE mg/dL
RBC / HPF: NONE SEEN RBC/hpf (ref 0–5)
Specific Gravity, Urine: 1.03 (ref 1.005–1.030)
pH: 5 (ref 5.0–8.0)

## 2017-05-09 LAB — CBC
HEMATOCRIT: 37.1 % (ref 36.0–46.0)
HEMOGLOBIN: 12.5 g/dL (ref 12.0–15.0)
MCH: 28 pg (ref 26.0–34.0)
MCHC: 33.7 g/dL (ref 30.0–36.0)
MCV: 83 fL (ref 78.0–100.0)
Platelets: 339 10*3/uL (ref 150–400)
RBC: 4.47 MIL/uL (ref 3.87–5.11)
RDW: 13.1 % (ref 11.5–15.5)
WBC: 5.5 10*3/uL (ref 4.0–10.5)

## 2017-05-09 LAB — GLUCOSE, CAPILLARY: Glucose-Capillary: 502 mg/dL (ref 65–99)

## 2017-05-09 MED ORDER — CYCLOBENZAPRINE HCL 5 MG PO TABS: 5.0000 mg | ORAL_TABLET | Freq: Two times a day (BID) | ORAL | 0 refills | Status: DC | PRN

## 2017-05-09 MED ORDER — METFORMIN HCL 500 MG PO TABS: 500.0000 mg | ORAL_TABLET | Freq: Two times a day (BID) | ORAL | 0 refills | Status: DC

## 2017-05-09 MED ORDER — METFORMIN HCL 500 MG PO TABS
500.0000 mg | ORAL_TABLET | Freq: Once | ORAL | Status: AC
  Administered 2017-05-09: 500 mg via ORAL
  Filled 2017-05-09: qty 1

## 2017-05-09 MED ORDER — SODIUM CHLORIDE 0.9 % IV BOLUS (SEPSIS)
1000.0000 mL | Freq: Once | INTRAVENOUS | Status: AC
  Administered 2017-05-09: 1000 mL via INTRAVENOUS

## 2017-05-09 NOTE — ED Provider Notes (Signed)
[facility] Provider Note   CSN: [id number] Arrival date & time: 05/09/17  1225     History   Chief Complaint Chief Complaint  Patient presents with  . Hyperglycemia    HPI Lisa Marshall is a 21 y.o. female.  The history is provided by the patient.  Hyperglycemia  Blood sugar level PTA:  >500 Severity:  Moderate Onset quality:  Gradual Duration: unknown. Timing:  Unable to specify Progression:  Unable to specify Chronicity:  New Diabetes status:  Non-diabetic Relieved by:  Nothing Ineffective treatments:  None tried Associated symptoms: dehydration, dizziness, increased appetite, increased thirst and polyuria   Associated symptoms: no abdominal pain, no diaphoresis, no nausea and no vomiting     Past Medical History:  Diagnosis Date  . Asthma   . Depression   . History of PID   . Pregnancy, multiple   . Syncope     Patient Active Problem List   Diagnosis Date Noted  . Amenorrhea 12/08/2015  . Encounter for surveillance of injectable contraceptive 12/23/2014  . Mood disorder, unspecified episodic  06/21/2013  . Obesity, unspecified 11/06/2007  . GERD 11/06/2007  . ASTHMA, INTERMITTENT 10/23/2006    Past Surgical History:  Procedure Laterality Date  . ADENOIDECTOMY    . CESAREAN SECTION N/A 11/18/2013   Procedure: Primary Cesarean Section Delivery Baby "A" Girl @ 0447, Apgars 1/2/4, Baby "B" @ 0450, Apgars 8/9;  Surgeon: Kathreen Cosier, MD;  Location: [facility];  Service: Obstetrics;  Laterality: N/A;  . TONSILLECTOMY      OB History    Gravida Para Term Preterm AB Living   0 0 2   SAB TAB Ectopic Multiple Live Births   0 0 0 1 2       Home Medications    Prior to Admission medications   Medication Sig Start Date End Date Taking? Authorizing Provider  acetaminophen (TYLENOL) 500 MG tablet Take 500 mg by mouth every 6 (six) hours as needed for headache (pain).   Yes [provider]  albuterol (PROVENTIL HFA;VENTOLIN  HFA) 108 (90 Base) MCG/ACT inhaler Inhale into the lungs every 6 (six) hours as needed for wheezing or shortness of breath.   Yes [provider]  fluticasone (CUTIVATE) 0.05 % cream Apply 1 application topically 2 (two) times daily as needed (irritation between thighs).   Yes [provider]  MedroxyPROGESTERone Acetate (DEPO-PROVERA IM) Inject into the muscle every 3 (three) months. Last injection 1st part of August 2018   Yes [provider]  metFORMIN (GLUCOPHAGE) 500 MG tablet Take 1 tablet (500 mg total) by mouth 2 (two) times daily with a meal. 05/09/17 06/08/17  Cardama, Amadeo Garnet, MD    Family History Family History  Problem Relation Age of Onset  . Asthma Other   . Diabetes Other   . Cancer Other   . Sickle cell anemia Mother     Social History Social History  Substance Use Topics  . Smoking status: Never Smoker  . Smokeless tobacco: Never Used  . Alcohol use No     Allergies   Penicillins   Review of Systems Review of Systems  Constitutional: Negative for diaphoresis.  Gastrointestinal: Negative for abdominal pain, nausea and vomiting.  Endocrine: Positive for polydipsia and polyuria.  Neurological: Positive for dizziness.   All other systems are reviewed and are negative for acute change except as noted in the HPI   Physical Exam Updated Vital Signs BP (!) 137/96 (BP Location:  Left Arm)   Pulse 99   Temp 98 F (36.7 C) (Oral)   Resp 18   LMP 05/02/2017 (Exact Date)   SpO2 100%   Physical Exam  Constitutional: She is oriented to person, place, and time. She appears well-developed and well-nourished. No distress.  Obese  HENT:  Head: Normocephalic and atraumatic.  Nose: Nose normal.  Eyes: Pupils are equal, round, and reactive to light. Conjunctivae and EOM are normal. Right eye exhibits no discharge. Left eye exhibits no discharge. No scleral icterus.  Neck: Normal range of motion. Neck supple.  Cardiovascular: Normal  rate and regular rhythm.  Exam reveals no gallop and no friction rub.   No murmur heard. Pulmonary/Chest: Effort normal and breath sounds normal. No stridor. No respiratory distress. She has no rales.  Abdominal: Soft. She exhibits no distension. There is no tenderness.  Musculoskeletal: She exhibits no edema or tenderness.  Neurological: She is alert and oriented to person, place, and time.  Skin: Skin is warm and dry. No rash noted. She is not diaphoretic. No erythema.  Psychiatric: She has a normal mood and affect.  Vitals reviewed.    ED Treatments / Results  Labs (all labs ordered are listed, but only abnormal results are displayed) Labs Reviewed  BASIC METABOLIC PANEL - Abnormal; Notable for the following:       Result Value   Sodium 129 (*)    CO2 14 (*)    Glucose, Bld 521 (*)    All other components within normal limits  URINALYSIS, ROUTINE W REFLEX MICROSCOPIC - Abnormal; Notable for the following:    Color, Urine STRAW (*)    Glucose, UA >=500 (*)    Hgb urine dipstick LARGE (*)    Ketones, ur 80 (*)    Squamous Epithelial / LPF 0-5 (*)    All other components within normal limits  CBG MONITORING, ED - Abnormal; Notable for the following:    Glucose-Capillary 465 (*)    All other components within normal limits  CBG MONITORING, ED - Abnormal; Notable for the following:    Glucose-Capillary 407 (*)    All other components within normal limits  CBC    EKG  EKG Interpretation None       Radiology No results found.  Procedures Procedures (including critical care time)  Medications Ordered in ED Medications  sodium chloride 0.9 % bolus 1,000 mL (1,000 mLs Intravenous New Bag/Given 05/09/17 1448)  metFORMIN (GLUCOPHAGE) tablet 500 mg (500 mg Oral Given 05/09/17 1431)  sodium chloride 0.9 % bolus 1,000 mL (1,000 mLs Intravenous New Bag/Given 05/09/17 1618)     Initial Impression / Assessment and Plan / ED Course  I have reviewed the triage vital signs and  the nursing notes.  Pertinent labs & imaging results that were available during my care of the patient were reviewed by me and considered in my medical decision making (see chart for details).  Clinical Course as of May 09 1626  Fri May 09, 2017  1527 Presentation consistent with hyperglycemia without evidence of DKA or HHS. Appears to be new diagnosis. On review of record, Blood sugars were in the mid 100s in February. No recent fevers or infections. Pregnancy test negative 5 days ago.  Will hydrate to improve blood sugar levels. Will start patient on metformin and have her follow up closely with her primary care provider for further management of her new onset diabetes.  [PC]  1624 CBG still 400. Additional fluids given.  Patient  care turned over to Dr Tegeler at 1630. Patient case and results discussed in detail; please see their note for further ED managment.      [PC]    Clinical Course User Index [PC] Cardama, Amadeo Garnet, MD      Final Clinical Impressions(s) / ED Diagnoses   Final diagnoses:  Hyperglycemia      Cardama, Amadeo Garnet, MD 05/09/17 1627

## 2017-05-09 NOTE — ED Provider Notes (Signed)
[facility]    CSN: [id number] Arrival date & time: 05/09/17  1043     History   Chief Complaint Chief Complaint  Patient presents with  . Hyperglycemia    HPI Lisa Marshall is a 21 y.o. female.   Pt was here a few days ago and dx with hyperglycemia. sts was suppose to follow up with family practice today but they are closed. She is still having polydipsia and polyuria. sts also nerve pain in back down her legs.   When she was checked a few days ago, her sugar was reported as 594.  She was sent home with no meds.  She is hurting from her posterior neck all the way down her back and into the backs of her legs.      Past Medical History:  Diagnosis Date  . Asthma   . Depression   . History of PID   . Pregnancy, multiple   . Syncope     Patient Active Problem List   Diagnosis Date Noted  . Amenorrhea 12/08/2015  . Encounter for surveillance of injectable contraceptive 12/23/2014  . Mood disorder, unspecified episodic  06/21/2013  . Obesity, unspecified 11/06/2007  . GERD 11/06/2007  . ASTHMA, INTERMITTENT 10/23/2006    Past Surgical History:  Procedure Laterality Date  . ADENOIDECTOMY    . CESAREAN SECTION N/A 11/18/2013   Procedure: Primary Cesarean Section Delivery Baby "A" Girl @ 0447, Apgars 1/2/4, Baby "B" @ 0450, Apgars 8/9;  Surgeon: Kathreen Cosier, MD;  Location: [facility];  Service: Obstetrics;  Laterality: N/A;  . TONSILLECTOMY      OB History    Gravida Para Term Preterm AB Living   0 0 2   SAB TAB Ectopic Multiple Live Births   0 0 0 1 2       Home Medications    Prior to Admission medications   Medication Sig Start Date End Date Taking? Authorizing Provider  MedroxyPROGESTERone Acetate (DEPO-PROVERA IM) Inject into the muscle.    [provider]    Family History Family History  Problem Relation Age of Onset  . Asthma Other   . Diabetes Other   . Cancer Other   . Sickle cell anemia Mother      Social History Social History  Substance Use Topics  . Smoking status: Never Smoker  . Smokeless tobacco: Never Used  . Alcohol use No     Allergies   Penicillins   Review of Systems Review of Systems  Endocrine: Positive for polydipsia and polyuria.  Musculoskeletal: Positive for myalgias.     Physical Exam Triage Vital Signs ED Triage Vitals [05/09/17 1136]  Enc Vitals Group     BP 116/86     Pulse Rate 99     Resp 18     Temp 98.5 F (36.9 C)     Temp src      SpO2 100 %     Weight      Height      Head Circumference      Peak Flow      Pain Score 8     Pain Loc      Pain Edu?      Excl. in GC?    No data found.   Updated Vital Signs BP 116/86   Pulse 99   Temp 98.5 F (36.9 C)   Resp 18   LMP 05/02/2017 (Exact Date)   SpO2 100%  Physical Exam  Constitutional: She is oriented to person, place, and time. She appears well-developed and well-nourished.  HENT:  Head: Normocephalic and atraumatic.  Right Ear: External ear normal.  Left Ear: External ear normal.  Mouth/Throat: Oropharynx is clear and moist.  Eyes: Pupils are equal, round, and reactive to light. Conjunctivae and EOM are normal.  Neck: Normal range of motion. Neck supple.  Cardiovascular: Regular rhythm.   Pulmonary/Chest: Effort normal.  Musculoskeletal: Normal range of motion.  Neurological: She is alert and oriented to person, place, and time.  Skin: Skin is warm and dry.  Nursing note and vitals reviewed.    UC Treatments / Results  Labs (all labs ordered are listed, but only abnormal results are displayed) Labs Reviewed  GLUCOSE, CAPILLARY - Abnormal; Notable for the following:       Result Value   Glucose-Capillary 502 (*)    All other components within normal limits    EKG  EKG Interpretation None       Radiology No results found.  Procedures Procedures (including critical care time)  Medications Ordered in UC Medications - No data to  display   Initial Impression / Assessment and Plan / UC Course  I have reviewed the triage vital signs and the nursing notes.  Pertinent labs & imaging results that were available during my care of the patient were reviewed by me and considered in my medical decision making (see chart for details).     Final Clinical Impressions(s) / UC Diagnoses   Final diagnoses:  Hyperglycemia    New Prescriptions New Prescriptions   No medications on file     Controlled Substance Prescriptions [place] Controlled Substance Registry consulted? Not Applicable   Elvina Sidle, MD 05/09/17 1219

## 2017-05-09 NOTE — Discharge Instructions (Signed)
Your blood sugar and symptoms are too severe to treat today in the Urgent Care.  You need to go to the emergency department now for care.

## 2017-05-09 NOTE — ED Triage Notes (Signed)
Pt was here a few days ago and dx with hyperglycemia. sts was suppose to follow up with family practice today but they are closed. She is still having polydipsia and polyuria. sts also nerve pain in back down her legs.

## 2017-05-09 NOTE — ED Provider Notes (Signed)
Care assumed from Dr. Eudelia Bunch while awaiting reassessment of glucose after fluids. Patient had continued improvement in her glucose.  Patient will be started on metformin for initiation of glucose management and her likely new diagnosis of diabetes. Patient says that she is still having bilateral paraspinal pain going up and down her back. On my assessment, patient did feel to have muscle spasms that were tender to palpation on both sides of her back.   Patient will be started on a very low-dose when necessary prescription ofmuscle relaxant to try and alleviate her symptoms. Patient was informed that she will need to see a PCP in the next several days for both initiation of her glucose and strategies as well as for further discussions of pain management for her back pain. Patient had no other complaints and agreed to plan of care. Return precautions were understood.   Patient discharged in good condition.  Clinical Impression: 1. Hyperglycemia   2. Bilateral low back pain without sciatica, unspecified chronicity     Disposition: Discharge  Condition: Good  I have discussed the results, Dx and Tx plan with the pt(& family if present). He/she/they expressed understanding and agree(s) with the plan. Discharge instructions discussed at great length. Strict return precautions discussed and pt &/or family have verbalized understanding of the instructions. No further questions at time of discharge.    Discharge Medication List as of 05/09/2017  6:14 PM    START taking these medications   Details  cyclobenzaprine (FLEXERIL) 5 MG tablet Take 1 tablet (5 mg total) by mouth 2 (two) times daily as needed for muscle spasms., Starting Fri 05/09/2017, Print    metFORMIN (GLUCOPHAGE) 500 MG tablet Take 1 tablet (500 mg total) by mouth 2 (two) times daily with a meal., Starting Fri 05/09/2017, Until Sun 06/08/2017, Print        Follow Up: Doreene Eland, MD [address] [place]  [address] [phone]  Schedule an appointment as soon as possible for a visit in 1 week For close follow up to assess for new diabetes  [facility] EMERGENCY DEPARTMENT [address] [id number] mc [place] [address] [phone]  If symptoms worsen     Tegeler, Canary Brim, MD 05/10/17 [phone]
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
## 2017-05-09 NOTE — Discharge Instructions (Addendum)
Please take her new diabetes medication and follow-up with a primary care physician for further diabetes management. Please try the muscle relaxant if your back pain on both sides of your back continues. If any symptoms change or worsen, please return to the nearest emergency department.

## 2017-05-09 NOTE — ED Notes (Signed)
CBG-407, Dr notified.

## 2017-05-23 ENCOUNTER — Encounter: Payer: Self-pay | Admitting: Family Medicine

## 2017-05-23 ENCOUNTER — Ambulatory Visit (INDEPENDENT_AMBULATORY_CARE_PROVIDER_SITE_OTHER): Payer: Medicaid Other | Admitting: Family Medicine

## 2017-05-23 VITALS — BP 110/72 | HR 105 | Temp 98.2°F | Wt 291.0 lb

## 2017-05-23 DIAGNOSIS — E119 Type 2 diabetes mellitus without complications: Secondary | ICD-10-CM | POA: Diagnosis present

## 2017-05-23 DIAGNOSIS — Z23 Encounter for immunization: Secondary | ICD-10-CM | POA: Diagnosis not present

## 2017-05-23 DIAGNOSIS — E755 Other lipid storage disorders: Secondary | ICD-10-CM | POA: Diagnosis not present

## 2017-05-23 DIAGNOSIS — Z3042 Encounter for surveillance of injectable contraceptive: Secondary | ICD-10-CM | POA: Diagnosis not present

## 2017-05-23 DIAGNOSIS — F39 Unspecified mood [affective] disorder: Secondary | ICD-10-CM

## 2017-05-23 DIAGNOSIS — E1140 Type 2 diabetes mellitus with diabetic neuropathy, unspecified: Secondary | ICD-10-CM | POA: Insufficient documentation

## 2017-05-23 DIAGNOSIS — Z309 Encounter for contraceptive management, unspecified: Secondary | ICD-10-CM

## 2017-05-23 DIAGNOSIS — R7309 Other abnormal glucose: Secondary | ICD-10-CM | POA: Diagnosis not present

## 2017-05-23 DIAGNOSIS — E1165 Type 2 diabetes mellitus with hyperglycemia: Secondary | ICD-10-CM

## 2017-05-23 LAB — POCT GLYCOSYLATED HEMOGLOBIN (HGB A1C): Hemoglobin A1C: 11.7

## 2017-05-23 LAB — POCT URINE PREGNANCY: Preg Test, Ur: NEGATIVE

## 2017-05-23 LAB — POCT CBG (FASTING - GLUCOSE)-MANUAL ENTRY: Glucose Fasting, POC: 448 mg/dL — AB (ref 70–99)

## 2017-05-23 MED ORDER — MEDROXYPROGESTERONE ACETATE 150 MG/ML IM SUSP
150.0000 mg | Freq: Once | INTRAMUSCULAR | Status: AC
  Administered 2017-05-23: 150 mg via INTRAMUSCULAR

## 2017-05-23 MED ORDER — METFORMIN HCL 500 MG PO TABS: 1000.0000 mg | ORAL_TABLET | Freq: Two times a day (BID) | ORAL | 3 refills | Status: DC

## 2017-05-23 NOTE — Assessment & Plan Note (Signed)
Bipolar and Post partum depression 3 yrs ago. Per patient she is doing well without meds for the past 3 yrs. I will give a letter based on her statement. F/U as needed.

## 2017-05-23 NOTE — Assessment & Plan Note (Addendum)
Uncertain if type 1 or 2. Earlyonset fit type 1 picture but obesity fit type two picture. CBG is elevated in the 400s today. Increase Metformin to 1000 mg BID. I checked C-peptide today. Urine microalbumin ordered. As discussed with patient, if it turn out she has type 1 Dm we will need to start her on Insulin instead. She agreed with plan. FLP checked today. I will discuss ACEi at next visit. Was started on Flexeril prn for neuropathic pain by ED provider. Consider switching Flexeril to Gabapentin at next visit for DM neuropathy. She agreed with plan.

## 2017-05-23 NOTE — Assessment & Plan Note (Signed)
Upreg neg. Depo shot given today.

## 2017-05-23 NOTE — Patient Instructions (Signed)

## 2017-05-23 NOTE — Progress Notes (Signed)
Subjective:     Patient ID: Lisa Marshall, female   DOB: 06-10-1996, 21 y.o.   MRN: [id number]  Diabetes  She presents for her initial diabetic visit. Diabetes type: Likel type two. Onset time: 2-3. Her disease course has been stable. There are no hypoglycemic associated symptoms. (Her sugar has been running high. CBG usually runs in the 400s, highest was 535) Associated symptoms include foot paresthesias, polydipsia, polyphagia and polyuria. Pertinent negatives for diabetes include no blurred vision and no chest pain. Diabetic complications include peripheral neuropathy. Risk factors for coronary artery disease include obesity. Current diabetic treatments: Metformin 500 mg BID. She is compliant with treatment all of the time. Her weight is stable. When asked about meal planning, she reported none. She has not had a previous visit with a dietitian. Exercise: Walk two miles a day. There is no change in her home blood glucose trend. She does not see a podiatrist. Contraception:Used Depo in the past and she will like to continue. Psych:Patient mentioned past hx of Bipolar, OCD, postpartum depression three years ago. She had not needed to be on meds for the last three years and she is doing well. She denies any suicidal ideation. She works and she enjoys school. She need a letter to school stating she is doing well without medication. HM: Need flu shot.   Current Outpatient Prescriptions on File Prior to Visit  Medication Sig Dispense Refill  . acetaminophen (TYLENOL) 500 MG tablet Take 500 mg by mouth every 6 (six) hours as needed for headache (pain).    Marland Kitchen albuterol (PROVENTIL HFA;VENTOLIN HFA) 108 (90 Base) MCG/ACT inhaler Inhale into the lungs every 6 (six) hours as needed for wheezing or shortness of breath.    . cyclobenzaprine (FLEXERIL) 5 MG tablet Take 1 tablet (5 mg total) by mouth 2 (two) times daily as needed for muscle spasms. 10 tablet 0  . fluticasone (CUTIVATE) 0.05 % cream Apply 1  application topically 2 (two) times daily as needed (irritation between thighs).    . MedroxyPROGESTERone Acetate (DEPO-PROVERA IM) Inject into the muscle every 3 (three) months. Last injection 1st part of August 2018    . metFORMIN (GLUCOPHAGE) 500 MG tablet Take 1 tablet (500 mg total) by mouth 2 (two) times daily with a meal. 60 tablet 0   No current facility-administered medications on file prior to visit.    Past Medical History:  Diagnosis Date  . Asthma   . Depression   . History of PID   . Pregnancy, multiple   . Syncope    Vitals:   05/23/17 1126  BP: 110/72  Pulse: (!) 105  Temp: 98.2 F (36.8 C)  TempSrc: Oral  SpO2: 99%  Weight: 291 lb (132 kg)     Review of Systems  Eyes: Negative for blurred vision.  Respiratory: Negative.   Cardiovascular: Negative.  Negative for chest pain.  Gastrointestinal: Negative.   Endocrine: Positive for polydipsia, polyphagia and polyuria.  Genitourinary: Negative.   All other systems reviewed and are negative.      Objective:   Physical Exam  Constitutional: She is oriented to person, place, and time. She appears well-developed. No distress.  Cardiovascular: Normal rate, regular rhythm and normal heart sounds.   No murmur heard. Pulmonary/Chest: Effort normal and breath sounds normal. No respiratory distress. She has no wheezes.  Abdominal: Soft. Bowel sounds are normal. She exhibits no distension and no mass. There is no tenderness.  Musculoskeletal: Normal range of motion. She exhibits no edema.  Sensory exam of the foot is normal, tested with the monofilament. Good pulses, no lesions or ulcers, good peripheral pulses.   Neurological: She is alert and oriented to person, place, and time. No cranial nerve deficit.  Psychiatric: She has a normal mood and affect. Her speech is normal and behavior is normal. Judgment normal. Thought content is not paranoid and not delusional. Cognition and memory are normal. She expresses no  homicidal and no suicidal ideation.  Nursing note and vitals reviewed.      Assessment:     New onset DM Contraceptive management Health maintenance    Plan:     Check problem list.  Flu shot recommended and was given today for health maintenance.

## 2017-05-24 LAB — LIPID PANEL
CHOL/HDL RATIO: 4.8 ratio — AB (ref 0.0–4.4)
Cholesterol, Total: 140 mg/dL (ref 100–199)
HDL: 29 mg/dL — AB (ref >39)
LDL CALC: 64 mg/dL (ref 0–99)
TRIGLYCERIDES: 235 mg/dL — AB (ref 0–149)
VLDL Cholesterol Cal: 47 mg/dL — ABNORMAL HIGH (ref 5–40)

## 2017-05-24 LAB — C-PEPTIDE: C-Peptide: 1.7 ng/mL (ref 1.1–4.4)

## 2017-05-25 ENCOUNTER — Telehealth: Payer: Self-pay | Admitting: Family Medicine

## 2017-05-25 LAB — MICROALBUMIN / CREATININE URINE RATIO
Creatinine, Urine: 41.5 mg/dL
Microalbumin, Urine: <3 ug/mL

## 2017-05-25 NOTE — Telephone Encounter (Signed)
Result discussed with patient. She is instructed to start fish oil 2 tabs BID. I will see her back Oct 5th. She is compliant with Metformin  BID.

## 2017-05-26 ENCOUNTER — Telehealth: Payer: Self-pay | Admitting: *Deleted

## 2017-05-26 ENCOUNTER — Encounter: Payer: Self-pay | Admitting: Family Medicine

## 2017-05-26 NOTE — Telephone Encounter (Signed)
-----   Message from Doreene Eland, MD sent at 05/26/2017  9:05 AM EDT ----- Please call to let patient know that her letter is ready for pick up. Thanks.

## 2017-05-26 NOTE — Telephone Encounter (Signed)
LM for patient that letter is ready for pick up.  [name] [name],CMA  

## 2017-05-30 ENCOUNTER — Encounter: Payer: Self-pay | Admitting: Family Medicine

## 2017-05-30 ENCOUNTER — Ambulatory Visit (INDEPENDENT_AMBULATORY_CARE_PROVIDER_SITE_OTHER): Payer: Self-pay | Admitting: Family Medicine

## 2017-05-30 VITALS — BP 120/72 | HR 95 | Temp 98.4°F | Ht 63.0 in | Wt 284.0 lb

## 2017-05-30 DIAGNOSIS — E119 Type 2 diabetes mellitus without complications: Secondary | ICD-10-CM

## 2017-05-30 DIAGNOSIS — N939 Abnormal uterine and vaginal bleeding, unspecified: Secondary | ICD-10-CM

## 2017-05-30 DIAGNOSIS — E1165 Type 2 diabetes mellitus with hyperglycemia: Secondary | ICD-10-CM

## 2017-05-30 DIAGNOSIS — E781 Pure hyperglyceridemia: Secondary | ICD-10-CM | POA: Insufficient documentation

## 2017-05-30 DIAGNOSIS — E1140 Type 2 diabetes mellitus with diabetic neuropathy, unspecified: Secondary | ICD-10-CM

## 2017-05-30 HISTORY — DX: Abnormal uterine and vaginal bleeding, unspecified: N93.9

## 2017-05-30 LAB — GLUCOSE, POCT (MANUAL RESULT ENTRY): POC Glucose: 548 mg/dl — AB (ref 70–99)

## 2017-05-30 MED ORDER — GLIPIZIDE 10 MG PO TABS: 10.0000 mg | ORAL_TABLET | Freq: Every day | ORAL | 3 refills | Status: DC

## 2017-05-30 MED ORDER — GABAPENTIN 300 MG PO CAPS
300.0000 mg | ORAL_CAPSULE | Freq: Three times a day (TID) | ORAL | 3 refills | Status: DC
Start: 2017-05-30 — End: 2018-08-28

## 2017-05-30 NOTE — Assessment & Plan Note (Signed)
BMI of 50. She lost few pounds in the last few months intentionally. Diet and exercise counseling done. I did referral to the nutritionist and gave her Dr. Gerilyn Pilgrim card to call for an appointment. F/U in 4 weeks for weight management.

## 2017-05-30 NOTE — Progress Notes (Signed)
Subjective:     Patient ID: Lisa Marshall, female   DOB: June 16, 1996, 21 y.o.   MRN: [id number]  HPI DM2/Neuropathy:Here for follow-up. She is compliant with Metformin 1000 mg BID. Her home CBG is still in the 400-500; lowest was 385. She is also concern about burning pain in her feet. She will like to start treatment for this. Denies any numbness or sensory loss. Hypertriglyceridemia:She started Fish oil as instructed. Weight:She is working on diet and exercise. Here for follow-up. Vaginal bleeding:C/O vaginal bleeding since 05/03/27. Bleeding alternate from being heavy to being very light. Today it is just like spotting. Denies abdominal pain. She got Depo shot few weeks ago. Denies vaginal discharge or irritation.  Current Outpatient Prescriptions on File Prior to Visit  Medication Sig Dispense Refill  . albuterol (PROVENTIL HFA;VENTOLIN HFA) 108 (90 Base) MCG/ACT inhaler Inhale into the lungs every 6 (six) hours as needed for wheezing or shortness of breath.    . cyclobenzaprine (FLEXERIL) 5 MG tablet Take 1 tablet (5 mg total) by mouth 2 (two) times daily as needed for muscle spasms. 10 tablet 0  . fluticasone (CUTIVATE) 0.05 % cream Apply 1 application topically 2 (two) times daily as needed (irritation between thighs).    . MedroxyPROGESTERone Acetate (DEPO-PROVERA IM) Inject into the muscle every 3 (three) months. Last injection 1st part of August 2018    . metFORMIN (GLUCOPHAGE) 500 MG tablet Take 2 tablets (1,000 mg total) by mouth 2 (two) times daily with a meal. 120 tablet 3   No current facility-administered medications on file prior to visit.    Past Medical History:  Diagnosis Date  . Asthma   . Depression   . History of PID   . Pregnancy, multiple   . Syncope    Vitals:   05/30/17 1122  BP: 120/72  Pulse: 95  Temp: 98.4 F (36.9 C)  TempSrc: Oral  SpO2: 99%  Weight: 284 lb (128.8 kg)  Height:  (1.6 m)     Review of Systems  Respiratory: Negative.     Cardiovascular: Negative.   Gastrointestinal: Negative.   Genitourinary: Positive for menstrual problem and vaginal bleeding. Negative for dysuria, vaginal discharge and vaginal pain.  Musculoskeletal: Negative.   Neurological:       Burning feet  All other systems reviewed and are negative.      Objective:   Physical Exam  Constitutional: She is oriented to person, place, and time. She appears well-developed. No distress.  Cardiovascular: Normal rate, regular rhythm and normal heart sounds.   No murmur heard. Pulmonary/Chest: Effort normal and breath sounds normal. No respiratory distress. She has no wheezes.  Abdominal: Soft. Bowel sounds are normal. She exhibits no distension and no mass. There is no tenderness.  Genitourinary: Uterus normal. Cervix exhibits no motion tenderness and no friability. Right adnexum displays no mass. Left adnexum displays no mass. No tenderness in the vagina.    Musculoskeletal: Normal range of motion. She exhibits no edema.  Neurological: She is alert and oriented to person, place, and time. No cranial nerve deficit.  No sensory deficit  Psychiatric: She has a normal mood and affect.  Nursing note and vitals reviewed.      Assessment:     DM2 with neuropathy Hypertriglyceridemia Morbid obesity Vaginal bleeding     Plan:     Check problem list.

## 2017-05-30 NOTE — Assessment & Plan Note (Signed)
Diet and exercise discussed. Continue fish oil for now. Hold off on Statin since her LDL looks good.

## 2017-05-30 NOTE — Assessment & Plan Note (Signed)
CBG done today was 500+ We discussed insulin. However, we will hold off on this for now. Add Glipizide 10 mg qd to regimen. Continue home CBG check. Diet and exercise reinforced. F/U in 1 week for reassessment. I started her on Gabapentin for neuropathic pain. ACEi discussed. However, her BP is low normal, hence, we will hold off on this for now.

## 2017-05-30 NOTE — Assessment & Plan Note (Signed)
May be due to hormonal imbalance vs due to Depo shot. On exam looks like the bleeding is clearing. Will not give meds today. Monitor for few more days. I will reassess in 1 week. Return precaution discussed.

## 2017-05-30 NOTE — Patient Instructions (Signed)
It was nice seeing you today. Please continue Metformin  BID. Also start Glipizide 10 mg qd. I started you on Gabapentin for your neuropathic pain. Continue home glucose check. See me back in 1 week for reassessment. I will also check on your bleeding then.

## 2017-06-02 ENCOUNTER — Telehealth: Payer: Self-pay | Admitting: Family Medicine

## 2017-06-02 ENCOUNTER — Encounter: Payer: Self-pay | Admitting: Family Medicine

## 2017-06-02 NOTE — Telephone Encounter (Signed)
Patient came to pick up a results letter, about diabetes but it was no up here.  Patient needs it today if possible.  [phone]  w/questions

## 2017-06-02 NOTE — Telephone Encounter (Signed)
Patient informed about letter. [name] [name],CMA

## 2017-06-02 NOTE — Progress Notes (Signed)
Patient requested a letter of medical support for medicaid application.

## 2017-06-02 NOTE — Telephone Encounter (Signed)
Please call to advise patient that her letter is ready for pick up up front. Thanks.

## 2017-06-02 NOTE — Telephone Encounter (Signed)
Will forward to MD.  There is a letter from 05-26-17 but there is no mention of diabetes in this letter. Lisa Marshall,CMA

## 2017-06-06 ENCOUNTER — Ambulatory Visit: Payer: Self-pay | Admitting: Family Medicine

## 2017-07-08 ENCOUNTER — Ambulatory Visit: Payer: Medicaid Other | Admitting: Family Medicine

## 2017-07-30 ENCOUNTER — Encounter (HOSPITAL_COMMUNITY): Payer: Self-pay | Admitting: *Deleted

## 2017-07-30 ENCOUNTER — Other Ambulatory Visit: Payer: Self-pay

## 2017-07-30 DIAGNOSIS — R222 Localized swelling, mass and lump, trunk: Secondary | ICD-10-CM | POA: Diagnosis present

## 2017-07-30 DIAGNOSIS — Z7984 Long term (current) use of oral hypoglycemic drugs: Secondary | ICD-10-CM | POA: Insufficient documentation

## 2017-07-30 DIAGNOSIS — J45909 Unspecified asthma, uncomplicated: Secondary | ICD-10-CM | POA: Insufficient documentation

## 2017-07-30 DIAGNOSIS — Z79899 Other long term (current) drug therapy: Secondary | ICD-10-CM | POA: Insufficient documentation

## 2017-07-30 DIAGNOSIS — E1140 Type 2 diabetes mellitus with diabetic neuropathy, unspecified: Secondary | ICD-10-CM | POA: Insufficient documentation

## 2017-07-30 DIAGNOSIS — N764 Abscess of vulva: Secondary | ICD-10-CM | POA: Diagnosis not present

## 2017-07-30 DIAGNOSIS — L02211 Cutaneous abscess of abdominal wall: Secondary | ICD-10-CM | POA: Insufficient documentation

## 2017-07-30 LAB — CBG MONITORING, ED: GLUCOSE-CAPILLARY: 512 mg/dL — AB (ref 65–99)

## 2017-07-30 NOTE — ED Triage Notes (Signed)
Pt c/o abscess to right lower abdomen and vagina for the past 3 days. Has been using heat compresses without improvement. Pt is a diabetic, on multiple diabetes mediations, CBGs has been 300-400

## 2017-07-31 ENCOUNTER — Emergency Department (HOSPITAL_COMMUNITY)
Admission: EM | Admit: 2017-07-31 | Discharge: 2017-07-31 | Disposition: A | Discharge status: Home / Self Care | Payer: Medicaid Other | Attending: Emergency Medicine | Admitting: Emergency Medicine

## 2017-07-31 DIAGNOSIS — L0291 Cutaneous abscess, unspecified: Secondary | ICD-10-CM

## 2017-07-31 MED ORDER — IBUPROFEN 600 MG PO TABS: 600.0000 mg | ORAL_TABLET | Freq: Four times a day (QID) | ORAL | 0 refills | Status: DC | PRN

## 2017-07-31 MED ORDER — OXYCODONE-ACETAMINOPHEN 5-325 MG PO TABS
1.0000 | ORAL_TABLET | Freq: Once | ORAL | Status: AC
  Administered 2017-07-31: 1 via ORAL
  Filled 2017-07-31: qty 1

## 2017-07-31 MED ORDER — OXYCODONE-ACETAMINOPHEN 5-325 MG PO TABS: 1.0000 | ORAL_TABLET | ORAL | 0 refills | Status: DC | PRN

## 2017-07-31 MED ORDER — LIDOCAINE-EPINEPHRINE (PF) 2 %-1:200000 IJ SOLN
10.0000 mL | Freq: Once | INTRAMUSCULAR | Status: AC
  Administered 2017-07-31: 10 mL
  Filled 2017-07-31: qty 20

## 2017-07-31 MED ORDER — SULFAMETHOXAZOLE-TRIMETHOPRIM 800-160 MG PO TABS: 1.0000 | ORAL_TABLET | Freq: Two times a day (BID) | ORAL | 0 refills | Status: AC

## 2017-07-31 NOTE — ED Provider Notes (Signed)
[facility]   CSN: [id number] Arrival date & time: 07/30/17  2207     History   Chief Complaint Chief Complaint  Patient presents with  . Abscess    HPI Lisa Marshall is a 21 y.o. female.  Patient with history of recent diagnosis of DM presents with painful swollen areas to right lower abdomen and right outer labia for the last one week, c/w stated history of skin abscesses. No drainage or fever. No nausea or vomiting. The pain became significant worse today, prompting urgent evaluation.    The history is provided by the patient. No language interpreter was used.  Abscess  Associated symptoms: no fever     Past Medical History:  Diagnosis Date  . Asthma   . Depression   . Encounter for surveillance of injectable contraceptive 12/23/2014  . History of PID   . Pregnancy, multiple   . Syncope     Patient Active Problem List   Diagnosis Date Noted  . Hypertriglyceridemia 05/30/2017  . Vaginal bleeding 05/30/2017  . Poorly controlled type 2 diabetes mellitus with neuropathy (HCC) 05/23/2017  . Episodic mood disorder (HCC) 06/21/2013  . Morbid obesity (HCC) 11/06/2007  . GERD 11/06/2007  . ASTHMA, INTERMITTENT 10/23/2006    Past Surgical History:  Procedure Laterality Date  . ADENOIDECTOMY    . CESAREAN SECTION N/A 11/18/2013   Procedure: Primary Cesarean Section Delivery Baby "A" Girl @ 0447, Apgars 1/2/4, Baby "B" @ 0450, Apgars 8/9;  Surgeon: Kathreen CosierBernard A Marshall, MD;  Location: [facility];  Service: Obstetrics;  Laterality: N/A;  . TONSILLECTOMY      OB History    Gravida Para Term Preterm AB Living   1 1 1  0 0 2   SAB TAB Ectopic Multiple Live Births   0 0 0 1 2       Home Medications    Prior to Admission medications   Medication Sig Start Date End Date Taking? Authorizing Provider  albuterol (PROVENTIL HFA;VENTOLIN HFA) 108 (90 Base) MCG/ACT inhaler Inhale into the lungs every 6 (six) hours as  needed for wheezing or shortness of breath.    [provider]  fluticasone (CUTIVATE) 0.05 % cream Apply 1 application topically 2 (two) times daily as needed (irritation between thighs).    [provider]  gabapentin (NEURONTIN) 300 MG capsule Take 1 capsule (300 mg total) by mouth 3 (three) times daily. 05/30/17   Doreene ElandEniola, Kehinde T, MD  glipiZIDE (GLUCOTROL) 10 MG tablet Take 1 tablet (10 mg total) by mouth daily. 05/30/17   Doreene ElandEniola, Kehinde T, MD  MedroxyPROGESTERone Acetate (DEPO-PROVERA IM) Inject into the muscle every 3 (three) months. Last injection 1st part of August 2018    [provider]  metFORMIN (GLUCOPHAGE) 500 MG tablet Take 2 tablets (1,000 mg total) by mouth 2 (two) times daily with a meal. 05/23/17 06/22/17  Doreene ElandEniola, Kehinde T, MD    Family History Family History  Problem Relation Age of Onset  . Asthma Other   . Diabetes Other   . Cancer Other   . Sickle cell anemia Mother     Social History Social History   Tobacco Use  . Smoking status: Never Smoker  . Smokeless tobacco: Never Used  Substance Use Topics  . Alcohol use: No  . Drug use: No     Allergies   Penicillins   Review of Systems Review of Systems  Constitutional: Negative for fever.  Gastrointestinal: Positive for abdominal  pain (See HPI.).  Genitourinary: Positive for vaginal pain (See HPI.).  Musculoskeletal: Negative for myalgias.  Skin: Positive for wound.     Physical Exam Updated Vital Signs BP 135/88 (BP Location: Right Arm)   Pulse 89   Temp 98.2 F (36.8 C) (Oral)   Resp 16   LMP 07/21/2017   SpO2 100%   Physical Exam  Constitutional: She is oriented to person, place, and time. She appears well-developed and well-nourished.  Neck: Normal range of motion.  Pulmonary/Chest: Effort normal.  Abdominal: Soft. There is tenderness (Tenderness limited to area of skin swelling and induration - See Skin exam). There is no rebound.  Genitourinary:    Genitourinary Comments: Right labia majora indurated along posterior area. No area of fluctuance, ulceration, pustule or drainage. No extension into the mucosal region.  Neurological: She is alert and oriented to person, place, and time.  Skin: Skin is warm and dry.  Large area of induration measuing 8 x 8 cm with central area of fluctuance. No active drainage. No surrounding redness to indicate infection.      ED Treatments / Results  Labs (all labs ordered are listed, but only abnormal results are displayed) Labs Reviewed  CBG MONITORING, ED - Abnormal; Notable for the following components:      Result Value   Glucose-Capillary 512 (*)    All other components within normal limits    EKG  EKG Interpretation None       Radiology No results found.  Procedures .Marland KitchenIncision and Drainage Date/Time: 08/03/2017 6:04 AM Performed by: Elpidio Anis, [name] Authorized by: Elpidio Anis, [name]   Consent:    Consent obtained:  Verbal   Consent given by:  Patient Location:    Type:  Abscess   Location:  Trunk   Trunk location:  Abdomen Pre-procedure details:    Skin preparation:  Betadine Anesthesia (see MAR for exact dosages):    Anesthesia method:  Local infiltration   Local anesthetic:  Lidocaine 2% WITH epi Procedure type:    Complexity:  Complex Procedure details:    Incision depth:  Subcutaneous   Scalpel blade:  11   Wound management:  Probed and deloculated, irrigated with saline and extensive cleaning   Drainage:  Bloody and purulent   Drainage amount:  Moderate   Packing materials:  1/4 in gauze Post-procedure details:    Patient tolerance of procedure:  Tolerated well, no immediate complications Comments:     Multiple blood clots removed from abscess cavity  .Marland KitchenIncision and Drainage Date/Time: 08/03/2017 6:09 AM Performed by: Elpidio Anis, [name] Authorized by: Elpidio Anis, [name]   Consent:    Consent obtained:  Verbal   Consent given by:   Patient Location:    Type:  Abscess   Location:  Anogenital   Anogenital location: External labia, right. Pre-procedure details:    Skin preparation:  Betadine Anesthesia (see MAR for exact dosages):    Anesthesia method:  Local infiltration   Local anesthetic:  Lidocaine 2% WITH epi Procedure type:    Complexity:  Simple Procedure details:    Incision types:  Single straight   Incision depth:  Subcutaneous   Scalpel blade:  11   Wound management:  Probed and deloculated   Drainage:  Bloody   Drainage amount:  Scant   Packing materials:  None Post-procedure details:    Patient tolerance of procedure:  Tolerated well, no immediate complications     (including critical care time)  Medications Ordered in ED Medications -  No data to display   Initial Impression / Assessment and Plan / ED Course  I have reviewed the triage vital signs and the nursing notes.  Pertinent labs & imaging results that were available during my care of the patient were reviewed by me and considered in my medical decision making (see chart for details).     Patient presents with uncomplicated abscesses x 2, abdominal wall and outer vaginal  Labia, I&D'd as per above note.   Final Clinical Impressions(s) / ED Diagnoses   Final diagnoses:  None   1. Cutaneous abscess x 2  ED Discharge Orders    None       Elpidio AnisUpstill, Venise [place]ing[place], [name] 08/03/17 [id number]    Arby BarrettePfeiffer, Marcy, MD 08/11/17 [phone]

## 2017-08-11 ENCOUNTER — Telehealth: Payer: Self-pay | Admitting: Family Medicine

## 2017-08-11 NOTE — Telephone Encounter (Signed)
Clinical info completed on accomodations form.  Place form in Dr. Phebe CollaEniola's box for completion.  Feliz BeamHARTSELL,  Ritamarie Arkin, CMA

## 2017-08-11 NOTE — Telephone Encounter (Signed)
Pt left a ADA employee accommodation request form to be fill up and sign by MD. Form in [employer]. [phone]

## 2017-08-12 NOTE — Telephone Encounter (Signed)
Form complete and placed in RN box

## 2017-09-02 NOTE — Telephone Encounter (Signed)
Received message on nurse line from Eldridge AbrahamsKimberly Vincent regarding [employer] Acc[employer]modations. States that part of the form was not completed and needs more information before this can be processed. Please call Cala BradfordKimberly at [phone].  Kinnie FeilL. Ducatte, RN, BSN

## 2017-09-03 NOTE — Telephone Encounter (Signed)
I returned Lisa Marshall's call. She stated on the ADA accomodation form, everything was filled appropriately and the accomodation was completed for temporary period of time. She will like to know the duration. I advised her to put down 6 months for now till we are able to reassess Lisa Marshall's DM control. She verbalized understanding.

## 2017-09-05 ENCOUNTER — Emergency Department (HOSPITAL_COMMUNITY)
Admission: EM | Admit: 2017-09-05 | Discharge: 2017-09-06 | Disposition: A | Discharge status: Home / Self Care | Payer: Medicaid Other | Attending: Emergency Medicine | Admitting: Emergency Medicine

## 2017-09-05 ENCOUNTER — Encounter (HOSPITAL_COMMUNITY): Payer: Self-pay | Admitting: Emergency Medicine

## 2017-09-05 DIAGNOSIS — E1165 Type 2 diabetes mellitus with hyperglycemia: Secondary | ICD-10-CM | POA: Insufficient documentation

## 2017-09-05 DIAGNOSIS — Z79899 Other long term (current) drug therapy: Secondary | ICD-10-CM | POA: Diagnosis not present

## 2017-09-05 DIAGNOSIS — E1140 Type 2 diabetes mellitus with diabetic neuropathy, unspecified: Secondary | ICD-10-CM | POA: Diagnosis not present

## 2017-09-05 DIAGNOSIS — R739 Hyperglycemia, unspecified: Secondary | ICD-10-CM

## 2017-09-05 DIAGNOSIS — J45909 Unspecified asthma, uncomplicated: Secondary | ICD-10-CM | POA: Diagnosis not present

## 2017-09-05 LAB — URINALYSIS, ROUTINE W REFLEX MICROSCOPIC
Bacteria, UA: NONE SEEN
Bilirubin Urine: NEGATIVE
Hgb urine dipstick: NEGATIVE
Ketones, ur: 80 mg/dL — AB
Leukocytes, UA: NEGATIVE
Nitrite: NEGATIVE
PH: 7 (ref 5.0–8.0)
Protein, ur: NEGATIVE mg/dL
RBC / HPF: NONE SEEN RBC/hpf (ref 0–5)
SPECIFIC GRAVITY, URINE: 1.033 — AB (ref 1.005–1.030)
WBC, UA: NONE SEEN WBC/hpf (ref 0–5)

## 2017-09-05 LAB — BASIC METABOLIC PANEL
ANION GAP: 13 (ref 5–15)
CALCIUM: 8.7 mg/dL — AB (ref 8.9–10.3)
CO2: 17 mmol/L — AB (ref 22–32)
CREATININE: 0.57 mg/dL (ref 0.44–1.00)
Chloride: 101 mmol/L (ref 101–111)
GFR calc Af Amer: >60 mL/min (ref >60)
GFR calc non Af Amer: >60 mL/min (ref >60)
GLUCOSE: 488 mg/dL — AB (ref 65–99)
Potassium: 3.5 mmol/L (ref 3.5–5.1)
Sodium: 131 mmol/L — ABNORMAL LOW (ref 135–145)

## 2017-09-05 LAB — CBC
HCT: 36.4 % (ref 36.0–46.0)
HEMOGLOBIN: 12.5 g/dL (ref 12.0–15.0)
MCH: 30.7 pg (ref 26.0–34.0)
MCHC: 34.3 g/dL (ref 30.0–36.0)
MCV: 89.4 fL (ref 78.0–100.0)
PLATELETS: 288 10*3/uL (ref 150–400)
RBC: 4.07 MIL/uL (ref 3.87–5.11)
RDW: 13.3 % (ref 11.5–15.5)
WBC: 4.2 10*3/uL (ref 4.0–10.5)

## 2017-09-05 LAB — CBG MONITORING, ED
GLUCOSE-CAPILLARY: 559 mg/dL — AB (ref 65–99)
GLUCOSE-CAPILLARY: 436 mg/dL — AB (ref 65–99)
Glucose-Capillary: 485 mg/dL — ABNORMAL HIGH (ref 65–99)
Glucose-Capillary: 494 mg/dL — ABNORMAL HIGH (ref 65–99)
Glucose-Capillary: 401 mg/dL — ABNORMAL HIGH (ref 65–99)
Glucose-Capillary: 287 mg/dL — ABNORMAL HIGH (ref 65–99)

## 2017-09-05 LAB — I-STAT BETA HCG BLOOD, ED (MC, WL, AP ONLY)

## 2017-09-05 MED ORDER — PROMETHAZINE HCL 25 MG PO TABS: 25.0000 mg | ORAL_TABLET | Freq: Four times a day (QID) | ORAL | 0 refills | Status: DC | PRN

## 2017-09-05 MED ORDER — ONDANSETRON HCL 4 MG/2ML IJ SOLN
4.0000 mg | Freq: Once | INTRAMUSCULAR | Status: AC
  Administered 2017-09-05: 4 mg via INTRAVENOUS
  Filled 2017-09-05: qty 2

## 2017-09-05 MED ORDER — INSULIN ASPART 100 UNIT/ML ~~LOC~~ SOLN
10.0000 [IU] | Freq: Once | SUBCUTANEOUS | Status: AC
  Administered 2017-09-05: 10 [IU] via SUBCUTANEOUS
  Filled 2017-09-05: qty 1

## 2017-09-05 MED ORDER — SODIUM CHLORIDE 0.9 % IV BOLUS (SEPSIS)
2000.0000 mL | Freq: Once | INTRAVENOUS | Status: AC
  Administered 2017-09-05: 2000 mL via INTRAVENOUS

## 2017-09-05 NOTE — Discharge Instructions (Signed)
Please follow up closely with your doctor for further management of your blood sugar.  You may need to start insulin if appropriate.  Return if you have any concerns.

## 2017-09-05 NOTE — ED Triage Notes (Signed)
Patient reports been having cough, congestion, v/d for over week and taking antibiotics causing her blood sugars to be running high. Reports earlier today was 580.

## 2017-09-05 NOTE — ED Provider Notes (Signed)
[facility]-EMERGENCY DEPT Provider Note   CSN: [id number] Arrival date & time: 09/05/17  1430     History   Chief Complaint Chief Complaint  Patient presents with  . Hyperglycemia  . Emesis  . Diarrhea    HPI Lisa Marshall is a 22 y.o. female.  HPI   22 year old female with history of poorly controlled diabetes, morbid obesity, GERD, asthma presenting complaining of elevated blood sugar.  Patient report last week she was having some cold symptoms.  She was seen and evaluated and subsequently was given a course of steroid to take for the next 5 days.  Since being on the steroid, she noticed that her blood sugar has been elevated usually in the 5-600 range.  She recently finished with a steroid but her blood sugar still is elevated.  She checks her blood sugar 3 times daily.  Furthermore, she endorsed generalized weakness, feeling nauseous, vomiting after eating.  Her cold symptoms has resolved.  No report of fever or chills, no shortness of breath or chest pain or dysuria.  She is a non-insulin-dependent diabetes.  She has been taking her diabetic medication as prescribed.  Past Medical History:  Diagnosis Date  . Asthma   . Depression   . Encounter for surveillance of injectable contraceptive 12/23/2014  . History of PID   . Pregnancy, multiple   . Syncope     Patient Active Problem List   Diagnosis Date Noted  . Hypertriglyceridemia 05/30/2017  . Vaginal bleeding 05/30/2017  . Poorly controlled type 2 diabetes mellitus with neuropathy (HCC) 05/23/2017  . Episodic mood disorder (HCC) 06/21/2013  . Morbid obesity (HCC) 11/06/2007  . GERD 11/06/2007  . ASTHMA, INTERMITTENT 10/23/2006    Past Surgical History:  Procedure Laterality Date  . ADENOIDECTOMY    . CESAREAN SECTION N/A 11/18/2013   Procedure: Primary Cesarean Section Delivery Baby "A" Girl @ 0447, Apgars 1/2/4, Baby "B" @ 0450, Apgars 8/9;  Surgeon: Kathreen CosierBernard A Marshall, MD;  Location: [facility];  Service: Obstetrics;  Laterality: N/A;  . TONSILLECTOMY      OB History    Gravida Para Term Preterm AB Living   1 1 1  0 0 2   SAB TAB Ectopic Multiple Live Births   0 0 0 1 2       Home Medications    Prior to Admission medications   Medication Sig Start Date End Date Taking? Authorizing Provider  albuterol (PROVENTIL HFA;VENTOLIN HFA) 108 (90 Base) MCG/ACT inhaler Inhale into the lungs every 6 (six) hours as needed for wheezing or shortness of breath.    [provider]  fluticasone (CUTIVATE) 0.05 % cream Apply 1 application topically 2 (two) times daily as needed (irritation between thighs).    [provider]  gabapentin (NEURONTIN) 300 MG capsule Take 1 capsule (300 mg total) by mouth 3 (three) times daily. 05/30/17   Doreene ElandEniola, Kehinde T, MD  glipiZIDE (GLUCOTROL) 10 MG tablet Take 1 tablet (10 mg total) by mouth daily. 05/30/17   Doreene ElandEniola, Kehinde T, MD  ibuprofen (ADVIL,MOTRIN) 600 MG tablet Take 1 tablet (600 mg total) by mouth every 6 (six) hours as needed. 07/31/17   Elpidio AnisUpstill, Shari, [name]  MedroxyPROGESTERone Acetate (DEPO-PROVERA IM) Inject into the muscle every 3 (three) months. Last injection 1st part of August 2018    [provider]  metFORMIN (GLUCOPHAGE) 500 MG tablet Take 2 tablets (1,000 mg total) by mouth 2 (two) times daily with a meal. 05/23/17 06/22/17  Doreene Eland, MD  oxyCODONE-acetaminophen (PERCOCET/ROXICET) 5-325 MG tablet Take 1 tablet by mouth every 4 (four) hours as needed for moderate pain or severe pain. 07/31/17   Elpidio Anis, [name]    Family History Family History  Problem Relation Age of Onset  . Asthma Other   . Diabetes Other   . Cancer Other   . Sickle cell anemia Mother     Social History Social History   Tobacco Use  . Smoking status: Never Smoker  . Smokeless tobacco: Never Used  Substance Use Topics  . Alcohol use: No  . Drug use: No     Allergies   Penicillins   Review of Systems Review of  Systems  All other systems reviewed and are negative.    Physical Exam Updated Vital Signs BP 121/75 (BP Location: Right Arm)   Pulse 84   Temp 98.1 F (36.7 C) (Oral)   Resp 20   Ht 5\' 3"  (1.6 m)   LMP 08/31/2017   SpO2 100%   BMI 50.31 kg/m   Physical Exam  Constitutional: She is oriented to person, place, and time. She appears well-developed and well-nourished. No distress.  HENT:  Head: Atraumatic.  Right Ear: External ear normal.  Left Ear: External ear normal.  Nose: Nose normal.  Mouth/Throat: Oropharynx is clear and moist.  Eyes: Conjunctivae are normal.  Neck: Normal range of motion. Neck supple.  Cardiovascular: Normal rate and regular rhythm.  Pulmonary/Chest: Effort normal and breath sounds normal.  Abdominal: Bowel sounds are normal. She exhibits no distension. There is no tenderness.  Neurological: She is alert and oriented to person, place, and time.  Skin: No rash noted.  Psychiatric: She has a normal mood and affect.  Nursing note and vitals reviewed.    ED Treatments / Results  Labs (all labs ordered are listed, but only abnormal results are displayed) Labs Reviewed  BASIC METABOLIC PANEL - Abnormal; Notable for the following components:      Result Value   Sodium 131 (*)    CO2 17 (*)    Glucose, Bld 488 (*)    BUN <5 (*)    Calcium 8.7 (*)    All other components within normal limits  URINALYSIS, ROUTINE W REFLEX MICROSCOPIC - Abnormal; Notable for the following components:   Color, Urine COLORLESS (*)    Specific Gravity, Urine 1.033 (*)    Glucose, UA >=500 (*)    Ketones, ur 80 (*)    Squamous Epithelial / LPF 0-5 (*)    All other components within normal limits  CBG MONITORING, ED - Abnormal; Notable for the following components:   Glucose-Capillary 485 (*)    All other components within normal limits  CBG MONITORING, ED - Abnormal; Notable for the following components:   Glucose-Capillary 559 (*)    All other components within  normal limits  CBG MONITORING, ED - Abnormal; Notable for the following components:   Glucose-Capillary 494 (*)    All other components within normal limits  CBG MONITORING, ED - Abnormal; Notable for the following components:   Glucose-Capillary 436 (*)    All other components within normal limits  CBG MONITORING, ED - Abnormal; Notable for the following components:   Glucose-Capillary 401 (*)    All other components within normal limits  CBG MONITORING, ED - Abnormal; Notable for the following components:   Glucose-Capillary 287 (*)    All other components within normal limits  CBC  I-STAT BETA HCG BLOOD, ED (  [facility], [facility], [facility] ONLY)    EKG  EKG Interpretation None       Radiology No results found.  Procedures Procedures (including critical care time)  Medications Ordered in ED Medications  sodium chloride 0.9 % bolus 2,000 mL (0 mLs Intravenous Stopped 09/05/17 2258)  insulin aspart (novoLOG) injection 10 Units (10 Units Subcutaneous Given 09/05/17 2042)  ondansetron (ZOFRAN) injection 4 mg (4 mg Intravenous Given 09/05/17 2048)  insulin aspart (novoLOG) injection 10 Units (10 Units Subcutaneous Given 09/05/17 2209)     Initial Impression / Assessment and Plan / ED Course  I have reviewed the triage vital signs and the nursing notes.  Pertinent labs & imaging results that were available during my care of the patient were reviewed by me and considered in my medical decision making (see chart for details).     BP 122/79   Pulse 94   Temp 98.1 F (36.7 C) (Oral)   Resp 16   Ht 5\' 3"  (1.6 m)   LMP 08/31/2017   SpO2 100%   BMI 50.31 kg/m    Final Clinical Impressions(s) / ED Diagnoses   Final diagnoses:  Hyperglycemia    ED Discharge Orders        Ordered    promethazine (PHENERGAN) 25 MG tablet  Every 6 hours PRN     09/05/17 2357     8:25 PM Patient here due to persistent hyperglycemia while being on a steroid taper pack.  Recent sickness which has since  resolved.  Also endorsed nausea vomiting without any abdominal pain.  She is well-appearing.  Her CBG is 559 with 80 ketones in urine but normal anion gap.  UA without signs of urinary tract infection, pregnancy test is negative, her white count is normal.  We will give IV fluid, and insulin to help reduce her blood sugar.  11:25 PM Patient has been receiving IV fluids as well as insulin in the ED.  Her blood sugars steadily improved from 559 to 287.  Patient felt better.  She is well-appearing.  She will follow-up closely with her primary care provider for further management of her diabetes.  I encouraged healthy diet, exercise, and monitor her blood sugar daily.  Return precautions discussed.   Fayrene Helper, [name] 09/05/17 2358    Derwood Kaplan, MD 09/06/17 [phone]

## 2017-09-23 ENCOUNTER — Ambulatory Visit: Payer: Medicaid Other | Admitting: Family Medicine

## 2017-10-03 ENCOUNTER — Other Ambulatory Visit: Payer: Self-pay

## 2017-10-03 ENCOUNTER — Encounter: Payer: Self-pay | Admitting: Family Medicine

## 2017-10-03 ENCOUNTER — Ambulatory Visit: Payer: Medicaid Other | Admitting: Family Medicine

## 2017-10-03 VITALS — BP 118/64 | HR 100 | Temp 97.7°F | Ht 63.0 in | Wt 236.0 lb

## 2017-10-03 DIAGNOSIS — E781 Pure hyperglyceridemia: Secondary | ICD-10-CM | POA: Diagnosis not present

## 2017-10-03 DIAGNOSIS — E1165 Type 2 diabetes mellitus with hyperglycemia: Secondary | ICD-10-CM | POA: Diagnosis not present

## 2017-10-03 DIAGNOSIS — L03032 Cellulitis of left toe: Secondary | ICD-10-CM | POA: Diagnosis not present

## 2017-10-03 DIAGNOSIS — E1140 Type 2 diabetes mellitus with diabetic neuropathy, unspecified: Secondary | ICD-10-CM | POA: Diagnosis present

## 2017-10-03 HISTORY — DX: Cellulitis of left toe: L03.032

## 2017-10-03 LAB — POCT CBG (FASTING - GLUCOSE)-MANUAL ENTRY: Glucose Fasting, POC: 407 mg/dL — AB (ref 70–99)

## 2017-10-03 LAB — POCT GLYCOSYLATED HEMOGLOBIN (HGB A1C): Hemoglobin A1C: >15

## 2017-10-03 MED ORDER — INSULIN GLARGINE 100 UNIT/ML SOLOSTAR PEN: 10.0000 [IU] | PEN_INJECTOR | Freq: Every day | SUBCUTANEOUS | 0 refills | Status: DC

## 2017-10-03 MED ORDER — CLINDAMYCIN HCL 300 MG PO CAPS: 300.0000 mg | ORAL_CAPSULE | Freq: Four times a day (QID) | ORAL | 0 refills | Status: AC

## 2017-10-03 MED ORDER — INSULIN GLARGINE 100 UNIT/ML SOLOSTAR PEN: 10.0000 [IU] | PEN_INJECTOR | Freq: Every day | SUBCUTANEOUS | 2 refills | Status: DC

## 2017-10-03 NOTE — Progress Notes (Signed)
Subjective:     Patient ID: Lisa Marshall, female   DOB: May 23, 1996, 23 y.o.   MRN: [id number]  HPI DM2/HLD:Here for follow-up. She is compliant with Metformin 1000 mg BID and Glipizide 10 mg qd. She stated her home CBG has never been less than 300 despite diet and exercise control in addition to her medication. She is here to discuss initiating insulin. She takes one fish oil daily. Toe pain:C/O left big toe pain and swelling which started 1 week ago, she did pick on it and pus came out of it. Swelling is yellowish in color. Denies injury to her toe otherwise.  Current Outpatient Medications on File Prior to Visit  Medication Sig Dispense Refill  . albuterol (PROVENTIL HFA;VENTOLIN HFA) 108 (90 Base) MCG/ACT inhaler Inhale into the lungs every 6 (six) hours as needed for wheezing or shortness of breath.    . gabapentin (NEURONTIN) 300 MG capsule Take 1 capsule (300 mg total) by mouth 3 (three) times daily. 90 capsule 3  . glipiZIDE (GLUCOTROL) 10 MG tablet Take 1 tablet (10 mg total) by mouth daily. 30 tablet 3  . ibuprofen (ADVIL,MOTRIN) 600 MG tablet Take 1 tablet (600 mg total) by mouth every 6 (six) hours as needed. 30 tablet 0  . MedroxyPROGESTERone Acetate (DEPO-PROVERA IM) Inject into the muscle every 3 (three) months. Last injection 1st part of August 2018    . metFORMIN (GLUCOPHAGE) 500 MG tablet Take 2 tablets (1,000 mg total) by mouth 2 (two) times daily with a meal. 120 tablet 3  . oxyCODONE-acetaminophen (PERCOCET/ROXICET) 5-325 MG tablet Take 1 tablet by mouth every 4 (four) hours as needed for moderate pain or severe pain. 6 tablet 0  . promethazine (PHENERGAN) 25 MG tablet Take 1 tablet (25 mg total) by mouth every 6 (six) hours as needed for nausea or vomiting. 20 tablet 0   No current facility-administered medications on file prior to visit.    Past Medical History:  Diagnosis Date  . Asthma   . Depression   . Encounter for surveillance of injectable contraceptive  12/23/2014  . History of PID   . Pregnancy, multiple   . Syncope    Vitals:   10/03/17 0928  BP: 118/64  Pulse: 100  Temp: 97.7 F (36.5 C)  TempSrc: Oral  SpO2: 98%  Weight: 236 lb (107 kg)  Height: 5\' 3"  (1.6 m)     Review of Systems  Respiratory: Negative.   Cardiovascular: Negative.   Gastrointestinal: Negative.   Genitourinary: Negative.   Neurological: Negative.   All other systems reviewed and are negative.      Objective:   Physical Exam  Constitutional: She is oriented to person, place, and time. She appears well-developed. No distress.  Cardiovascular: Normal rate and regular rhythm.  No murmur heard. Pulmonary/Chest: Effort normal and breath sounds normal. No respiratory distress. She has no wheezes.  Abdominal: Soft. Bowel sounds are normal. She exhibits no distension and no mass. There is no tenderness.  Musculoskeletal: Normal range of motion. She exhibits no edema.       Feet:  Neurological: She is alert and oriented to person, place, and time.  Nursing note and vitals reviewed.      Assessment:     DM2: Poorly controlled HLD: Hypertriglyceridemia   Paronychia Plan:     Check problem list

## 2017-10-03 NOTE — Assessment & Plan Note (Signed)
May take up to 2 tablet BID. For now she will try 1 tablet BID and increase gradually. Conitnue to work on diet and exercise.

## 2017-10-03 NOTE — Assessment & Plan Note (Addendum)
Discussed foot care and exam in a diabetic. Advised her to avoid picking her nails. Ingrown nail may be contributing to this. I will treat infection first. She will benefit from podiatry foot exam as well. Referral placed. Clindamycin started.Penicillin allergy hence Keflex not prescribed..Marland Kitchen

## 2017-10-03 NOTE — Patient Instructions (Signed)
Insulin Glargine injection What is this medicine? INSULIN GLARGINE (IN su lin GLAR geen) is a human-made form of insulin. This drug lowers the amount of sugar in your blood. It is a long-acting insulin that is usually given once a day. This medicine may be used for other purposes; ask your health care provider or pharmacist if you have questions. COMMON BRAND NAME(S): BASAGLAR, Lantus, Lantus SoloStar, Toujeo SoloStar What should I tell my health care provider before I take this medicine? They need to know if you have any of these conditions: -episodes of low blood sugar -kidney disease -liver disease -an unusual or allergic reaction to insulin, metacresol, other medicines, foods, dyes, or preservatives -pregnant or trying to get pregnant -breast-feeding How should I use this medicine? This medicine is for injection under the skin. Use this medicine at the same time each day. Use exactly as directed. This insulin should never be mixed in the same syringe with other insulins before injection. Do not vigorously shake before use. You will be taught how to use this medicine and how to adjust doses for activities and illness. Do not use more insulin than prescribed. Always check the appearance of your insulin before using it. This medicine should be clear and colorless like water. Do not use it if it is cloudy, thickened, colored, or has solid particles in it. It is important that you put your used needles and syringes in a special sharps container. Do not put them in a trash can. If you do not have a sharps container, call your pharmacist or healthcare provider to get one. Talk to your pediatrician regarding the use of this medicine in children. Special care may be needed. Overdosage: If you think you have taken too much of this medicine contact a poison control center or emergency room at once. NOTE: This medicine is only for you. Do not share this medicine with others. What if I miss a dose? It is  important not to miss a dose. Your health care professional or doctor should discuss a plan for missed doses with you. If you do miss a dose, follow their plan. Do not take double doses. What may interact with this medicine? -other medicines for diabetes Many medications may cause changes in blood sugar, these include: -alcohol containing beverages -antiviral medicines for HIV or AIDS -aspirin and aspirin-like drugs -certain medicines for blood pressure, heart disease, irregular heart beat -chromium -diuretics -female hormones, such as estrogens or progestins, birth control pills -fenofibrate -gemfibrozil -isoniazid -lanreotide -female hormones or anabolic steroids -MAOIs like Carbex, Eldepryl, Marplan, Nardil, and Parnate -medicines for weight loss -medicines for allergies, asthma, cold, or cough -medicines for depression, anxiety, or psychotic disturbances -niacin -nicotine -NSAIDs, medicines for pain and inflammation, like ibuprofen or naproxen -octreotide -pasireotide -pentamidine -phenytoin -probenecid -quinolone antibiotics such as ciprofloxacin, levofloxacin, ofloxacin -some herbal dietary supplements -steroid medicines such as prednisone or cortisone -sulfamethoxazole; trimethoprim -thyroid hormones Some medications can hide the warning symptoms of low blood sugar (hypoglycemia). You may need to monitor your blood sugar more closely if you are taking one of these medications. These include: -beta-blockers, often used for high blood pressure or heart problems (examples include atenolol, metoprolol, propranolol) -clonidine -guanethidine -reserpine This list may not describe all possible interactions. Give your health care provider a list of all the medicines, herbs, non-prescription drugs, or dietary supplements you use. Also tell them if you smoke, drink alcohol, or use illegal drugs. Some items may interact with your medicine. What should I watch for   while using this  medicine? Visit your health care professional or doctor for regular checks on your progress. Do not drive, use machinery, or do anything that needs mental alertness until you know how this medicine affects you. Alcohol may interfere with the effect of this medicine. Avoid alcoholic drinks. A test called the HbA1C (A1C) will be monitored. This is a simple blood test. It measures your blood sugar control over the last 2 to 3 months. You will receive this test every 3 to 6 months. Learn how to check your blood sugar. Learn the symptoms of low and high blood sugar and how to manage them. Always carry a quick-source of sugar with you in case you have symptoms of low blood sugar. Examples include hard sugar candy or glucose tablets. Make sure others know that you can choke if you eat or drink when you develop serious symptoms of low blood sugar, such as seizures or unconsciousness. They must get medical help at once. Tell your doctor or health care professional if you have high blood sugar. You might need to change the dose of your medicine. If you are sick or exercising more than usual, you might need to change the dose of your medicine. Do not skip meals. Ask your doctor or health care professional if you should avoid alcohol. Many nonprescription cough and cold products contain sugar or alcohol. These can affect blood sugar. Make sure that you have the right kind of syringe for the type of insulin you use. Try not to change the brand and type of insulin or syringe unless your health care professional or doctor tells you to. Switching insulin brand or type can cause dangerously high or low blood sugar. Always keep an extra supply of insulin, syringes, and needles on hand. Use a syringe one time only. Throw away syringe and needle in a closed container to prevent accidental needle sticks. Insulin pens and cartridges should never be shared. Even if the needle is changed, sharing may result in passing of viruses  like hepatitis or HIV. Wear a medical ID bracelet or chain, and carry a card that describes your disease and details of your medicine and dosage times. What side effects may I notice from receiving this medicine? Side effects that you should report to your doctor or health care professional as soon as possible: -allergic reactions like skin rash, itching or hives, swelling of the face, lips, or tongue -breathing problems -signs and symptoms of high blood sugar such as dizziness, dry mouth, dry skin, fruity breath, nausea, stomach pain, increased hunger or thirst, increased urination -signs and symptoms of low blood sugar such as feeling anxious, confusion, dizziness, increased hunger, unusually weak or tired, sweating, shakiness, cold, irritable, headache, blurred vision, fast heartbeat, loss of consciousness Side effects that usually do not require medical attention (report to your doctor or health care professional if they continue or are bothersome): -increase or decrease in fatty tissue under the skin due to overuse of a particular injection site -itching, burning, swelling, or rash at site where injected This list may not describe all possible side effects. Call your doctor for medical advice about side effects. You may report side effects to FDA at 1-800-FDA-1088. Where should I keep my medicine? Keep out of the reach of children. Store unopened vials in a refrigerator between 2 and 8 degrees C (36 and 46 degrees F). Do not freeze or use if the insulin has been frozen. Opened vials (vials currently in use) may be stored   in the refrigerator or at room temperature, at approximately 25 degrees C (77 degrees F) or cooler. Keeping your insulin at room temperature decreases the amount of pain during injection. Once opened, your insulin can be used for 28 days. After 28 days, the vial should be thrown away. Store Lantus Solostar Pens or Basaglar KwikPens in a refrigerator between 2 and 8 degrees C (36  and 46 degrees F) or at room temperature below 30 degrees C (86 degrees F). Do not freeze or use if the insulin has been frozen. Once opened, the pens should be kept at room temperature. Do not store in the refrigerator once opened. Once opened, the insulin can be used for 28 days. After 28 days, the Lantus Solostar Pen or Basaglar KwikPen should be thrown away. Store Toujeo Solostar Pens in a refrigerator between 2 and 8 degrees C (36 and 46 degrees F). Do not freeze or use if the insulin has been frozen. Once opened, the pens should be kept at room temperature below 30 degrees C (86 degrees F). Do not store in the refrigerator once opened. Once opened, the insulin can be used for 42 days. After 42 days, the Toujeo Solostar Pen should be thrown away. Protect from light and excessive heat. Throw away any unused medicine after the expiration date or after the specified time for room temperature storage has passed. NOTE: This sheet is a summary. It may not cover all possible information. If you have questions about this medicine, talk to your doctor, pharmacist, or health care provider.  2018 Elsevier/Gold Standard (2016-08-28 10:26:25)  

## 2017-10-03 NOTE — Assessment & Plan Note (Signed)
A1C> 15 Insulin initiation discussed. Start Lantus 10 units daily. Pharmacy came in to teach use of insulin pen. Two samples of Lantus were given. I also did refill to her pharmacy. CBG parameters discussed. F/U in 2 weeks with CBG log for reassessment. Hold insulin if CBG drops less than 60-70. She agreed with plan.

## 2017-10-15 ENCOUNTER — Ambulatory Visit: Payer: Medicaid Other | Admitting: Internal Medicine

## 2017-10-15 NOTE — Progress Notes (Deleted)
   [facility] Phone: [phone]   Date of Visit: 10/15/2017   HPI:  DM2:  -   ROS: See HPI.  PMFSH:  PMH: GERD DM2 Morbid Obesity  Hypertriglyceridemia  Asthma Episodic Mood DO   PHYSICAL EXAM: LMP 09/16/2017  Gen: *** HEENT: *** Heart: *** Lungs: *** Neuro: *** Ext: ***  ASSESSMENT/PLAN:  Health maintenance:  -***  No problem-specific Assessment & Plan notes found for this encounter.  FOLLOW UP: Follow up in *** for ***  Palma HolterKanishka G Charlsey Moragne, MD PGY 2 [facility]

## 2017-11-04 ENCOUNTER — Other Ambulatory Visit: Payer: Self-pay | Admitting: Family Medicine

## 2017-11-14 ENCOUNTER — Other Ambulatory Visit: Payer: Self-pay | Admitting: Family Medicine

## 2017-11-15 ENCOUNTER — Ambulatory Visit (INDEPENDENT_AMBULATORY_CARE_PROVIDER_SITE_OTHER): Payer: Managed Care, Other (non HMO)

## 2017-11-15 ENCOUNTER — Ambulatory Visit (HOSPITAL_COMMUNITY)
Admission: EM | Admit: 2017-11-15 | Discharge: 2017-11-15 | Disposition: A | Discharge status: Home / Self Care | Payer: Managed Care, Other (non HMO) | Attending: Family Medicine | Admitting: Family Medicine

## 2017-11-15 ENCOUNTER — Other Ambulatory Visit: Payer: Self-pay

## 2017-11-15 ENCOUNTER — Encounter (HOSPITAL_COMMUNITY): Payer: Self-pay | Admitting: Emergency Medicine

## 2017-11-15 DIAGNOSIS — M25531 Pain in right wrist: Secondary | ICD-10-CM

## 2017-11-15 DIAGNOSIS — M25511 Pain in right shoulder: Secondary | ICD-10-CM | POA: Diagnosis not present

## 2017-11-15 DIAGNOSIS — M25571 Pain in right ankle and joints of right foot: Secondary | ICD-10-CM

## 2017-11-15 DIAGNOSIS — R1030 Lower abdominal pain, unspecified: Secondary | ICD-10-CM | POA: Diagnosis not present

## 2017-11-15 MED ORDER — IBUPROFEN 800 MG PO TABS: 800.0000 mg | ORAL_TABLET | Freq: Three times a day (TID) | ORAL | 0 refills | Status: DC
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
## 2017-11-15 NOTE — Discharge Instructions (Signed)
Use anti-inflammatories for pain/swelling. You may take up to 800 mg Ibuprofen every 8 hours with food. You may supplement Ibuprofen with Tylenol [phone] mg every 8 hours.   Please apply ice multiple times a day to wrist and ankle for approximately 20-30 minutes  Please use wrist brace and ankle brace as needed for support for the first week, please use for comfort in the second week

## 2017-11-17 ENCOUNTER — Other Ambulatory Visit: Payer: Self-pay

## 2017-11-17 ENCOUNTER — Encounter: Payer: Self-pay | Admitting: Family Medicine

## 2017-11-17 ENCOUNTER — Ambulatory Visit (INDEPENDENT_AMBULATORY_CARE_PROVIDER_SITE_OTHER): Payer: Managed Care, Other (non HMO) | Admitting: Family Medicine

## 2017-11-17 VITALS — BP 110/60 | HR 99 | Temp 98.3°F | Ht 63.0 in | Wt 248.0 lb

## 2017-11-17 DIAGNOSIS — M25571 Pain in right ankle and joints of right foot: Secondary | ICD-10-CM

## 2017-11-17 NOTE — Patient Instructions (Signed)
It was great seeing you today! We have addressed the following issues today  1. Please make sure you take about 600-800 mg of ibuprofen every 8 hours ( 3 times a day). 2. I will prescribe 15 pills of tramadol to be used for breakthrough ( I.e severe pain) do not use more than two a day. This medication will not be refilled. 3. If symptoms do not improve in the next few days, please return for further evaluation.   If we did any lab work today, and the results require attention, either me or my nurse will get in touch with you. If everything is normal, you will get a letter in mail and a message via . If you don't hear from us in two weeks, please give us a call. Otherwise, we look forward to seeing you again at your next visit. If you have any questions or concerns before then, please call the clinic at [phone].  Please bring all your medications to every doctors visit  Sign up for My Chart to have easy access to your labs results, and communication with your Primary care physician. Please ask Front Desk for some assistance.   Please check-out at the front desk before leaving the clinic.    Take Care,   Dr. Sydnee Cabaliallo   RICE for Routine Care of Injuries Many injuries can be cared for using rest, ice, compression, and elevation (RICE therapy). Using RICE therapy can help to lessen pain and swelling. It can help your body to heal. Rest Reduce your normal activities and avoid using the injured part of your body. You can go back to your normal activities when you feel okay and your doctor says it is okay. Ice Do not put ice on your bare skin.  Put ice in a plastic bag.  Place a towel between your skin and the bag.  Leave the ice on for 20 minutes, 2-3 times a day.  Do this for as long as told by your doctor. Compression Compression means putting pressure on the injured area. This can be done with an elastic bandage. If an elastic bandage has been applied:  Remove and reapply the  bandage every 3-4 hours or as told by your doctor.  Make sure the bandage is not wrapped too tight. Wrap the bandage more loosely if part of your body beyond the bandage is blue, swollen, cold, painful, or loses feeling (numb).  See your doctor if the bandage seems to make your problems worse.  Elevation Elevation means keeping the injured area raised. Raise the injured area above your heart or the center of your chest if you can. When should I get help? You should get help if:  You keep having pain and swelling.  Your symptoms get worse.  Get help right away if: You should get help right away if:  You have sudden bad pain at or below the area of your injury.  You have redness or more swelling around your injury.  You have tingling or numbness at or below the injury that does not go away when you take off the bandage.  This information is not intended to replace advice given to you by your health care provider. Make sure you discuss any questions you have with your health care provider. Document Released: 01/29/2008 Document Revised: 07/09/2016 Document Reviewed: 07/20/2014 Elsevier Interactive Patient Education  2017 [employer]
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
## 2017-11-23 ENCOUNTER — Encounter (HOSPITAL_COMMUNITY): Payer: Self-pay | Admitting: Family Medicine

## 2017-11-23 ENCOUNTER — Ambulatory Visit (HOSPITAL_COMMUNITY)
Admission: EM | Admit: 2017-11-23 | Discharge: 2017-11-23 | Disposition: A | Discharge status: Home / Self Care | Payer: Managed Care, Other (non HMO) | Attending: Physician Assistant | Admitting: Physician Assistant

## 2017-11-23 DIAGNOSIS — M545 Low back pain, unspecified: Secondary | ICD-10-CM

## 2017-11-23 DIAGNOSIS — M5416 Radiculopathy, lumbar region: Secondary | ICD-10-CM

## 2017-11-23 MED ORDER — CYCLOBENZAPRINE HCL 10 MG PO TABS: 5.0000 mg | ORAL_TABLET | Freq: Three times a day (TID) | ORAL | 0 refills | Status: DC | PRN

## 2017-11-23 MED ORDER — KETOROLAC TROMETHAMINE 30 MG/ML IJ SOLN
30.0000 mg | Freq: Once | INTRAMUSCULAR | Status: AC
  Administered 2017-11-23: 30 mg via INTRAMUSCULAR

## 2017-11-23 MED ORDER — KETOROLAC TROMETHAMINE 30 MG/ML IJ SOLN
INTRAMUSCULAR | Status: AC
Start: 2017-11-23 — End: ?
  Filled 2017-11-23: qty 1

## 2017-11-23 MED ORDER — TRAMADOL HCL 50 MG PO TABS: 50.0000 mg | ORAL_TABLET | Freq: Four times a day (QID) | ORAL | 0 refills | Status: DC | PRN

## 2017-11-23 NOTE — Discharge Instructions (Addendum)
I hope you get some relief with the tramadol and the Flexeril.  Please continue taking the ibuprofen.
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
## 2017-11-27 NOTE — Progress Notes (Deleted)
   [facility] Phone: [phone]   Date of Visit: 11/28/2017   HPI:  ***  ROS: See HPI.  PMFSH:  PMH: DM2 GERD Obesity  Asthma  Episodic Mood DO Hypertriglyceridemia Vaginal Bleeding   PHYSICAL EXAM: There were no vitals taken for this visit. Gen: *** HEENT: *** Heart: *** Lungs: *** Neuro: *** Ext: ***  ASSESSMENT/PLAN:  Health maintenance:  -***  No problem-specific Assessment & Plan notes found for this encounter.  FOLLOW UP: Follow up in *** for ***  Palma HolterKanishka G Gunadasa, MD PGY 2 [facility]

## 2017-11-28 ENCOUNTER — Ambulatory Visit: Payer: Managed Care, Other (non HMO) | Admitting: Internal Medicine

## 2018-01-01 ENCOUNTER — Encounter (HOSPITAL_COMMUNITY): Payer: Self-pay | Admitting: Family Medicine

## 2018-01-01 ENCOUNTER — Ambulatory Visit (INDEPENDENT_AMBULATORY_CARE_PROVIDER_SITE_OTHER): Payer: Managed Care, Other (non HMO)

## 2018-01-01 ENCOUNTER — Ambulatory Visit (HOSPITAL_COMMUNITY)
Admission: EM | Admit: 2018-01-01 | Discharge: 2018-01-01 | Disposition: A | Discharge status: Home / Self Care | Payer: Managed Care, Other (non HMO) | Attending: Family Medicine | Admitting: Family Medicine

## 2018-01-01 DIAGNOSIS — M7918 Myalgia, other site: Secondary | ICD-10-CM

## 2018-01-01 DIAGNOSIS — S39012A Strain of muscle, fascia and tendon of lower back, initial encounter: Secondary | ICD-10-CM

## 2018-01-01 DIAGNOSIS — S96911A Strain of unspecified muscle and tendon at ankle and foot level, right foot, initial encounter: Secondary | ICD-10-CM

## 2018-01-01 MED ORDER — CYCLOBENZAPRINE HCL 5 MG PO TABS: 5.0000 mg | ORAL_TABLET | Freq: Three times a day (TID) | ORAL | 0 refills | Status: DC | PRN

## 2018-01-01 MED ORDER — IBUPROFEN 800 MG PO TABS: 800.0000 mg | ORAL_TABLET | Freq: Three times a day (TID) | ORAL | 0 refills | Status: DC
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
## 2018-01-01 NOTE — Discharge Instructions (Addendum)
Need to back to wearing ankle brace Follow up with you orthopedic physician or PCP X rays are normal Activity as tolerated Ibuprofen for pain Flexeril as needed muscle relaxer

## 2018-01-01 NOTE — ED Notes (Signed)
Went to get patient for x-rays, was not ready, stated that she needed to take children to the bathroom
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
## 2018-01-06 ENCOUNTER — Ambulatory Visit: Payer: Managed Care, Other (non HMO) | Admitting: Family Medicine

## 2018-03-03 ENCOUNTER — Telehealth: Payer: Self-pay | Admitting: Family Medicine

## 2018-03-03 ENCOUNTER — Encounter: Payer: Self-pay | Admitting: Family Medicine

## 2018-03-03 ENCOUNTER — Other Ambulatory Visit: Payer: Self-pay

## 2018-03-03 ENCOUNTER — Ambulatory Visit (INDEPENDENT_AMBULATORY_CARE_PROVIDER_SITE_OTHER): Payer: Managed Care, Other (non HMO) | Admitting: Family Medicine

## 2018-03-03 ENCOUNTER — Other Ambulatory Visit (HOSPITAL_COMMUNITY)
Admission: RE | Admit: 2018-03-03 | Discharge: 2018-03-03 | Disposition: A | Discharge status: Home / Self Care | Payer: Managed Care, Other (non HMO) | Source: Ambulatory Visit | Attending: Family Medicine | Admitting: Family Medicine

## 2018-03-03 VITALS — BP 112/74 | HR 100 | Temp 98.2°F | Ht 63.0 in | Wt 261.0 lb

## 2018-03-03 DIAGNOSIS — B372 Candidiasis of skin and nail: Secondary | ICD-10-CM

## 2018-03-03 DIAGNOSIS — B370 Candidal stomatitis: Secondary | ICD-10-CM | POA: Diagnosis not present

## 2018-03-03 DIAGNOSIS — E1165 Type 2 diabetes mellitus with hyperglycemia: Secondary | ICD-10-CM

## 2018-03-03 DIAGNOSIS — N760 Acute vaginitis: Secondary | ICD-10-CM | POA: Diagnosis not present

## 2018-03-03 DIAGNOSIS — N898 Other specified noninflammatory disorders of vagina: Secondary | ICD-10-CM | POA: Diagnosis not present

## 2018-03-03 DIAGNOSIS — E1140 Type 2 diabetes mellitus with diabetic neuropathy, unspecified: Secondary | ICD-10-CM | POA: Diagnosis not present

## 2018-03-03 DIAGNOSIS — K625 Hemorrhage of anus and rectum: Secondary | ICD-10-CM | POA: Diagnosis not present

## 2018-03-03 LAB — POCT GLYCOSYLATED HEMOGLOBIN (HGB A1C): HbA1c, POC (controlled diabetic range): 13.4 % — AB (ref 0.0–7.0)

## 2018-03-03 LAB — GLUCOSE, POCT (MANUAL RESULT ENTRY): POC GLUCOSE: 284 mg/dL — AB (ref 70–99)

## 2018-03-03 LAB — POCT WET PREP (WET MOUNT)
Clue Cells Wet Prep Whiff POC: POSITIVE
TRICHOMONAS WET PREP HPF POC: ABSENT

## 2018-03-03 LAB — HEMOCCULT GUIAC POC 1CARD (OFFICE): Fecal Occult Blood, POC: NEGATIVE

## 2018-03-03 MED ORDER — METRONIDAZOLE 500 MG PO TABS: 500.0000 mg | ORAL_TABLET | Freq: Two times a day (BID) | ORAL | 0 refills | Status: AC

## 2018-03-03 MED ORDER — FLUCONAZOLE 150 MG PO TABS: 150.0000 mg | ORAL_TABLET | Freq: Once | ORAL | 0 refills | Status: AC

## 2018-03-03 MED ORDER — NYSTATIN 100000 UNIT/ML MT SUSP: 5.0000 mL | Freq: Four times a day (QID) | OROMUCOSAL | 0 refills | Status: AC

## 2018-03-03 MED ORDER — GLIPIZIDE 10 MG PO TABS: 10.0000 mg | ORAL_TABLET | Freq: Every day | ORAL | 3 refills | Status: DC

## 2018-03-03 MED ORDER — INSULIN GLARGINE 100 UNIT/ML SOLOSTAR PEN
20.0000 [IU] | PEN_INJECTOR | Freq: Every day | SUBCUTANEOUS | 3 refills | Status: DC
Start: 2018-03-03 — End: 2018-07-07

## 2018-03-03 MED ORDER — NYSTATIN 100000 UNIT/GM EX POWD: CUTANEOUS | 0 refills | Status: DC

## 2018-03-03 MED ORDER — METFORMIN HCL 500 MG PO TABS: 1000.0000 mg | ORAL_TABLET | Freq: Two times a day (BID) | ORAL | 3 refills | Status: DC

## 2018-03-03 NOTE — Addendum Note (Signed)
Addended by: Janit PaganENIOLA, Aldin Drees T on: 03/03/2018 02:38 PM   Modules accepted: Orders

## 2018-03-03 NOTE — Assessment & Plan Note (Signed)
Poorly controlled. Poor meds adherence. I recommended continuation of Glipizide and Metformin at current dose. Take Lantus 20 units daily. Continue home CBG check. Return in 2 weeks with CBG log. Call if number is running too high or too low.  She agreed with the plan.

## 2018-03-03 NOTE — Assessment & Plan Note (Signed)
Nystatin prescribed

## 2018-03-03 NOTE — Assessment & Plan Note (Signed)
Currently asymptomatic. Will send Nystatin powder prn when rash reoccurs.

## 2018-03-03 NOTE — Assessment & Plan Note (Signed)
Candida and BV vaginitis. I called and discussed result with her. She is aware that I have escribed meds to her pharm. GC/Chlamydia pending.

## 2018-03-03 NOTE — Patient Instructions (Signed)
It was nice seeing you. I will call with test results. Please take Lantus 20 units daily. Monitor CBG at home. I will see you in 2 weeks.

## 2018-03-03 NOTE — Telephone Encounter (Signed)
Wet prep and FOBT result discussed. See encounter note for management plan.

## 2018-03-03 NOTE — Progress Notes (Signed)
Subjective:     Patient ID: Lisa Marshall, female   DOB: 06-18-1996, 22 y.o.   MRN: [id number]  Diabetes  She presents for her follow-up diabetic visit. She has type 2 diabetes mellitus. Her disease course has been fluctuating. There are no hypoglycemic associated symptoms. Pertinent negatives for diabetes include no blurred vision, no chest pain, no polydipsia, no polyphagia and no weakness. There are no hypoglycemic complications. Current diabetic treatment includes oral agent (dual therapy) (Was supposed to be on Lantus 10 units daily but self increased to 20 BID two days ago due to high glucose level. She is also on Metformin 1000mg  BID and Glipizide 10 mg qd.). Compliance with diabetes treatment: Lantus 20 units BID starting 2 days ago. CBG was 87 at 12 am. After breakfast it was 219. She self increased her insulin. Home blood sugar record trend: Home CBG has been less than 80 since she increased to 20 BID Lantus.  Rash  This is a new (Rash under breast creases B/L on and off. Currently not present. Here for follow-up.) problem. The current episode started more than 1 month ago. The problem has been waxing and waning since onset. Location: Underneath breast creases. The rash is characterized by dryness and redness. She was exposed to nothing. Pertinent negatives include no anorexia, diarrhea, fever or vomiting. Treatments tried: Some cream prescribed by a provider. The treatment provided significant relief. There is no history of asthma or eczema.  Vaginal Discharge  The patient's primary symptoms include vaginal discharge. This is a new problem. Episode onset: 3 days. The problem occurs constantly. The problem has been gradually worsening. The patient is experiencing no pain. Associated symptoms include rash. Pertinent negatives include no abdominal pain, anorexia, constipation, diarrhea, discolored urine, dysuria, fever, hematuria, nausea or vomiting. There has been no bleeding. Nothing  aggravates the symptoms. She has tried nothing for the symptoms. She is sexually active. It is unknown whether or not her partner has an STD.  Rectal Bleeding   The current episode started 5 to 7 days ago (Sees blood when she wifes in the last 2-3 days). The onset was sudden. The problem occurs occasionally. The problem has been unchanged. The patient is experiencing no pain. Associated symptoms include vaginal discharge and rash. Pertinent negatives include no anorexia, no fever, no abdominal pain, no diarrhea, no nausea, no rectal pain, no vomiting, no hematuria and no chest pain.  Thrush: Whitish stuff on tongue for some days now. No pain, no difficulty with eating. HM: Need vaccine update.  Current Outpatient Medications on File Prior to Visit  Medication Sig Dispense Refill  . glipiZIDE (GLUCOTROL) 10 MG tablet Take 1 tablet (10 mg total) by mouth daily. 30 tablet 3  . metFORMIN (GLUCOPHAGE) 500 MG tablet Take 2 tablets (1,000 mg total) by mouth 2 (two) times daily with a meal. 120 tablet 3  . Omega-3 Fatty Acids (FISH OIL) 1000 MG CAPS Take 1,000 capsules by mouth.    Marland Kitchen albuterol (PROVENTIL HFA;VENTOLIN HFA) 108 (90 Base) MCG/ACT inhaler Inhale into the lungs every 6 (six) hours as needed for wheezing or shortness of breath.    . BD PEN NEEDLE NANO U/F 32G X 4 MM MISC INJECT 10 UNITS DAILY AT 10PM 100 each 3  . cyclobenzaprine (FLEXERIL) 5 MG tablet Take 1 tablet (5 mg total) by mouth 3 (three) times daily as needed for muscle spasms. (Patient not taking: Reported on 03/03/2018) 30 tablet 0  . gabapentin (NEURONTIN) 300 MG capsule Take 1  capsule (300 mg total) by mouth 3 (three) times daily. 90 capsule 3  . ibuprofen (ADVIL,MOTRIN) 800 MG tablet Take 1 tablet (800 mg total) by mouth 3 (three) times daily. (Patient not taking: Reported on 03/03/2018) 30 tablet 0  . LANTUS SOLOSTAR 100 UNIT/ML Solostar Pen INJECT 10 UNITS INTO THE SKIN DAILY AT 10 PM 15 pen 1  . MedroxyPROGESTERone Acetate  (DEPO-PROVERA IM) Inject into the muscle every 3 (three) months. Last injection 1st part of August 2018    . promethazine (PHENERGAN) 25 MG tablet Take 1 tablet (25 mg total) by mouth every 6 (six) hours as needed for nausea or vomiting. 20 tablet 0  . traMADol (ULTRAM) 50 MG tablet Take 1 tablet (50 mg total) by mouth every 6 (six) hours as needed. (Patient not taking: Reported on 03/03/2018) 15 tablet 0   No current facility-administered medications on file prior to visit.    Past Medical History:  Diagnosis Date  . Asthma   . Depression   . Encounter for surveillance of injectable contraceptive 12/23/2014  . History of PID   . Pregnancy, multiple   . Syncope    Vitals:   03/03/18 0937  BP: 112/74  Pulse: 100  Temp: 98.2 F (36.8 C)  TempSrc: Oral  SpO2: 99%  Weight: 261 lb (118.4 kg)  Height: 5\' 3"  (1.6 m)     Review of Systems  Constitutional: Negative.  Negative for fever.  HENT:       Oral thrush  Eyes: Negative for blurred vision.  Respiratory: Negative.   Cardiovascular: Negative.  Negative for chest pain.  Gastrointestinal: Positive for hematochezia. Negative for abdominal pain, anorexia, constipation, diarrhea, nausea, rectal pain and vomiting.  Endocrine: Negative for polydipsia and polyphagia.  Genitourinary: Positive for vaginal discharge. Negative for dysuria and hematuria.  Skin: Positive for rash.  Neurological: Negative.  Negative for weakness.  All other systems reviewed and are negative.      Objective:   Physical Exam  Constitutional: She appears well-developed. No distress.  HENT:  Mouth/Throat:    Cardiovascular: Normal rate, regular rhythm and normal heart sounds.  No murmur heard. Pulmonary/Chest: Effort normal and breath sounds normal. No stridor. No respiratory distress. She has no wheezes.  Abdominal: Soft. Bowel sounds are normal. She exhibits no distension and no mass. There is no tenderness.  Genitourinary: Uterus normal. Rectal exam  shows no external hemorrhoid, no internal hemorrhoid, no fissure, no mass, no tenderness, anal tone normal and guaiac negative stool. There is no tenderness on the right labia. There is no tenderness on the left labia. Cervix exhibits discharge. Cervix exhibits no motion tenderness. Right adnexum displays no mass and no tenderness. Left adnexum displays no mass and no tenderness. Vaginal discharge found.  Musculoskeletal: She exhibits no edema.  Skin:  No rash seen under breast creases  Nursing note and vitals reviewed.      Assessment:     DM2 Candida intertrigo Vaginitis Anal bleed Oral thrush    Plan:     Check problem list. Pneumonia shot will be discussed at next visit.

## 2018-03-03 NOTE — Assessment & Plan Note (Signed)
Rectal exam benign. Neg FOBT. Result discussed with her. Likely related ti hard stool. High fiber diet recommended. May use Miralax daily as needed. Return soon if no improvement. She agreed with the plan.,

## 2018-03-04 LAB — CERVICOVAGINAL ANCILLARY ONLY
Chlamydia: NEGATIVE
Neisseria Gonorrhea: NEGATIVE

## 2018-03-18 ENCOUNTER — Other Ambulatory Visit: Payer: Self-pay | Admitting: Family Medicine

## 2018-04-14 ENCOUNTER — Other Ambulatory Visit: Payer: Self-pay

## 2018-04-14 ENCOUNTER — Encounter: Payer: Self-pay | Admitting: Family Medicine

## 2018-04-14 ENCOUNTER — Ambulatory Visit (INDEPENDENT_AMBULATORY_CARE_PROVIDER_SITE_OTHER): Payer: Managed Care, Other (non HMO) | Admitting: Family Medicine

## 2018-04-14 VITALS — BP 100/80 | HR 97 | Temp 98.8°F | Wt 270.0 lb

## 2018-04-14 DIAGNOSIS — F39 Unspecified mood [affective] disorder: Secondary | ICD-10-CM

## 2018-04-14 DIAGNOSIS — E1165 Type 2 diabetes mellitus with hyperglycemia: Secondary | ICD-10-CM

## 2018-04-14 DIAGNOSIS — E1140 Type 2 diabetes mellitus with diabetic neuropathy, unspecified: Secondary | ICD-10-CM | POA: Diagnosis not present

## 2018-04-14 DIAGNOSIS — N912 Amenorrhea, unspecified: Secondary | ICD-10-CM

## 2018-04-14 DIAGNOSIS — E119 Type 2 diabetes mellitus without complications: Secondary | ICD-10-CM

## 2018-04-14 LAB — POCT URINE PREGNANCY: Preg Test, Ur: NEGATIVE

## 2018-04-14 NOTE — Progress Notes (Signed)
Subjective:     Patient ID: Lisa Marshall, female   DOB: [birth year], 22 y.o.   MRN: [id number]  HPI DM2: Here for f/u. She is compliant with Metformin 1000 mg BID and Glipizide 10 mg qd. She stated her Glucose had been in the 140 range. She had and episode of hypoglycemia yesterday at 3564 before lunch, she was a bit shaky yesterday.  Obesity:Walks 4 miles day. Working on improving her diet. Preg?: She is concern that she might be pregnant. She stated that she had an unprotected sex 3 weeks ago. She is on Depo but still concern. No pregnancy symptoms. She gets Depo with her OB/GYN.   Current Outpatient Medications on File Prior to Visit  Medication Sig Dispense Refill  . albuterol (PROVENTIL HFA;VENTOLIN HFA) 108 (90 Base) MCG/ACT inhaler Inhale into the lungs every 6 (six) hours as needed for wheezing or shortness of breath.    . BD PEN NEEDLE NANO U/F 32G X 4 MM MISC INJECT 10 UNITS DAILY AT 10PM 100 each 3  . cyclobenzaprine (FLEXERIL) 5 MG tablet Take 1 tablet (5 mg total) by mouth 3 (three) times daily as needed for muscle spasms. 30 tablet 0  . gabapentin (NEURONTIN) 300 MG capsule Take 1 capsule (300 mg total) by mouth 3 (three) times daily. 90 capsule 3  . glipiZIDE (GLUCOTROL) 10 MG tablet Take 1 tablet (10 mg total) by mouth daily. 30 tablet 3  . ibuprofen (ADVIL,MOTRIN) 800 MG tablet Take 1 tablet (800 mg total) by mouth 3 (three) times daily. 30 tablet 0  . Insulin Glargine (LANTUS SOLOSTAR) 100 UNIT/ML Solostar Pen Inject 20 Units into the skin daily. 15 pen 3  . MedroxyPROGESTERone Acetate (DEPO-PROVERA IM) Inject into the muscle every 3 (three) months. Last injection 1st part of August 2018    . metFORMIN (GLUCOPHAGE) 500 MG tablet TAKE 2 TABLETS BY MOUTH TWICE DAILY WITH FOOD 180 tablet 2  . nystatin (NYSTATIN) powder Apply to breast creases when you have rash twice a day 15 g 0  . Omega-3 Fatty Acids (FISH OIL) 1000 MG CAPS Take 1,000 capsules by mouth.    . traMADol  (ULTRAM) 50 MG tablet Take 1 tablet (50 mg total) by mouth every 6 (six) hours as needed. 15 tablet 0  . promethazine (PHENERGAN) 25 MG tablet Take 1 tablet (25 mg total) by mouth every 6 (six) hours as needed for nausea or vomiting. (Patient not taking: Reported on 04/14/2018) 20 tablet 0   No current facility-administered medications on file prior to visit.    Past Medical History:  Diagnosis Date  . Asthma   . Depression   . Encounter for surveillance of injectable contraceptive 12/23/2014  . History of PID   . Paronychia of great toe of left foot 10/03/2017  . Pregnancy, multiple   . Syncope      Review of Systems  Respiratory: Negative.   Cardiovascular: Negative.   Gastrointestinal: Negative.   Genitourinary: Negative.   Musculoskeletal: Negative.   Neurological: Negative.   All other systems reviewed and are negative.      Objective:   Physical Exam  Constitutional: She is oriented to person, place, and time. She appears well-developed. No distress.  Cardiovascular: Normal rate, regular rhythm, normal heart sounds and intact distal pulses.  No murmur heard. Pulmonary/Chest: Effort normal and breath sounds normal. No stridor. No respiratory distress.  Abdominal: Soft. Bowel sounds are normal. She exhibits no distension and no mass. There is no tenderness. There is  no guarding.  Musculoskeletal: Normal range of motion. She exhibits no edema.  Neurological: She is alert and oriented to person, place, and time.  Nursing note and vitals reviewed.      Assessment:     DM2 Obesity Amenorrhea    Plan:     Check problem list.  We are out of vaccine today. Return in 1 week for Pneumonia and flu shot.

## 2018-04-14 NOTE — Assessment & Plan Note (Signed)
On Depo. Upreg neg. Continue contraceptive care with OB/Gyn.

## 2018-04-14 NOTE — Patient Instructions (Signed)

## 2018-04-14 NOTE — Assessment & Plan Note (Signed)
Nutrition and exercise counseling done. Referred to a nutritionist. She will call for an appointment.

## 2018-04-14 NOTE — Assessment & Plan Note (Addendum)
Improving. Continue current regimen. Return in 2-3 months for reassessment. Continue home CBG monitoring. Schedule f/u with ophthalmologist for eye exam.

## 2018-04-17 ENCOUNTER — Encounter (HOSPITAL_COMMUNITY): Payer: Self-pay

## 2018-04-17 ENCOUNTER — Ambulatory Visit (HOSPITAL_COMMUNITY)
Admission: EM | Admit: 2018-04-17 | Discharge: 2018-04-17 | Disposition: A | Discharge status: Home / Self Care | Payer: Managed Care, Other (non HMO) | Attending: Family Medicine | Admitting: Family Medicine

## 2018-04-17 DIAGNOSIS — K0889 Other specified disorders of teeth and supporting structures: Secondary | ICD-10-CM | POA: Diagnosis not present

## 2018-04-17 DIAGNOSIS — K047 Periapical abscess without sinus: Secondary | ICD-10-CM

## 2018-04-17 MED ORDER — CLINDAMYCIN HCL 300 MG PO CAPS: 300.0000 mg | ORAL_CAPSULE | Freq: Three times a day (TID) | ORAL | 0 refills | Status: DC

## 2018-04-17 MED ORDER — IBUPROFEN 800 MG PO TABS: 800.0000 mg | ORAL_TABLET | Freq: Three times a day (TID) | ORAL | 0 refills | Status: DC

## 2018-04-17 NOTE — Discharge Instructions (Addendum)
Take the antibiotic clindamycin 3 times a day This is for the dental infection Take ibuprofen 3 times a day with food.  This is for pain Call your dentist Monday to set up an appointment

## 2018-04-17 NOTE — ED Triage Notes (Signed)
Pt presents with dental pain on both sides that radiates up to her ears with no relief

## 2018-04-17 NOTE — ED Provider Notes (Signed)
[facility]    CSN: [id number] Arrival date & time: 04/17/18  1607     History   Chief Complaint Chief Complaint  Patient presents with  . Dental Pain    HPI NAYLIN BURKLE is a 22 y.o. female.   HPI  Is here for dental pain.  She has pain from her "wisdom teeth".  Is been present for 2 days.  She states is on both of the upper teeth radiating into her ears.  No redness or swelling.  Does not have regular dental care.  No sore throat.  No fever.  Past Medical History:  Diagnosis Date  . Asthma   . Depression   . Encounter for surveillance of injectable contraceptive 12/23/2014  . History of PID   . Paronychia of great toe of left foot 10/03/2017  . Pregnancy, multiple   . Syncope     Patient Active Problem List   Diagnosis Date Noted  . Candidal intertrigo 03/03/2018  . Hypertriglyceridemia 05/30/2017  . Vaginal bleeding 05/30/2017  . Poorly controlled type 2 diabetes mellitus with neuropathy (HCC) 05/23/2017  . Amenorrhea 12/08/2015  . Episodic mood disorder (HCC) 06/21/2013  . Morbid obesity (HCC) 11/06/2007  . GERD 11/06/2007  . ASTHMA, INTERMITTENT 10/23/2006    Past Surgical History:  Procedure Laterality Date  . ADENOIDECTOMY    . CESAREAN SECTION N/A 11/18/2013   Procedure: Primary Cesarean Section Delivery Baby "A" Girl @ 0447, Apgars 1/2/4, Baby "B" @ 0450, Apgars 8/9;  Surgeon: Kathreen Cosier, MD;  Location: [facility];  Service: Obstetrics;  Laterality: N/A;  . TONSILLECTOMY      OB History    Gravida  1   Para  1   Term  1   Preterm  0   AB  0   Living  2     SAB  0   TAB  0   Ectopic  0   Multiple  1   Live Births  2            Home Medications    Prior to Admission medications   Medication Sig Start Date End Date Taking? Authorizing Provider  albuterol (PROVENTIL HFA;VENTOLIN HFA) 108 (90 Base) MCG/ACT inhaler Inhale into the lungs every 6 (six) hours as needed for wheezing or shortness of breath.     [provider]  BD PEN NEEDLE NANO U/F 32G X 4 MM MISC INJECT 10 UNITS DAILY AT 10PM 11/17/17   Moses Manners, MD  clindamycin (CLEOCIN) 300 MG capsule Take 1 capsule (300 mg total) by mouth 3 (three) times daily. 04/17/18   Eustace Moore, MD  gabapentin (NEURONTIN) 300 MG capsule Take 1 capsule (300 mg total) by mouth 3 (three) times daily. 05/30/17   Doreene Eland, MD  glipiZIDE (GLUCOTROL) 10 MG tablet Take 1 tablet (10 mg total) by mouth daily. 03/03/18   Doreene Eland, MD  ibuprofen (ADVIL,MOTRIN) 800 MG tablet Take 1 tablet (800 mg total) by mouth 3 (three) times daily. 04/17/18   Eustace Moore, MD  Insulin Glargine (LANTUS SOLOSTAR) 100 UNIT/ML Solostar Pen Inject 20 Units into the skin daily. 03/03/18   Doreene Eland, MD  MedroxyPROGESTERone Acetate (DEPO-PROVERA IM) Inject into the muscle every 3 (three) months. Last injection 1st part of August 2018    [provider]  metFORMIN (GLUCOPHAGE) 500 MG tablet TAKE 2 TABLETS BY MOUTH TWICE DAILY WITH FOOD 03/18/18   Doreene Eland, MD  nystatin (  NYSTATIN) powder Apply to breast creases when you have rash twice a day 03/03/18   Doreene ElandEniola, Kehinde T, MD  Omega-3 Fatty Acids (FISH OIL) 1000 MG CAPS Take 1,000 capsules by mouth.    [provider]    Family History Family History  Problem Relation Age of Onset  . Asthma Other   . Diabetes Other   . Cancer Other   . Sickle cell anemia Mother     Social History Social History   Tobacco Use  . Smoking status: Never Smoker  . Smokeless tobacco: Never Used  Substance Use Topics  . Alcohol use: No  . Drug use: No     Allergies   Penicillins   Review of Systems Review of Systems  Constitutional: Negative for chills and fever.  HENT: Positive for dental problem. Negative for ear pain and sore throat.   Eyes: Negative for pain and visual disturbance.  Respiratory: Negative for cough and shortness of breath.   Cardiovascular: Negative for  chest pain and palpitations.  Gastrointestinal: Negative for abdominal pain and vomiting.  Genitourinary: Negative for dysuria and hematuria.  Musculoskeletal: Negative for arthralgias and back pain.  Skin: Negative for color change and rash.  Neurological: Negative for seizures and syncope.  All other systems reviewed and are negative.    Physical Exam Triage Vital Signs ED Triage Vitals  Enc Vitals Group     BP 04/17/18 1629 106/68     Pulse Rate 04/17/18 1629 100     Resp 04/17/18 1629 20     Temp 04/17/18 1629 98.1 F (36.7 C)     Temp Source 04/17/18 1629 Oral     SpO2 04/17/18 1629 99 %     Weight --      Height --      Head Circumference --      Peak Flow --      Pain Score 04/17/18 1627 10     Pain Loc --      Pain Edu? --      Excl. in GC? --    No data found.  Updated Vital Signs BP 106/68 (BP Location: Right Arm)   Pulse 100   Temp 98.1 F (36.7 C) (Oral)   Resp 20   LMP  (LMP Unknown)   SpO2 99%       Physical Exam  Constitutional: She appears well-developed and well-nourished. No distress.  HENT:  Head: Normocephalic and atraumatic.  Right Ear: External ear normal.  Left Ear: External ear normal.  Mouth/Throat: Oropharynx is clear and moist.  Poor oral hygiene, periodontal disease in general.  No specific abscess.  Eyes: Pupils are equal, round, and reactive to light. Conjunctivae are normal.  Neck: Normal range of motion.  Cardiovascular: Normal rate.  Pulmonary/Chest: Effort normal. No respiratory distress.  Abdominal: Soft. She exhibits no distension.  Musculoskeletal: Normal range of motion. She exhibits no edema.  Lymphadenopathy:    She has no cervical adenopathy.  Neurological: She is alert.  Skin: Skin is warm and dry.     UC Treatments / Results  Labs (all labs ordered are listed, but only abnormal results are displayed) Labs Reviewed - No data to display  EKG None  Radiology No results found.  Procedures Procedures  (including critical care time)  Medications Ordered in UC Medications - No data to display  Initial Impression / Assessment and Plan / UC Course  I have reviewed the triage vital signs and the nursing notes.  Pertinent labs &  imaging results that were available during my care of the patient were reviewed by me and considered in my medical decision making (see chart for details).    I told patient that most dental pain was from infection.  We will try to discharge her with an antibiotic.  I will prescribe ibuprofen for pain.  She is to call a dentist for follow-up. Final Clinical Impressions(s) / UC Diagnoses   Final diagnoses:  Dental infection     Discharge Instructions     Take the antibiotic clindamycin 3 times a day This is for the dental infection Take ibuprofen 3 times a day with food.  This is for pain Call your dentist Monday to set up an appointment   ED Prescriptions    Medication Sig Dispense Auth. Provider   ibuprofen (ADVIL,MOTRIN) 800 MG tablet Take 1 tablet (800 mg total) by mouth 3 (three) times daily. 30 tablet Eustace Moore, MD   clindamycin (CLEOCIN) 300 MG capsule Take 1 capsule (300 mg total) by mouth 3 (three) times daily. 21 capsule Eustace Moore, MD     Controlled Substance Prescriptions  Controlled Substance Registry consulted? Not Applicable   Eustace Moore, MD 04/17/18 [phone]

## 2018-05-13 LAB — HM DIABETES EYE EXAM

## 2018-05-19 ENCOUNTER — Ambulatory Visit: Payer: Self-pay

## 2018-05-22 ENCOUNTER — Telehealth: Payer: Self-pay | Admitting: Dietician

## 2018-05-22 NOTE — Telephone Encounter (Signed)
Brief Nutrition Note Patient did not show up for the first Diabetes Core Class on 05/19/18.   Called patient who stated that she would prefer a one on one visit with the dietitian.   States that she had lost from 375 lbs to 234 lbs and then due to injury and depo has regained to 277 lbs.  She would like help losing weight.  She is asking about medication for weight loss.  Discussed that there is a diabetes medication that can help with this if her insurance covers this.  She states that she follows the keto diet.  Patient's appointment is with myself June 11, 2018 at 8:45.  Oran Rein, RD, LDN, CDE

## 2018-05-26 ENCOUNTER — Ambulatory Visit: Payer: Self-pay

## 2018-06-02 ENCOUNTER — Ambulatory Visit: Payer: Self-pay

## 2018-06-09 ENCOUNTER — Other Ambulatory Visit: Payer: Self-pay

## 2018-06-09 ENCOUNTER — Ambulatory Visit (HOSPITAL_COMMUNITY)
Admission: EM | Admit: 2018-06-09 | Discharge: 2018-06-09 | Disposition: A | Discharge status: Home / Self Care | Payer: Managed Care, Other (non HMO) | Attending: Family Medicine | Admitting: Family Medicine

## 2018-06-09 ENCOUNTER — Encounter (HOSPITAL_COMMUNITY): Payer: Self-pay | Admitting: Emergency Medicine

## 2018-06-09 ENCOUNTER — Ambulatory Visit (INDEPENDENT_AMBULATORY_CARE_PROVIDER_SITE_OTHER): Payer: Managed Care, Other (non HMO)

## 2018-06-09 DIAGNOSIS — S96912A Strain of unspecified muscle and tendon at ankle and foot level, left foot, initial encounter: Secondary | ICD-10-CM

## 2018-06-09 MED ORDER — NAPROXEN 500 MG PO TABS: 500.0000 mg | ORAL_TABLET | Freq: Two times a day (BID) | ORAL | 0 refills | Status: DC
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
## 2018-06-09 NOTE — Discharge Instructions (Signed)
We will put a cam walker only on left foot. Rest, ice, elevate and naproxen twice a day with food Work note given to rest foot Follow up as needed for continued or worsening symptoms
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
## 2018-06-11 ENCOUNTER — Telehealth: Payer: Self-pay | Admitting: Dietician

## 2018-06-11 ENCOUNTER — Ambulatory Visit: Payer: Self-pay | Admitting: Dietician

## 2018-06-11 NOTE — Telephone Encounter (Signed)
Brief Nutrition Note Called patient due to missed appointment today.  This is the second missed appointment.   Patient was not available.  Left a message for her to call to reschedule this appointment.  Oran Rein, RD, LDN, CDE

## 2018-07-03 ENCOUNTER — Encounter (HOSPITAL_COMMUNITY): Payer: Self-pay | Admitting: Emergency Medicine

## 2018-07-03 ENCOUNTER — Other Ambulatory Visit: Payer: Self-pay

## 2018-07-03 ENCOUNTER — Ambulatory Visit (HOSPITAL_COMMUNITY)
Admission: EM | Admit: 2018-07-03 | Discharge: 2018-07-03 | Disposition: A | Discharge status: Home / Self Care | Payer: Managed Care, Other (non HMO) | Attending: Physician Assistant | Admitting: Physician Assistant

## 2018-07-03 DIAGNOSIS — M79672 Pain in left foot: Secondary | ICD-10-CM

## 2018-07-03 DIAGNOSIS — M79671 Pain in right foot: Secondary | ICD-10-CM | POA: Diagnosis not present

## 2018-07-03 DIAGNOSIS — R2241 Localized swelling, mass and lump, right lower limb: Secondary | ICD-10-CM

## 2018-07-03 DIAGNOSIS — E1165 Type 2 diabetes mellitus with hyperglycemia: Secondary | ICD-10-CM | POA: Diagnosis not present

## 2018-07-03 DIAGNOSIS — R2242 Localized swelling, mass and lump, left lower limb: Secondary | ICD-10-CM

## 2018-07-03 LAB — POCT I-STAT, CHEM 8
BUN: 14 mg/dL (ref 6–20)
CALCIUM ION: 1.23 mmol/L (ref 1.15–1.40)
CREATININE: 0.5 mg/dL (ref 0.44–1.00)
Chloride: 102 mmol/L (ref 98–111)
Glucose, Bld: 528 mg/dL (ref 70–99)
HCT: 43 % (ref 36.0–46.0)
HEMOGLOBIN: 14.6 g/dL (ref 12.0–15.0)
Potassium: 4 mmol/L (ref 3.5–5.1)
Sodium: 135 mmol/L (ref 135–145)
TCO2: 23 mmol/L (ref 22–32)

## 2018-07-03 MED ORDER — FUROSEMIDE 20 MG PO TABS: 20.0000 mg | ORAL_TABLET | Freq: Every day | ORAL | 0 refills | Status: DC

## 2018-07-03 MED ORDER — INSULIN ASPART 100 UNIT/ML ~~LOC~~ SOLN
10.0000 [IU] | Freq: Once | SUBCUTANEOUS | Status: AC
  Administered 2018-07-03: 10 [IU] via SUBCUTANEOUS

## 2018-07-03 MED ORDER — INSULIN ASPART 100 UNIT/ML ~~LOC~~ SOLN
SUBCUTANEOUS | Status: AC
  Filled 2018-07-03: qty 1

## 2018-07-03 MED ORDER — HYDROCODONE-ACETAMINOPHEN 5-325 MG PO TABS: 2.0000 | ORAL_TABLET | ORAL | 0 refills | Status: DC | PRN

## 2018-07-03 NOTE — Discharge Instructions (Addendum)
Call your Physician on Monday to schedule to be seen for recheck.

## 2018-07-03 NOTE — ED Provider Notes (Signed)
[facility]    CSN: [id number] Arrival date & time: 07/03/18  1707     History   Chief Complaint Chief Complaint  Patient presents with  . Foot Pain    HPI Lisa Marshall is a 22 y.o. female.   The history is provided by the patient. No language interpreter was used.  Foot Pain  This is a new problem. The current episode started 2 days ago. The problem occurs constantly. The problem has not changed since onset.Nothing aggravates the symptoms. Nothing relieves the symptoms. She has tried nothing for the symptoms. The treatment provided no relief.   Pt complains of swelling and pain in both legs.  Pt reports she injured right ankle earlier this year and left a few weeks ago.   Past Medical History:  Diagnosis Date  . Asthma   . Depression   . Encounter for surveillance of injectable contraceptive 12/23/2014  . History of PID   . Paronychia of great toe of left foot 10/03/2017  . Pregnancy, multiple   . Syncope     Patient Active Problem List   Diagnosis Date Noted  . Candidal intertrigo 03/03/2018  . Hypertriglyceridemia 05/30/2017  . Vaginal bleeding 05/30/2017  . Poorly controlled type 2 diabetes mellitus with neuropathy (HCC) 05/23/2017  . Amenorrhea 12/08/2015  . Episodic mood disorder (HCC) 06/21/2013  . Morbid obesity (HCC) 11/06/2007  . GERD 11/06/2007  . ASTHMA, INTERMITTENT 10/23/2006    Past Surgical History:  Procedure Laterality Date  . ADENOIDECTOMY    . CESAREAN SECTION N/A 11/18/2013   Procedure: Primary Cesarean Section Delivery Baby "A" Girl @ 0447, Apgars 1/2/4, Baby "B" @ 0450, Apgars 8/9;  Surgeon: Kathreen Cosier, MD;  Location: [facility];  Service: Obstetrics;  Laterality: N/A;  . TONSILLECTOMY      OB History    Gravida  1   Para  1   Term  1   Preterm  0   AB  0   Living  2     SAB  0   TAB  0   Ectopic  0   Multiple  1   Live Births  2            Home Medications    Prior to Admission medications    Medication Sig Start Date End Date Taking? Authorizing Provider  albuterol (PROVENTIL HFA;VENTOLIN HFA) 108 (90 Base) MCG/ACT inhaler Inhale into the lungs every 6 (six) hours as needed for wheezing or shortness of breath.    [provider]  BD PEN NEEDLE NANO U/F 32G X 4 MM MISC INJECT 10 UNITS DAILY AT 10PM 11/17/17   Moses Manners, MD  clindamycin (CLEOCIN) 300 MG capsule Take 1 capsule (300 mg total) by mouth 3 (three) times daily. 04/17/18   Eustace Moore, MD  cyclobenzaprine (FLEXERIL) 5 MG tablet cyclobenzaprine 5 mg tablet  take 1 tablet by mouth twice a day if needed for muscle spasm    [provider]  fluconazole (DIFLUCAN) 150 MG tablet TAKE 1 TABLET BY MOUTH AS ONE DOSE 03/03/18   [provider]  gabapentin (NEURONTIN) 300 MG capsule Take 1 capsule (300 mg total) by mouth 3 (three) times daily. 05/30/17   Doreene Eland, MD  glipiZIDE (GLUCOTROL) 10 MG tablet Take 1 tablet (10 mg total) by mouth daily. 03/03/18   Doreene Eland, MD  HYDROcodone-acetaminophen (NORCO/VICODIN) 5-325 MG tablet Take 2 tablets by mouth every 4 (four) hours as needed.  07/03/18   Elson Areas, [name]  ibuprofen (ADVIL,MOTRIN) 800 MG tablet Take 1 tablet (800 mg total) by mouth 3 (three) times daily. 04/17/18   Eustace Moore, MD  Insulin Glargine (LANTUS SOLOSTAR) 100 UNIT/ML Solostar Pen Inject 20 Units into the skin daily. 03/03/18   Doreene Eland, MD  MedroxyPROGESTERone Acetate (DEPO-PROVERA IM) Inject into the muscle every 3 (three) months. Last injection 1st part of August 2018    [provider]  metFORMIN (GLUCOPHAGE) 500 MG tablet TAKE 2 TABLETS BY MOUTH TWICE DAILY WITH FOOD 03/18/18   Doreene Eland, MD  naproxen (NAPROSYN) 500 MG tablet Take 1 tablet (500 mg total) by mouth 2 (two) times daily. 06/09/18   Dahlia Byes A, NP  nystatin (NYSTATIN) powder Apply to breast creases when you have rash twice a day 03/03/18   Doreene Eland, MD  Omega-3  Fatty Acids (FISH OIL) 1000 MG CAPS Take 1,000 capsules by mouth.    [provider]    Family History Family History  Problem Relation Age of Onset  . Asthma Other   . Diabetes Other   . Cancer Other   . Sickle cell anemia Mother     Social History Social History   Tobacco Use  . Smoking status: Never Smoker  . Smokeless tobacco: Never Used  Substance Use Topics  . Alcohol use: No  . Drug use: No     Allergies   Penicillins   Review of Systems Review of Systems  Musculoskeletal: Positive for joint swelling.  All other systems reviewed and are negative.    Physical Exam Triage Vital Signs ED Triage Vitals  Enc Vitals Group     BP 07/03/18 1747 123/84     Pulse Rate 07/03/18 1747 93     Resp 07/03/18 1747 20     Temp 07/03/18 1747 97.8 F (36.6 C)     Temp Source 07/03/18 1747 Oral     SpO2 07/03/18 1747 97 %     Weight --      Height --      Head Circumference --      Peak Flow --      Pain Score 07/03/18 1744 10     Pain Loc --      Pain Edu? --      Excl. in GC? --    No data found.  Updated Vital Signs BP 123/84 (BP Location: Left Arm) Comment (BP Location): large cuff  Pulse 93   Temp 97.8 F (36.6 C) (Oral)   Resp 20   SpO2 97%   Visual Acuity Right Eye Distance:   Left Eye Distance:   Bilateral Distance:    Right Eye Near:   Left Eye Near:    Bilateral Near:     Physical Exam  Constitutional: She is oriented to person, place, and time. She appears well-developed and well-nourished.  Cardiovascular: Normal rate.  Pulmonary/Chest: Effort normal.  Musculoskeletal: She exhibits edema.  Neurological: She is alert and oriented to person, place, and time.  Skin: Skin is warm.  Psychiatric: She has a normal mood and affect.  Nursing note and vitals reviewed.    UC Treatments / Results  Labs (all labs ordered are listed, but only abnormal results are displayed) Labs Reviewed  POCT I-STAT, CHEM 8 - Abnormal; Notable for  the following components:      Result Value   Glucose, Bld 528 (*)    All other components within normal limits  EKG None  Radiology No results found.  Procedures Procedures (including critical care time)  Medications Ordered in UC Medications  insulin aspart (novoLOG) injection 10 Units (10 Units Subcutaneous Given 07/03/18 1913)    Initial Impression / Assessment and Plan / UC Course  I have reviewed the triage vital signs and the nursing notes.  Pertinent labs & imaging results that were available during my care of the patient were reviewed by me and considered in my medical decision making (see chart for details).     Glucose elevated to over 500,  Pt given 10 units subq.  Insulin.  Pt advised to call [facility] on Monday to be seen.  Pt thinks she needs to see endocrinologist.  Pt given numbers for 2 to try to obtain appointment (not on call) Pt advised she may need referral from her primary.  Pt given hydrocodone for pain 10 tablets, lasix for swelling  Final Clinical Impressions(s) / UC Diagnoses   Final diagnoses:  Foot pain, right  Foot pain, left     Discharge Instructions     Call your Physician on Monday to schedule to be seen for recheck.    ED Prescriptions    Medication Sig Dispense Auth. Provider   HYDROcodone-acetaminophen (NORCO/VICODIN) 5-325 MG tablet Take 2 tablets by mouth every 4 (four) hours as needed. 10 tablet Elson Areas, [place]     Controlled Substance Prescriptions [place] Controlled Substance Registry consulted? Not Applicable   Elson Areas, [place] 07/03/18 2020

## 2018-07-03 NOTE — ED Triage Notes (Signed)
Bilateral foot pain, shooting up legs that worsened over the past 2 days.

## 2018-07-07 ENCOUNTER — Other Ambulatory Visit (HOSPITAL_COMMUNITY): Payer: Self-pay | Admitting: Orthopedic Surgery

## 2018-07-07 ENCOUNTER — Ambulatory Visit (INDEPENDENT_AMBULATORY_CARE_PROVIDER_SITE_OTHER): Payer: Managed Care, Other (non HMO) | Admitting: Family Medicine

## 2018-07-07 ENCOUNTER — Other Ambulatory Visit: Payer: Self-pay

## 2018-07-07 ENCOUNTER — Other Ambulatory Visit: Payer: Self-pay | Admitting: Family Medicine

## 2018-07-07 VITALS — BP 128/64 | HR 100 | Temp 98.3°F | Wt 274.0 lb

## 2018-07-07 DIAGNOSIS — Z23 Encounter for immunization: Secondary | ICD-10-CM | POA: Diagnosis not present

## 2018-07-07 DIAGNOSIS — R609 Edema, unspecified: Secondary | ICD-10-CM

## 2018-07-07 DIAGNOSIS — M25561 Pain in right knee: Secondary | ICD-10-CM

## 2018-07-07 DIAGNOSIS — M25562 Pain in left knee: Secondary | ICD-10-CM

## 2018-07-07 DIAGNOSIS — E1165 Type 2 diabetes mellitus with hyperglycemia: Secondary | ICD-10-CM | POA: Diagnosis not present

## 2018-07-07 DIAGNOSIS — B372 Candidiasis of skin and nail: Secondary | ICD-10-CM

## 2018-07-07 DIAGNOSIS — E1140 Type 2 diabetes mellitus with diabetic neuropathy, unspecified: Secondary | ICD-10-CM

## 2018-07-07 LAB — POCT GLYCOSYLATED HEMOGLOBIN (HGB A1C): HBA1C, POC (CONTROLLED DIABETIC RANGE): 12.5 % — AB (ref 0.0–7.0)

## 2018-07-07 LAB — GLUCOSE, POCT (MANUAL RESULT ENTRY): POC Glucose: 416 mg/dl — AB (ref 70–99)

## 2018-07-07 MED ORDER — FLUCONAZOLE 150 MG PO TABS: ORAL_TABLET | ORAL | 0 refills | Status: DC

## 2018-07-07 MED ORDER — NYSTATIN 100000 UNIT/GM EX POWD: CUTANEOUS | 0 refills | Status: DC

## 2018-07-07 MED ORDER — INSULIN GLARGINE 100 UNIT/ML SOLOSTAR PEN: 20.0000 [IU] | PEN_INJECTOR | Freq: Every day | SUBCUTANEOUS | 3 refills | Status: DC

## 2018-07-07 MED ORDER — DULAGLUTIDE 0.75 MG/0.5ML ~~LOC~~ SOAJ: 0.7500 mg | SUBCUTANEOUS | 0 refills | Status: DC

## 2018-07-07 NOTE — Assessment & Plan Note (Signed)
  Refilled nystatin powder. Follow up 2 weeks.
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
## 2018-07-07 NOTE — Assessment & Plan Note (Signed)
  Uncontrolled. Will add trulicity today, start at 0.75 mg weekly. Follow up 2 weeks, can titrate up if patient tolerating well. Discussed side effects and asked patient to monitor sugars closely. Continue other medications at this time. Patient verbalized understanding and agreement with plan.

## 2018-07-07 NOTE — Patient Instructions (Signed)
  Good to see you today! Please start trulicity today- call me if it's very costly. You can inject this every Tuesday. I'll see you back in 2 weeks to check in!  If you have questions or concerns please do not hesitate to call at [phone].  Dolores Patty, DO PGY-3, [facility] 07/07/2018 9:19 AM    Diet Recommendations for Diabetes   Starchy (carb) foods: Bread, rice, pasta, potatoes, corn, cereal, grits, crackers, bagels, muffins, all baked goods.  (Fruits, milk, and yogurt also have carbohydrate, but most of these foods will not spike your blood sugar as the starchy foods will.)  A few fruits do cause high blood sugars; use small portions of bananas (limit to 1/2 at a time), grapes, watermelon, oranges, and most tropical fruits.    Protein foods: Meat, fish, poultry, eggs, dairy foods, and beans such as pinto and kidney beans (beans also provide carbohydrate).   1. Eat at least 3 meals and 1-2 snacks per day. Never go more than 4-5 hours while awake without eating. Eat breakfast within the first hour of getting up.   2. Limit starchy foods to TWO per meal and ONE per snack. ONE portion of a starchy  food is equal to the following:   - ONE slice of bread (or its equivalent, such as half of a hamburger bun).   - 1/2 cup of a "scoopable" starchy food such as potatoes or rice.   - 15 grams of carbohydrate as shown on food label.  3. Include at every meal: a protein food, a carb food, and vegetables and/or fruit.   - Obtain twice the volume of veg's as protein or carbohydrate foods for both lunch and dinner.   - Fresh or frozen veg's are best.   - Keep frozen veg's on hand for a quick vegetable serving.

## 2018-07-08 ENCOUNTER — Ambulatory Visit (HOSPITAL_COMMUNITY)
Admission: RE | Admit: 2018-07-08 | Discharge: 2018-07-08 | Disposition: A | Discharge status: Home / Self Care | Payer: Managed Care, Other (non HMO) | Source: Ambulatory Visit | Attending: Family Medicine | Admitting: Family Medicine

## 2018-07-08 DIAGNOSIS — R609 Edema, unspecified: Secondary | ICD-10-CM | POA: Diagnosis present

## 2018-07-08 NOTE — Progress Notes (Signed)
LE venous duplex prelim: negative for DVT.  Farrel DemarkJill Eunice, RDMS, RVT     [facility]

## 2018-07-21 ENCOUNTER — Ambulatory Visit: Payer: Self-pay | Admitting: Family Medicine
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
## 2018-07-22 ENCOUNTER — Telehealth: Payer: Self-pay | Admitting: Family Medicine

## 2018-07-22 ENCOUNTER — Ambulatory Visit (INDEPENDENT_AMBULATORY_CARE_PROVIDER_SITE_OTHER): Payer: Managed Care, Other (non HMO) | Admitting: Family Medicine

## 2018-07-22 VITALS — BP 112/80 | HR 116 | Temp 99.1°F | Ht 63.0 in | Wt 269.4 lb

## 2018-07-22 DIAGNOSIS — R200 Anesthesia of skin: Secondary | ICD-10-CM | POA: Insufficient documentation

## 2018-07-22 DIAGNOSIS — B3731 Acute candidiasis of vulva and vagina: Secondary | ICD-10-CM

## 2018-07-22 DIAGNOSIS — E1165 Type 2 diabetes mellitus with hyperglycemia: Secondary | ICD-10-CM

## 2018-07-22 DIAGNOSIS — R29898 Other symptoms and signs involving the musculoskeletal system: Secondary | ICD-10-CM | POA: Diagnosis not present

## 2018-07-22 DIAGNOSIS — B353 Tinea pedis: Secondary | ICD-10-CM | POA: Insufficient documentation

## 2018-07-22 DIAGNOSIS — E1140 Type 2 diabetes mellitus with diabetic neuropathy, unspecified: Secondary | ICD-10-CM

## 2018-07-22 DIAGNOSIS — M5410 Radiculopathy, site unspecified: Secondary | ICD-10-CM

## 2018-07-22 DIAGNOSIS — B373 Candidiasis of vulva and vagina: Secondary | ICD-10-CM | POA: Insufficient documentation

## 2018-07-22 HISTORY — DX: Anesthesia of skin: R20.0

## 2018-07-22 MED ORDER — INSULIN GLARGINE 100 UNIT/ML SOLOSTAR PEN: 10.0000 [IU] | PEN_INJECTOR | Freq: Every day | SUBCUTANEOUS | 3 refills | Status: DC

## 2018-07-22 MED ORDER — INSULIN GLARGINE 100 UNIT/ML SOLOSTAR PEN: 20.0000 [IU] | PEN_INJECTOR | Freq: Every day | SUBCUTANEOUS | 3 refills | Status: DC

## 2018-07-22 MED ORDER — TERBINAFINE HCL 1 % EX CREA: 1.0000 "application " | TOPICAL_CREAM | Freq: Two times a day (BID) | CUTANEOUS | 0 refills | Status: DC

## 2018-07-22 MED ORDER — DULAGLUTIDE 1.5 MG/0.5ML ~~LOC~~ SOAJ: 1.5000 mg | SUBCUTANEOUS | 0 refills | Status: DC

## 2018-07-22 MED ORDER — FLUCONAZOLE 150 MG PO TABS: ORAL_TABLET | ORAL | 0 refills | Status: DC

## 2018-07-22 NOTE — Assessment & Plan Note (Signed)
Unclear etiology however concerning for nerve root entrapment or injury given asymmetric reflexes and sensation noted along peroneal nerve distribution.  Low suspicion for cauda equina or conus medullaris syndromes given asymmetric findings and lack of bowel or bladder incontinence.  Will obtain lumbar MRI.  Follow-up in 2 weeks.

## 2018-07-22 NOTE — Assessment & Plan Note (Signed)
Will prescribe fluconazole.  Instructed to return to clinic for vaginal swab if no better after treatment.

## 2018-07-22 NOTE — Telephone Encounter (Signed)
Job Accomodation Request form dropped off for work at [employer] desk for completion.  Verified that patient section of form has been completed.  Last DOS/WCC with PCP was 04/14/2018.  Placed form in team folder to be completed by clinical staff.  Lisa Marshall

## 2018-07-22 NOTE — Assessment & Plan Note (Signed)
Noted on exam.  Will prescribe terbinafine cream.

## 2018-07-22 NOTE — Patient Instructions (Signed)
It was great to see you!  Our plans for today:  - We are obtaining an MRI of your low back to determine if you have any nerve compressions that are causing your symptoms. If you develop loss of control of your bowel or bladder or severe back pain, you should go to the ED - We sent in an anti-fungal cream for your foot. - We increased your Trulicity - We decreased your Lantus to 10units - Check your sugars and aim for no less than 125 morning sugars - Follow up in 2 weeks  Take care and seek immediate care sooner if you develop any concerns.   Dr. Mollie Germanyumball [facility]

## 2018-07-22 NOTE — Assessment & Plan Note (Addendum)
Has noted improvement with the addition of Trulicity, however has been unable to obtain Lantus due to insurance.  Will increase Trulicity dose and provide Lantus sample at 10 units daily.  Instructed to continue documenting CBGs and follow-up in 1 month.  Congratulated patient on weight loss efforts.  Foot exam obtained today however abnormal given concern for nerve injury, see above.

## 2018-07-25 ENCOUNTER — Ambulatory Visit (HOSPITAL_COMMUNITY)
Admission: EM | Admit: 2018-07-25 | Discharge: 2018-07-25 | Disposition: A | Discharge status: Home / Self Care | Payer: Managed Care, Other (non HMO)

## 2018-07-25 ENCOUNTER — Encounter (HOSPITAL_COMMUNITY): Payer: Self-pay

## 2018-07-25 ENCOUNTER — Emergency Department (HOSPITAL_COMMUNITY)
Admission: EM | Admit: 2018-07-25 | Discharge: 2018-07-25 | Disposition: A | Discharge status: Home / Self Care | Payer: Managed Care, Other (non HMO) | Attending: Emergency Medicine | Admitting: Emergency Medicine

## 2018-07-25 ENCOUNTER — Other Ambulatory Visit: Payer: Self-pay

## 2018-07-25 ENCOUNTER — Emergency Department (HOSPITAL_COMMUNITY): Payer: Managed Care, Other (non HMO)

## 2018-07-25 DIAGNOSIS — K625 Hemorrhage of anus and rectum: Secondary | ICD-10-CM

## 2018-07-25 DIAGNOSIS — R202 Paresthesia of skin: Secondary | ICD-10-CM

## 2018-07-25 DIAGNOSIS — M549 Dorsalgia, unspecified: Secondary | ICD-10-CM | POA: Insufficient documentation

## 2018-07-25 DIAGNOSIS — Z794 Long term (current) use of insulin: Secondary | ICD-10-CM | POA: Insufficient documentation

## 2018-07-25 DIAGNOSIS — J45909 Unspecified asthma, uncomplicated: Secondary | ICD-10-CM | POA: Insufficient documentation

## 2018-07-25 DIAGNOSIS — R109 Unspecified abdominal pain: Secondary | ICD-10-CM | POA: Diagnosis not present

## 2018-07-25 DIAGNOSIS — E119 Type 2 diabetes mellitus without complications: Secondary | ICD-10-CM | POA: Diagnosis not present

## 2018-07-25 DIAGNOSIS — R200 Anesthesia of skin: Secondary | ICD-10-CM | POA: Insufficient documentation

## 2018-07-25 LAB — ABO/RH: ABO/RH(D): O POS

## 2018-07-25 LAB — CBC WITH DIFFERENTIAL/PLATELET
ABS IMMATURE GRANULOCYTES: 0.01 10*3/uL (ref 0.00–0.07)
BASOS ABS: 0 10*3/uL (ref 0.0–0.1)
Basophils Relative: 0 %
Eosinophils Absolute: 0 10*3/uL (ref 0.0–0.5)
Eosinophils Relative: 1 %
HEMATOCRIT: 45.7 % (ref 36.0–46.0)
HEMOGLOBIN: 14.8 g/dL (ref 12.0–15.0)
IMMATURE GRANULOCYTES: 0 %
LYMPHS ABS: 1.9 10*3/uL (ref 0.7–4.0)
LYMPHS PCT: 38 %
MCH: 28.1 pg (ref 26.0–34.0)
MCHC: 32.4 g/dL (ref 30.0–36.0)
MCV: 86.9 fL (ref 80.0–100.0)
Monocytes Absolute: 0.4 10*3/uL (ref 0.1–1.0)
Monocytes Relative: 9 %
NEUTROS ABS: 2.6 10*3/uL (ref 1.7–7.7)
NEUTROS PCT: 52 %
NRBC: 0 % (ref 0.0–0.2)
Platelets: 334 10*3/uL (ref 150–400)
RBC: 5.26 MIL/uL — ABNORMAL HIGH (ref 3.87–5.11)
RDW: 11.7 % (ref 11.5–15.5)
WBC: 5 10*3/uL (ref 4.0–10.5)

## 2018-07-25 LAB — BASIC METABOLIC PANEL
ANION GAP: 11 (ref 5–15)
BUN: 11 mg/dL (ref 6–20)
CALCIUM: 9.4 mg/dL (ref 8.9–10.3)
CO2: 23 mmol/L (ref 22–32)
CREATININE: 0.6 mg/dL (ref 0.44–1.00)
Chloride: 100 mmol/L (ref 98–111)
Glucose, Bld: 404 mg/dL — ABNORMAL HIGH (ref 70–99)
Potassium: 4.1 mmol/L (ref 3.5–5.1)
SODIUM: 134 mmol/L — AB (ref 135–145)

## 2018-07-25 LAB — CBG MONITORING, ED: Glucose-Capillary: 277 mg/dL — ABNORMAL HIGH (ref 70–99)

## 2018-07-25 LAB — POC OCCULT BLOOD, ED: Fecal Occult Bld: POSITIVE — AB

## 2018-07-25 LAB — TYPE AND SCREEN
ABO/RH(D): O POS
ANTIBODY SCREEN: NEGATIVE

## 2018-07-25 LAB — I-STAT BETA HCG BLOOD, ED (MC, WL, AP ONLY): I-stat hCG, quantitative: <5 m[IU]/mL (ref <5)

## 2018-07-25 MED ORDER — INSULIN ASPART 100 UNIT/ML ~~LOC~~ SOLN
10.0000 [IU] | Freq: Once | SUBCUTANEOUS | Status: AC
  Administered 2018-07-25: 10 [IU] via INTRAVENOUS
  Filled 2018-07-25: qty 1

## 2018-07-25 NOTE — ED Notes (Signed)
Patient transported to X-ray 

## 2018-07-25 NOTE — ED Triage Notes (Signed)
She c/o paresthesias of bilat. Legs, and is scheduled by her pcp/[facility], to have MRI 08-03-2018. She is here today with c/o persistent paresthesias of lower extremities, and "When I went to the bathroom this morning I saw a little blood in it". She denies abd. Pain.

## 2018-07-25 NOTE — ED Provider Notes (Signed)
[facility]-EMERGENCY DEPT Provider Note   CSN: [id number] Arrival date & time: 07/25/18  1109   History   Chief Complaint Chief Complaint  Patient presents with  . Numbness  . Rectal Bleeding    HPI Lisa Marshall is a 22 y.o. female who presents with bilateral leg numbness and rectal bleeding. PMH significant for asthma, obesity, insulin dependent DM. She states that she has chronic neuropathy from diabetes in her bilateral feet and bilateral hands. She takes Gabapentin and this has improved the neuropathy in her hands but she has worsening numbness in her legs. She went to UC and was given Norco and Lasix. This did not help. She went to orthopedics and was told that this was not an orthopedic issue and sounded more medical. She followed up with her PCP and they ordered a MRI for her back. Usually it's just around her toes but now it's going up to her calves. She denies any leg weakness, saddle anesthesia, or difficulty walking. She is having some low back pain which started three days ago.  Additionally she reports rectal bleeding starting today. She has never had this before. She denies abdominal pain or constipation. She has loose stools from taking Metformin. When she went to the bathroom today and it was like "having a period" in the toilet and when she wiped. Nothing makes it better or worse. She denies rectal pain.   HPI  Past Medical History:  Diagnosis Date  . Asthma   . Depression   . Encounter for surveillance of injectable contraceptive 12/23/2014  . History of PID   . Paronychia of great toe of left foot 10/03/2017  . Pregnancy, multiple   . Syncope     Patient Active Problem List   Diagnosis Date Noted  . Numbness of foot 07/22/2018  . Tinea pedis, right 07/22/2018  . Yeast infection involving the vagina and surrounding area 07/22/2018  . Arthralgia of both lower legs 07/07/2018  . Candidal intertrigo 03/03/2018  . Hypertriglyceridemia  05/30/2017  . Vaginal bleeding 05/30/2017  . Poorly controlled type 2 diabetes mellitus with neuropathy (HCC) 05/23/2017  . Amenorrhea 12/08/2015  . Episodic mood disorder (HCC) 06/21/2013  . Morbid obesity (HCC) 11/06/2007  . GERD 11/06/2007  . ASTHMA, INTERMITTENT 10/23/2006    Past Surgical History:  Procedure Laterality Date  . ADENOIDECTOMY    . CESAREAN SECTION N/A 11/18/2013   Procedure: Primary Cesarean Section Delivery Baby "A" Girl @ 0447, Apgars 1/2/4, Baby "B" @ 0450, Apgars 8/9;  Surgeon: Kathreen CosierBernard A Marshall, MD;  Location: [facility];  Service: Obstetrics;  Laterality: N/A;  . TONSILLECTOMY       OB History    Gravida  1   Para  1   Term  1   Preterm  0   AB  0   Living  2     SAB  0   TAB  0   Ectopic  0   Multiple  1   Live Births  2            Home Medications    Prior to Admission medications   Medication Sig Start Date End Date Taking? Authorizing Provider  albuterol (PROVENTIL HFA;VENTOLIN HFA) 108 (90 Base) MCG/ACT inhaler Inhale into the lungs every 6 (six) hours as needed for wheezing or shortness of breath.   Yes [provider]  Ascorbic Acid (VITAMIN C PO) Take 1 tablet by mouth daily.   Yes [provider]  cyclobenzaprine (FLEXERIL) 5 MG tablet Take 5 mg by mouth 2 (two) times daily as needed for muscle spasms.    Yes [provider]  Dulaglutide (TRULICITY) 1.5 MG/0.5ML SOPN Inject 1.5 mg into the skin once a week. 07/22/18  Yes Ellwood Dense, DO  gabapentin (NEURONTIN) 300 MG capsule Take 1 capsule (300 mg total) by mouth 3 (three) times daily. 05/30/17  Yes Doreene Eland, MD  glipiZIDE (GLUCOTROL) 10 MG tablet Take 1 tablet (10 mg total) by mouth daily. 03/03/18  Yes Doreene Eland, MD  HYDROcodone-acetaminophen (NORCO/VICODIN) 5-325 MG tablet Take 2 tablets by mouth every 4 (four) hours as needed. 07/03/18  Yes Cheron Schaumann K, [name]  ibuprofen (ADVIL,MOTRIN) 800 MG tablet Take 1 tablet (800 mg total)  by mouth 3 (three) times daily. 04/17/18  Yes Eustace Moore, MD  Insulin Glargine (LANTUS SOLOSTAR) 100 UNIT/ML Solostar Pen Inject 10 Units into the skin daily. 07/22/18  Yes Ellwood Dense, DO  MedroxyPROGESTERone Acetate (DEPO-PROVERA IM) Inject into the muscle every 3 (three) months. Last injection 1st part of August 2018   Yes [provider]  metFORMIN (GLUCOPHAGE) 500 MG tablet TAKE 2 TABLETS BY MOUTH TWICE DAILY WITH FOOD Patient taking differently: Take 1,000 mg by mouth 2 (two) times daily with a meal.  03/18/18  Yes Doreene Eland, MD  nystatin (NYSTATIN) powder Apply to breast creases when you have rash twice a day 07/07/18  Yes Riccio, Angela C, DO  Omega-3 Fatty Acids (FISH OIL) 1000 MG CAPS Take 1,000 capsules by mouth 2 (two) times daily.    Yes [provider]  BD PEN NEEDLE NANO U/F 32G X 4 MM MISC INJECT 10 UNITS DAILY AT 10PM 11/17/17   Moses Manners, MD  fluconazole (DIFLUCAN) 150 MG tablet TAKE 1 TABLET BY MOUTH AS ONE DOSE 07/22/18   Ellwood Dense, DO  naproxen (NAPROSYN) 500 MG tablet Take 1 tablet (500 mg total) by mouth 2 (two) times daily. Patient not taking: Reported on 07/25/2018 06/09/18   Dahlia Byes A, NP  terbinafine (LAMISIL) 1 % cream Apply 1 application topically 2 (two) times daily. 07/22/18   Ellwood Dense, DO    Family History Family History  Problem Relation Age of Onset  . Asthma Other   . Diabetes Other   . Cancer Other   . Sickle cell anemia Mother     Social History Social History   Tobacco Use  . Smoking status: Never Smoker  . Smokeless tobacco: Never Used  Substance Use Topics  . Alcohol use: No  . Drug use: No     Allergies   Penicillins   Review of Systems Review of Systems  Constitutional: Negative for fever.  Respiratory: Negative for shortness of breath.   Cardiovascular: Negative for chest pain.  Gastrointestinal: Positive for abdominal pain and blood in stool. Negative for constipation,  nausea, rectal pain and vomiting.  Genitourinary: Negative for dysuria.  Musculoskeletal: Positive for back pain. Negative for gait problem.  Neurological: Positive for numbness.  All other systems reviewed and are negative.    Physical Exam Updated Vital Signs BP 131/81 (BP Location: Left Arm)   Pulse 94   Temp 98.8 F (37.1 C) (Oral)   Resp 16   LMP 06/30/2018 (Exact Date)   SpO2 100%   Physical Exam  Constitutional: She is oriented to person, place, and time. She appears well-developed and well-nourished. No distress.  Calm and cooperative. Obese. Ambulatory  HENT:  Head: Normocephalic and atraumatic.  Eyes: Pupils are equal, round, and reactive to light. Conjunctivae are normal. Right eye exhibits no discharge. Left eye exhibits no discharge. No scleral icterus.  Neck: Normal range of motion.  Cardiovascular: Normal rate and regular rhythm.  Pulmonary/Chest: Effort normal and breath sounds normal. No respiratory distress.  Abdominal: Soft. Bowel sounds are normal. She exhibits no distension. There is no tenderness.  Genitourinary:  Genitourinary Comments: Rectal: Small amount of skin breakdown around the anus without bleeding. No gross blood with digital exam. No hemorrhoids, fissures, redness, area of fluctuance, lesions, or tenderness. Chaperone present during exam.  Musculoskeletal:  Back: Inspection: No masses, deformity, or rash Palpation: Lower lumbar tenderness. Strength: 5/5 in lower extremities and normal plantar and dorsiflexion Sensation: Subjective numbness of the circumferential bilateral calves and dorsal aspect of feet. Pt states she has normal sensation of toes. Reflexes: Patellar reflex is 1+ bilaterally SLR: Negative seated straight leg raise Gait: Normal gait  Neurological: She is alert and oriented to person, place, and time. A sensory deficit is present.  Skin: Skin is warm and dry.  Psychiatric: She has a normal mood and affect. Her behavior is  normal.  Nursing note and vitals reviewed.    ED Treatments / Results  Labs (all labs ordered are listed, but only abnormal results are displayed) Labs Reviewed  CBC WITH DIFFERENTIAL/PLATELET - Abnormal; Notable for the following components:      Result Value   RBC 5.26 (*)    All other components within normal limits  BASIC METABOLIC PANEL - Abnormal; Notable for the following components:   Sodium 134 (*)    Glucose, Bld 404 (*)    All other components within normal limits  POC OCCULT BLOOD, ED - Abnormal; Notable for the following components:   Fecal Occult Bld POSITIVE (*)    All other components within normal limits  CBG MONITORING, ED - Abnormal; Notable for the following components:   Glucose-Capillary 277 (*)    All other components within normal limits  I-STAT BETA HCG BLOOD, ED ([facility], [facility], [facility] ONLY)  TYPE AND SCREEN  ABO/RH    EKG None  Radiology Dg Lumbar Spine Complete  Result Date: 07/25/2018 CLINICAL DATA:  Bilateral lower extremity paresthesias. EXAM: LUMBAR SPINE - COMPLETE 4+ VIEW COMPARISON:  01/01/2018 FINDINGS: There is no evidence of lumbar spine fracture. Alignment is normal. Intervertebral disc spaces are maintained. IMPRESSION: Negative. Electronically Signed   By: Elberta Fortis M.D.   On: 07/25/2018 17:05    Procedures Procedures (including critical care time)  Medications Ordered in ED Medications  insulin aspart (novoLOG) injection 10 Units (10 Units Intravenous Given 07/25/18 1607)     Initial Impression / Assessment and Plan / ED Course  I have reviewed the triage vital signs and the nursing notes.  Pertinent labs & imaging results that were available during my care of the patient were reviewed by me and considered in my medical decision making (see chart for details).  22 year old female with numbness of bilateral legs over the past day. This is a chronic problem which is worse than normal. She does not have any red flags in history or on  exam. No leg weakness, saddle anesthesia. She is ambulatory. Normal rectal tone and intact reflexes. Do not think she needs an emergent MRI today and she is comfortable with this. Lumbar xray was repeated and is normal.  Regarding rectal bleeding, unclear etiology. She has never had this before. Amt of blood described seems to be minimal.  She does not have gross blood on rectal exam although fecal occult is positive. Hgb is normal. Her glucose was 400 and she was given insulin which improved to 277. She was given reassurance. She may possibly have an internal hemorrhoid which was not palpated. Will d/c with PCP f/u.  Final Clinical Impressions(s) / ED Diagnoses   Final diagnoses:  Rectal bleeding  Paresthesia    ED Discharge Orders    None       Bethel Born, [name] 07/25/18 1901    Maia Plan, MD 07/25/18 2108

## 2018-07-25 NOTE — ED Triage Notes (Signed)
Ambulating without difficulty.  Per pt, has MRI scheduled 12/9 for back pain and feet tingling.  Today states having "loss of feeling" in BLE extending up into leg; also states had significant amount of bright red blood with bowel movement this AM without hx hemorrhoids.  Discussed with Despina Arias. Bast, NP - instructed pt to go to ED.  Pt verbalized understanding.

## 2018-07-25 NOTE — Discharge Instructions (Signed)
Please follow up with your doctor and have MRI completed next week Return if you are worsening

## 2018-07-27 ENCOUNTER — Other Ambulatory Visit: Payer: Self-pay | Admitting: Family Medicine

## 2018-07-27 DIAGNOSIS — E669 Obesity, unspecified: Secondary | ICD-10-CM

## 2018-07-27 DIAGNOSIS — J45909 Unspecified asthma, uncomplicated: Secondary | ICD-10-CM

## 2018-07-27 DIAGNOSIS — E1169 Type 2 diabetes mellitus with other specified complication: Secondary | ICD-10-CM

## 2018-07-27 NOTE — Telephone Encounter (Signed)
Please advise patient that this form should be completed by an occupational physician. I have placed referral for her. Form dropped in the RN's inbox.

## 2018-07-27 NOTE — Telephone Encounter (Signed)
Will forward to rn team.  Lisa Marshall,CMA  

## 2018-07-27 NOTE — Progress Notes (Signed)
Form Completion    You  [facility] 3 minutes ago (1:24 PM)      Please advise patient that this form should be completed by an occupational physician. I have placed referral for her. Form dropped in the RN's inbox.      Documentation     [name], [name] M, CMA  You 1 hour ago (11:58 AM)      Clinical info completed on work form.  Place form in Dr. Phebe CollaEniola's box for completion.  Feliz BeamHARTSELL,  [name], CMA      Documentation     Magtoto, Herma MeringDale Danielle S routed conversation to [facility] 5 days ago    [employer], Herma MeringDale Danielle S 5 days ago      Job Accomodation Request form dropped off for work at [employer] desk for completion.  Verified that patient section of form has been completed.  Last DOS/WCC with PCP was 04/14/2018.  Placed form in team folder to be completed by clinical staff.  Herma Meringale Danielle S Magtoto         Documentation

## 2018-07-27 NOTE — Telephone Encounter (Signed)
Clinical info completed on work form.  Place form in Dr. Phebe CollaEniola's box for completion.  Feliz BeamHARTSELL,  , CMA

## 2018-07-27 NOTE — Telephone Encounter (Signed)
I placed form up front for patient to pick up.

## 2018-07-27 NOTE — Telephone Encounter (Signed)
Attempted to contact pt to inform, however no answer and VM full.

## 2018-07-31 ENCOUNTER — Telehealth: Payer: Self-pay | Admitting: Family Medicine

## 2018-08-03 ENCOUNTER — Ambulatory Visit (HOSPITAL_COMMUNITY): Payer: Managed Care, Other (non HMO)

## 2018-08-04 ENCOUNTER — Encounter: Payer: Self-pay | Admitting: Family Medicine

## 2018-08-05 ENCOUNTER — Ambulatory Visit (INDEPENDENT_AMBULATORY_CARE_PROVIDER_SITE_OTHER): Payer: Managed Care, Other (non HMO) | Admitting: Family Medicine

## 2018-08-05 ENCOUNTER — Encounter: Payer: Self-pay | Admitting: Family Medicine

## 2018-08-05 ENCOUNTER — Other Ambulatory Visit: Payer: Self-pay

## 2018-08-05 VITALS — BP 124/82 | HR 101 | Temp 98.6°F | Ht 63.0 in | Wt 267.0 lb

## 2018-08-05 DIAGNOSIS — G629 Polyneuropathy, unspecified: Secondary | ICD-10-CM | POA: Diagnosis not present

## 2018-08-05 DIAGNOSIS — E1165 Type 2 diabetes mellitus with hyperglycemia: Secondary | ICD-10-CM

## 2018-08-05 DIAGNOSIS — E785 Hyperlipidemia, unspecified: Secondary | ICD-10-CM

## 2018-08-05 DIAGNOSIS — Z23 Encounter for immunization: Secondary | ICD-10-CM

## 2018-08-05 DIAGNOSIS — E1140 Type 2 diabetes mellitus with diabetic neuropathy, unspecified: Secondary | ICD-10-CM

## 2018-08-05 DIAGNOSIS — K625 Hemorrhage of anus and rectum: Secondary | ICD-10-CM | POA: Diagnosis not present

## 2018-08-05 DIAGNOSIS — E781 Pure hyperglyceridemia: Secondary | ICD-10-CM

## 2018-08-05 LAB — POCT UA - MICROALBUMIN
Creatinine, POC: 50 mg/dL
MICROALBUMIN (UR) POC: 10 mg/L

## 2018-08-05 NOTE — Assessment & Plan Note (Addendum)
Going in the right directly. She is also working on diet control for weight loss. She lost 2 lbs since Nov which is good. Continue current regimen. Return in 2 months for A1C.  DM foot exam completed today. Her tinea pedis is gradually resolving. Per patient she had a recent eye exam. She will obtain report for us.  Tight glycemic control for neuropathy. TSH, B12 checked as well.

## 2018-08-05 NOTE — Assessment & Plan Note (Signed)
Checked lipid profile (non-fasting) today. I will call with result.

## 2018-08-05 NOTE — Patient Instructions (Signed)

## 2018-08-05 NOTE — Assessment & Plan Note (Signed)
ED report reviewed. +FOBT Likely hemorrhoid. Increase fiber diet. GI referral done today.

## 2018-08-05 NOTE — Progress Notes (Signed)
Subjective:    Patient ID: Lisa Marshall, female    DOB: 20-Dec-1995, 22 y.o.   MRN: [id number]  HPI DM2:  Here for f/u. She is currently compliant with Trulicity 1.5 units Qweek,Glipizide 10 mg qd Lantus 10 units Qd and Metformin 1000 mg BID. She also started fasting 3 days a week since after thanksgiving, she will drink plenty of water and cut back on her meal 3 times a week.Here home CBG has improved a lot, CBG 225 max at home in the last few weeks and lowest of 118. She denies hypoglycemic symptoms. Neuropathy; Hand burning pain improved on Gabapentin, she still has lots of pain in her feet with little response to Gabapentin. HLD: On fish oil. Rectal Bleed:Had gross blood per rectum few days ago for which she went to the ED. She endorsed associated low back pain. She had felt better since then.   Current Outpatient Medications on File Prior to Visit  Medication Sig Dispense Refill  . Ascorbic Acid (VITAMIN C PO) Take 1 tablet by mouth daily.    . Dulaglutide (TRULICITY) 1.5 MG/0.5ML SOPN Inject 1.5 mg into the skin once a week. 4 pen 0  . gabapentin (NEURONTIN) 300 MG capsule Take 1 capsule (300 mg total) by mouth 3 (three) times daily. 90 capsule 3  . glipiZIDE (GLUCOTROL) 10 MG tablet Take 1 tablet (10 mg total) by mouth daily. 30 tablet 3  . Insulin Glargine (LANTUS SOLOSTAR) 100 UNIT/ML Solostar Pen Inject 10 Units into the skin daily. 15 pen 3  . metFORMIN (GLUCOPHAGE) 500 MG tablet TAKE 2 TABLETS BY MOUTH TWICE DAILY WITH FOOD (Patient taking differently: Take 1,000 mg by mouth 2 (two) times daily with a meal. ) 180 tablet 2  . Omega-3 Fatty Acids (FISH OIL) 1000 MG CAPS Take 1,000 capsules by mouth 2 (two) times daily.     Marland Kitchen terbinafine (LAMISIL) 1 % cream Apply 1 application topically 2 (two) times daily. 30 g 0  . albuterol (PROVENTIL HFA;VENTOLIN HFA) 108 (90 Base) MCG/ACT inhaler Inhale into the lungs every 6 (six) hours as needed for wheezing or shortness of breath.    . BD  PEN NEEDLE NANO U/F 32G X 4 MM MISC INJECT 10 UNITS DAILY AT 10PM 100 each 3  . cyclobenzaprine (FLEXERIL) 5 MG tablet Take 5 mg by mouth 2 (two) times daily as needed for muscle spasms.     Marland Kitchen ibuprofen (ADVIL,MOTRIN) 800 MG tablet Take 1 tablet (800 mg total) by mouth 3 (three) times daily. (Patient not taking: Reported on 08/05/2018) 30 tablet 0  . MedroxyPROGESTERone Acetate (DEPO-PROVERA IM) Inject into the muscle every 3 (three) months. Last injection 1st part of August 2018    . naproxen (NAPROSYN) 500 MG tablet Take 1 tablet (500 mg total) by mouth 2 (two) times daily. (Patient not taking: Reported on 07/25/2018) 30 tablet 0  . nystatin (NYSTATIN) powder Apply to breast creases when you have rash twice a day (Patient not taking: Reported on 08/05/2018) 60 g 0   No current facility-administered medications on file prior to visit.    Past Medical History:  Diagnosis Date  . Asthma   . Depression   . Encounter for surveillance of injectable contraceptive 12/23/2014  . History of PID   . Paronychia of great toe of left foot 10/03/2017  . Pregnancy, multiple   . Syncope   . Vaginal bleeding 05/30/2017   Vitals:   08/05/18 0849  BP: 124/82  Pulse: (!) 101  Temp: 98.6 F (37 C)  TempSrc: Oral  SpO2: 98%  Weight: 267 lb (121.1 kg)  Height: 5\' 3"  (1.6 m)    Review of Systems  Respiratory: Negative.   Cardiovascular: Negative.   Gastrointestinal: Negative.   Musculoskeletal: Negative.   Neurological: Negative for seizures, weakness and numbness.       Burning pain hands and feet.  All other systems reviewed and are negative.      Objective:   Physical Exam  Constitutional: She is oriented to person, place, and time. She appears well-developed. No distress.  Cardiovascular: Normal rate, regular rhythm and normal heart sounds. Exam reveals no friction rub.  No murmur heard. Pulmonary/Chest: Effort normal and breath sounds normal. No stridor. No respiratory distress. She has no  wheezes.  Abdominal: Soft. Bowel sounds are normal. She exhibits no distension. There is no tenderness. There is no guarding.  Musculoskeletal: Normal range of motion. She exhibits no edema.  Sensory exam of the foot is normal, tested with the monofilament. Good pulses, no lesions or ulcers, good peripheral pulses.  Small white discoloration between her right 4th and 5th interdigital web.   Neurological: She is oriented to person, place, and time. She has normal strength. No cranial nerve deficit or sensory deficit. Coordination normal.  Nursing note and vitals reviewed.   Assessment & Plan:  Poorly controlled DM HLD Rectal Bleed Neuropathy Check problem list

## 2018-08-06 ENCOUNTER — Telehealth: Payer: Self-pay | Admitting: Family Medicine

## 2018-08-06 LAB — LIPID PANEL
CHOL/HDL RATIO: 4.3 ratio (ref 0.0–4.4)
Cholesterol, Total: 154 mg/dL (ref 100–199)
HDL: 36 mg/dL — ABNORMAL LOW (ref >39)
LDL Calculated: 99 mg/dL (ref 0–99)
Triglycerides: 95 mg/dL (ref 0–149)
VLDL CHOLESTEROL CAL: 19 mg/dL (ref 5–40)

## 2018-08-06 LAB — MICROALBUMIN / CREATININE URINE RATIO
CREATININE, UR: 63.7 mg/dL
MICROALBUM., U, RANDOM: 3.2 ug/mL
Microalb/Creat Ratio: 5 mg/g creat (ref 0.0–30.0)

## 2018-08-06 LAB — VITAMIN B12: VITAMIN B 12: 232 pg/mL (ref 232–1245)

## 2018-08-06 LAB — TSH: TSH: 2.71 u[IU]/mL (ref 0.450–4.500)

## 2018-08-06 NOTE — Telephone Encounter (Signed)
Lab result discussed with her. Urine Micro pending.  All questions were answered.

## 2018-08-21 ENCOUNTER — Telehealth: Payer: Self-pay | Admitting: Family Medicine

## 2018-08-21 NOTE — Telephone Encounter (Signed)
See note from Dr. Lum BabeEniola.  Keyuana Wank,CMA

## 2018-08-21 NOTE — Telephone Encounter (Signed)
I got a disability form completion request. Per documentation, she does not meet the requirement for short term disability. Perhaps what she want to apply for is intermittent disability.   She said she already has intermittent disability. However, she is missing more work days due to her DM neuropathy.   I asked her to come in to talk more about this.  She also asked about an MRI. Looks like this was ordered by Dr. Luanne Brasumbal. I will have the blue team CMA help her with scheduling.  Form returned to the front office for her to pick up.

## 2018-08-21 NOTE — Telephone Encounter (Signed)
Spoke with radiology to schedule MRI but it was rescheduled due to not being authorized.  Will forward to referral coordinator for authorization and then I will call to get this scheduled.  Audra Kagel,CMA

## 2018-08-21 NOTE — Telephone Encounter (Signed)
Patient dropped of paperwork to be filled out .Please fax to number on forms and call when completed.

## 2018-08-21 NOTE — Telephone Encounter (Signed)
Patient left paperwork to be completed by provider for short term disability.  Her ZOX#[id number] name is Lisa Marshall.  Also would like phone call when forms are completed.  Forms are to be faxed information on forms  Fax#[phone].

## 2018-08-28 ENCOUNTER — Other Ambulatory Visit: Payer: Self-pay | Admitting: Family Medicine

## 2018-08-28 ENCOUNTER — Telehealth: Payer: Self-pay | Admitting: Family Medicine

## 2018-08-28 MED ORDER — GABAPENTIN 400 MG PO CAPS: 400.0000 mg | ORAL_CAPSULE | Freq: Three times a day (TID) | ORAL | 2 refills | Status: DC

## 2018-08-28 NOTE — Telephone Encounter (Signed)
Called patient regarding paperwork.  The paperwork is both FMLA paperwork and a return to work request from [employer].  The patient reports she has had severe debilitating diabetic neuropathy.  Her A1c has been greater than 12 for some time by my chart review.  She reports she has difficulty ambulating due to diabetic neuropathy.  She works 4 days/week. Since November she has missed 3 of the 4 days of the work week every single week due to various pains.  I discussed that her diabetic neuropathy has been evaluated once during that timeframe and this was in November.  We discussed that I could not assess her disability easily via telephone.  I told her I would fill out the forms to my best of my ability.  I completed the forms which  document that she has type 2 diabetes with neuropathy.  Her diabetes and neuropathy will require follow-up visits every 2 to 3 months.  This is documented in the forms. This limits her ability to ambulate sometimes.  This should not limit her ability to go to work.  I completed the form stating the above facts.  Form placed under letter I at front desk for pick up.   Terisa Starr, MD  Family Medicine Teaching Service

## 2018-08-28 NOTE — Telephone Encounter (Signed)
Will check with referral coordinator to see if this has been authorized before I scheduled the appointment for her MRI.  Reynol Arnone,CMA

## 2018-08-28 NOTE — Telephone Encounter (Signed)
Patient called to see if her form was faxed for her.  It looks like in the chart that some things need to be explained to the patient about the form and MRI.  Please call patient today to discuss.  Thank you.

## 2018-08-28 NOTE — Telephone Encounter (Signed)
Patient states that her paperwork has to be returned by 08-31-18 or else she can lose her job. Patient has an appointment on 09-01-18 with PCP but would like to have the paperwork faxed back with something on it even it if says that it will be discussed in more detail at a later date.  Patient also states that she now has [employer] and will wait until she gets new cards before we schedule the MRI.  Patient also mentioned that when she spoke with provider last that she was going to send in some medication for her leg pain.  Patient never received anything and has since run out and is unable to wait until next appointment to discuss this.  She states that provider was supposed to call in medication at that time for her.  Will forward to MD and patient would like a call back if this can be completed today.  [name] [name],CMA

## 2018-08-28 NOTE — Telephone Encounter (Signed)
I didn't tell her I was going to call in a different medication for her neuropathic pain. I informed her we might need to go up on her Gabapentin. I will send an increase dose to her pharmacy. Please let her know.

## 2018-08-28 NOTE — Telephone Encounter (Signed)
Dr. Manson Passey is covering for me. Please have her help with form. Thanks.

## 2018-08-28 NOTE — Telephone Encounter (Signed)
Will forward to Dr. Brown.  [name] [name],CMA  

## 2018-08-31 NOTE — Telephone Encounter (Signed)
Tried to call patient but number wouldn't ring.  Will try again before lunch.  Lisa Marshall,CMA

## 2018-09-01 ENCOUNTER — Other Ambulatory Visit: Payer: Self-pay

## 2018-09-01 ENCOUNTER — Ambulatory Visit: Payer: Managed Care, Other (non HMO) | Admitting: Licensed Clinical Social Worker

## 2018-09-01 ENCOUNTER — Encounter: Payer: Self-pay | Admitting: Family Medicine

## 2018-09-01 ENCOUNTER — Ambulatory Visit (INDEPENDENT_AMBULATORY_CARE_PROVIDER_SITE_OTHER): Payer: BLUE CROSS/BLUE SHIELD | Admitting: Family Medicine

## 2018-09-01 VITALS — BP 128/84 | Temp 98.5°F | Wt 265.0 lb

## 2018-09-01 DIAGNOSIS — F39 Unspecified mood [affective] disorder: Secondary | ICD-10-CM | POA: Diagnosis not present

## 2018-09-01 DIAGNOSIS — F419 Anxiety disorder, unspecified: Secondary | ICD-10-CM

## 2018-09-01 DIAGNOSIS — E1140 Type 2 diabetes mellitus with diabetic neuropathy, unspecified: Secondary | ICD-10-CM

## 2018-09-01 DIAGNOSIS — F329 Major depressive disorder, single episode, unspecified: Secondary | ICD-10-CM

## 2018-09-01 DIAGNOSIS — E1165 Type 2 diabetes mellitus with hyperglycemia: Secondary | ICD-10-CM | POA: Diagnosis not present

## 2018-09-01 LAB — GLUCOSE, POCT (MANUAL RESULT ENTRY): POC Glucose: 340 mg/dl — AB (ref 70–99)

## 2018-09-01 MED ORDER — ALBUTEROL SULFATE HFA 108 (90 BASE) MCG/ACT IN AERS: 2.0000 | INHALATION_SPRAY | Freq: Four times a day (QID) | RESPIRATORY_TRACT | 2 refills | Status: AC | PRN

## 2018-09-01 MED ORDER — METFORMIN HCL 500 MG PO TABS: ORAL_TABLET | ORAL | 2 refills | Status: DC

## 2018-09-01 MED ORDER — DULAGLUTIDE 1.5 MG/0.5ML ~~LOC~~ SOAJ: 1.5000 mg | SUBCUTANEOUS | 1 refills | Status: DC

## 2018-09-01 MED ORDER — DULOXETINE HCL 20 MG PO CPEP: 20.0000 mg | ORAL_CAPSULE | Freq: Two times a day (BID) | ORAL | 1 refills | Status: DC

## 2018-09-01 MED ORDER — INSULIN GLARGINE 100 UNIT/ML SOLOSTAR PEN: 10.0000 [IU] | PEN_INJECTOR | Freq: Every day | SUBCUTANEOUS | 3 refills | Status: DC

## 2018-09-01 MED ORDER — GLIPIZIDE 10 MG PO TABS: 10.0000 mg | ORAL_TABLET | Freq: Every day | ORAL | 1 refills | Status: DC

## 2018-09-01 NOTE — Assessment & Plan Note (Signed)
She has been binge eating due to depression and anxiety. Counseling done. She will work on diet and exercise. Monitor closely.

## 2018-09-01 NOTE — Progress Notes (Signed)
Subjective:     Patient ID: Lisa Marshall, female   DOB: Sep 20, 1995, 23 y.o.   MRN: [id number]  HPI DM2: Here for follow-up. She is compliant with all her meds. However, she did not take her insulin yesterday and she bing eat because she was depressed. Her home CBG had been fine whenever she takes her meds. Her neuropathic pain bothers her as well. She just started her higher dose of Gabapentin 400 mg TID. She is yet to notice any major improvement. Depression/Anxiety:C/O feeling down and anxious about her diabetes. She feels that her leg will get cut off. She is worrying more than usual. Patient stated that she was diagnosed with Bipolar 4 yrs ago but was never started on any medication. She did well with counseling and her symptoms resolved.  Current Outpatient Medications on File Prior to Visit  Medication Sig Dispense Refill  . Ascorbic Acid (VITAMIN C PO) Take 1 tablet by mouth daily.    . Dulaglutide (TRULICITY) 1.5 MG/0.5ML SOPN Inject 1.5 mg into the skin once a week. 4 pen 0  . gabapentin (NEURONTIN) 400 MG capsule Take 1 capsule (400 mg total) by mouth 3 (three) times daily. 90 capsule 2  . glipiZIDE (GLUCOTROL) 10 MG tablet Take 1 tablet (10 mg total) by mouth daily. 30 tablet 3  . Insulin Glargine (LANTUS SOLOSTAR) 100 UNIT/ML Solostar Pen Inject 10 Units into the skin daily. 15 pen 3  . MedroxyPROGESTERone Acetate (DEPO-PROVERA IM) Inject into the muscle every 3 (three) months. Last injection 1st part of August 2018    . metFORMIN (GLUCOPHAGE) 500 MG tablet TAKE 2 TABLETS BY MOUTH TWICE DAILY WITH FOOD (Patient taking differently: Take 1,000 mg by mouth 2 (two) times daily with a meal. ) 180 tablet 2  . Omega-3 Fatty Acids (FISH OIL) 1000 MG CAPS Take 1,000 capsules by mouth 2 (two) times daily.     Marland Kitchen albuterol (PROVENTIL HFA;VENTOLIN HFA) 108 (90 Base) MCG/ACT inhaler Inhale into the lungs every 6 (six) hours as needed for wheezing or shortness of breath.    . BD PEN NEEDLE NANO  U/F 32G X 4 MM MISC INJECT 10 UNITS DAILY AT 10PM 100 each 3  . ibuprofen (ADVIL,MOTRIN) 800 MG tablet Take 1 tablet (800 mg total) by mouth 3 (three) times daily. (Patient not taking: Reported on 08/05/2018) 30 tablet 0  . naproxen (NAPROSYN) 500 MG tablet Take 1 tablet (500 mg total) by mouth 2 (two) times daily. (Patient not taking: Reported on 07/25/2018) 30 tablet 0  . nystatin (NYSTATIN) powder Apply to breast creases when you have rash twice a day (Patient not taking: Reported on 08/05/2018) 60 g 0  . terbinafine (LAMISIL) 1 % cream Apply 1 application topically 2 (two) times daily. (Patient not taking: Reported on 09/01/2018) 30 g 0   No current facility-administered medications on file prior to visit.    Past Medical History:  Diagnosis Date  . Asthma   . Depression   . Encounter for surveillance of injectable contraceptive 12/23/2014  . History of PID   . Paronychia of great toe of left foot 10/03/2017  . Pregnancy, multiple   . Syncope   . Vaginal bleeding 05/30/2017   Vitals:   09/01/18 0914  BP: 128/84  Temp: 98.5 F (36.9 C)  TempSrc: Oral  Weight: 265 lb (120.2 kg)     Review of Systems  Respiratory: Negative.   Cardiovascular: Negative.   Gastrointestinal: Negative.   Musculoskeletal: Negative.   Neurological: Negative  for numbness.       Burning feet  Psychiatric/Behavioral: Positive for sleep disturbance. Negative for hallucinations, self-injury and suicidal ideas. The patient is nervous/anxious.   All other systems reviewed and are negative.      Objective:   Physical Exam Vitals signs and nursing note reviewed.  Constitutional:      Appearance: She is obese. She is not ill-appearing or diaphoretic.  Cardiovascular:     Rate and Rhythm: Normal rate and regular rhythm.     Heart sounds: Normal heart sounds. No murmur.  Pulmonary:     Effort: Pulmonary effort is normal. No respiratory distress.     Breath sounds: Normal breath sounds. No stridor. No  wheezing, rhonchi or rales.  Chest:     Chest wall: No tenderness.  Abdominal:     General: Abdomen is flat. Bowel sounds are normal. There is no distension.     Palpations: There is no mass.     Tenderness: There is no abdominal tenderness. There is no guarding.  Musculoskeletal: Normal range of motion.        General: No swelling or deformity.  Psychiatric:        Attention and Perception: Attention normal. She does not perceive auditory or visual hallucinations.        Mood and Affect: Mood is depressed. Affect is not angry or inappropriate.        Speech: Speech normal.        Behavior: Behavior is cooperative.        Thought Content: Thought content normal. Thought content is not delusional. Thought content does not include homicidal or suicidal ideation. Thought content does not include homicidal or suicidal plan.        Cognition and Memory: Cognition normal.        Judgment: Judgment normal.     Comments: Teary throughout exam     GAD 7 : Generalized Anxiety Score 09/01/2018  Nervous, Anxious, on Edge 3  Control/stop worrying 2  Worry too much - different things 3  Trouble relaxing 3  Restless 2  Easily annoyed or irritable 3  Afraid - awful might happen 3  Total GAD 7 Score 19  Anxiety Difficulty Somewhat difficult      [facility]  PHQ-9 Total Score  20         Assessment:     Poorly controlled DM Depression Anxiety    Plan:     Check problem list

## 2018-09-01 NOTE — Assessment & Plan Note (Signed)
Depression with anxiety. See GAD7 and PHQ9. Does not currently have symptoms suggestive of bipolar. She had never been on meds for bipolar although it was listed in her hx. I will treat her anxiety and depression with Cymbalta. I discussed effect of medications with her and need to watch out for side effects. [facility] was [facility]. [facility] her as well [facility]. Return precaution discussed.

## 2018-09-01 NOTE — Telephone Encounter (Signed)
Patient has an appointment here today.  Will speak with her regarding this as I was unable to reach her yesterday.  Naydeline Morace,CMA

## 2018-09-01 NOTE — Assessment & Plan Note (Signed)
Continue current DM regimen. I refilled her meds. I recently increased her Gabapentin from 300 mg TID to 400 mg TID. She will benefit from tight glycemic control in addition. Cymbalta was started for Depression and anxiety which should hopefully provide some relieve of her neuropathic pain.
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
## 2018-09-01 NOTE — Patient Instructions (Addendum)
It was nice seeing you. Please continue current regimen for your diabetes and neuropathy. I will start you on Cymbalta for anxiety and depression. Please watch out for side effects listed below. Call if you have any concern. I will like to see you back in 2 weeks.  Duloxetine delayed-release capsules What is this medicine? DULOXETINE (doo LOX e teen) is used to treat depression, anxiety, and different types of chronic pain. This medicine may be used for other purposes; ask your health care provider or pharmacist if you have questions. COMMON BRAND NAME(S): Cymbalta, [place] What should I tell my health care provider before I take this medicine? They need to know if you have any of these conditions: -bipolar disorder or a family history of bipolar disorder -glaucoma -kidney disease -liver disease -suicidal thoughts or a previous suicide attempt -taken medicines called MAOIs like Carbex, Eldepryl, Marplan, Nardil, and Parnate within 14 days -an unusual reaction to duloxetine, other medicines, foods, dyes, or preservatives -pregnant or trying to get pregnant -breast-feeding How should I use this medicine? Take this medicine by mouth with a glass of water. Follow the directions on the prescription label. Do not cut, crush or chew this medicine. You can take this medicine with or without food. Take your medicine at regular intervals. Do not take your medicine more often than directed. Do not stop taking this medicine suddenly except upon the advice of your doctor. Stopping this medicine too quickly may cause serious side effects or your condition may worsen. A special MedGuide will be given to you by the pharmacist with each prescription and refill. Be sure to read this information carefully each time. Talk to your pediatrician regarding the use of this medicine in children. While this drug may be prescribed for children as young as 31 years of age for selected conditions, precautions do  apply. Overdosage: If you think you have taken too much of this medicine contact a poison control center or emergency room at once. NOTE: This medicine is only for you. Do not share this medicine with others. What if I miss a dose? If you miss a dose, take it as soon as you can. If it is almost time for your next dose, take only that dose. Do not take double or extra doses. What may interact with this medicine? Do not take this medicine with any of the following medications: -desvenlafaxine -levomilnacipran -linezolid -MAOIs like Carbex, Eldepryl, Marplan, Nardil, and Parnate -methylene blue (injected into a vein) -milnacipran -thioridazine -venlafaxine This medicine may also interact with the following medications: -alcohol -amphetamines -aspirin and aspirin-like medicines -certain antibiotics like ciprofloxacin and enoxacin -certain medicines for blood pressure, heart disease, irregular heart beat -certain medicines for depression, anxiety, or psychotic disturbances -certain medicines for migraine headache like almotriptan, eletriptan, frovatriptan, naratriptan, rizatriptan, sumatriptan, zolmitriptan -certain medicines that treat or prevent blood clots like warfarin, enoxaparin, and dalteparin -cimetidine -fentanyl -lithium -NSAIDS, medicines for pain and inflammation, like ibuprofen or naproxen -phentermine -procarbazine -rasagiline -sibutramine -St. John's wort -theophylline -tramadol -tryptophan This list may not describe all possible interactions. Give your health care provider a list of all the medicines, herbs, non-prescription drugs, or dietary supplements you use. Also tell them if you smoke, drink alcohol, or use illegal drugs. Some items may interact with your medicine. What should I watch for while using this medicine? Tell your doctor if your symptoms do not get better or if they get worse. Visit your doctor or health care professional for regular checks on your  progress.  Because it may take several weeks to see the full effects of this medicine, it is important to continue your treatment as prescribed by your doctor. Patients and their families should watch out for new or worsening thoughts of suicide or depression. Also watch out for sudden changes in feelings such as feeling anxious, agitated, panicky, irritable, hostile, aggressive, impulsive, severely restless, overly excited and hyperactive, or not being able to sleep. If this happens, especially at the beginning of treatment or after a change in dose, call your health care professional. Bonita Quin may get drowsy or dizzy. Do not drive, use machinery, or do anything that needs mental alertness until you know how this medicine affects you. Do not stand or sit up quickly, especially if you are an older patient. This reduces the risk of dizzy or fainting spells. Alcohol may interfere with the effect of this medicine. Avoid alcoholic drinks. This medicine can cause an increase in blood pressure. This medicine can also cause a sudden drop in your blood pressure, which may make you feel faint and increase the chance of a fall. These effects are most common when you first start the medicine or when the dose is increased, or during use of other medicines that can cause a sudden drop in blood pressure. Check with your doctor for instructions on monitoring your blood pressure while taking this medicine. Your mouth may get dry. Chewing sugarless gum or sucking hard candy, and drinking plenty of water may help. Contact your doctor if the problem does not go away or is severe. What side effects may I notice from receiving this medicine? Side effects that you should report to your doctor or health care professional as soon as possible: -allergic reactions like skin rash, itching or hives, swelling of the face, lips, or tongue -anxious -breathing problems -confusion -changes in vision -chest pain -confusion -elevated mood,  decreased need for sleep, racing thoughts, impulsive behavior -eye pain -fast, irregular heartbeat -feeling faint or lightheaded, falls -feeling agitated, angry, or irritable -hallucination, loss of contact with reality -high blood pressure -loss of balance or coordination -palpitations -redness, blistering, peeling or loosening of the skin, including inside the mouth -restlessness, pacing, inability to keep still -seizures -stiff muscles -suicidal thoughts or other mood changes -trouble passing urine or change in the amount of urine -trouble sleeping -unusual bleeding or bruising -unusually weak or tired -vomiting -yellowing of the eyes or skin Side effects that usually do not require medical attention (report to your doctor or health care professional if they continue or are bothersome): -change in sex drive or performance -change in appetite or weight -constipation -dizziness -dry mouth -headache -increased sweating -nausea -tired This list may not describe all possible side effects. Call your doctor for medical advice about side effects. You may report side effects to FDA at 1-800-FDA-1088. Where should I keep my medicine? Keep out of the reach of children. Store at room temperature between 20 and 25 degrees C (68 to 77 degrees F). Throw away any unused medicine after the expiration date. NOTE: This sheet is a summary. It may not cover all possible information. If you have questions about this medicine, talk to your doctor, pharmacist, or health care provider.  2019 Elsevier/Gold Standard (2016-01-11 18:16:03)

## 2018-09-02 ENCOUNTER — Encounter: Payer: Self-pay | Admitting: Gastroenterology

## 2018-09-03 ENCOUNTER — Encounter: Payer: Self-pay | Admitting: Family Medicine

## 2018-09-08 ENCOUNTER — Telehealth: Payer: Self-pay | Admitting: Licensed Clinical Social Worker

## 2018-09-08 NOTE — Telephone Encounter (Signed)
Type of Service: [facility]al [facility] F/U Call  F/U call to patient ref. [facility] appointment from last week.  Left message to call LCSW.  Plan:  LCSW will wait for return call.  If no return call is received will talk with patient during her next appointment Jan. 21st  Sammuel Hines, [facility]   [phone] 3:00 PM

## 2018-09-13 ENCOUNTER — Ambulatory Visit (HOSPITAL_COMMUNITY)
Admission: EM | Admit: 2018-09-13 | Discharge: 2018-09-13 | Disposition: A | Discharge status: Home / Self Care | Payer: Managed Care, Other (non HMO) | Attending: Internal Medicine | Admitting: Internal Medicine

## 2018-09-13 ENCOUNTER — Encounter (HOSPITAL_COMMUNITY): Payer: Self-pay | Admitting: *Deleted

## 2018-09-13 DIAGNOSIS — E1142 Type 2 diabetes mellitus with diabetic polyneuropathy: Secondary | ICD-10-CM

## 2018-09-13 HISTORY — DX: Type 2 diabetes mellitus with diabetic neuropathy, unspecified: E11.40

## 2018-09-13 MED ORDER — GABAPENTIN 300 MG PO CAPS: 600.0000 mg | ORAL_CAPSULE | Freq: Three times a day (TID) | ORAL | 0 refills | Status: DC

## 2018-09-13 MED ORDER — LIDO-CAPSAICIN-MEN-METHYL SAL 0.5-0.035-5-20 % EX PTCH: 1.0000 | MEDICATED_PATCH | Freq: Every day | CUTANEOUS | 0 refills | Status: AC

## 2018-09-13 NOTE — ED Provider Notes (Signed)
[facility]   [id number] 09/13/18 Arrival Time: 1112  CC: BLE  SUBJECTIVE: History from: patient. Lisa Marshall is a 23 y.o. female complains of bilateral LE pain secondary to diabetic neuropathy that worsened three days ago.   Diagnosed with diabetes last year.  States well-controlled, A1C <6.5 and blood glucose this morning 118.  Currently on gabapentin 400mg  three times a day and cymbalta 20 mg twice daily.  Describes the pain as constant and burning in character.  Symptoms are made worse with walking and the cold.  Denies fever, chills, polydipsia, polyuria,chest pain, SOB, nausea, vomiting, abdominal pain, erythema, ecchymosis, effusion, weakness.  ROS: As per HPI.  Past Medical History:  Diagnosis Date  . Asthma   . Depression   . Diabetic neuropathy (HCC)   . Encounter for surveillance of injectable contraceptive 12/23/2014  . History of PID   . Paronychia of great toe of left foot 10/03/2017  . Pregnancy, multiple   . Syncope   . Vaginal bleeding 05/30/2017   Past Surgical History:  Procedure Laterality Date  . ADENOIDECTOMY    . CESAREAN SECTION N/A 11/18/2013   Procedure: Primary Cesarean Section Delivery Baby "A" Girl @ 0447, Apgars 1/2/4, Baby "B" @ 0450, Apgars 8/9;  Surgeon: Kathreen CosierBernard A Marshall, MD;  Location: [facility];  Service: Obstetrics;  Laterality: N/A;  . TONSILLECTOMY     Allergies  Allergen Reactions  . Penicillins Hives    Has patient had a PCN reaction causing immediate rash, facial/tongue/throat swelling, SOB or lightheadedness with hypotension: Yes (rash/hives) Has patient had a PCN reaction causing severe rash involving mucus membranes or skin necrosis: No Has patient had a PCN reaction that required hospitalization: No Has patient had a PCN reaction occurring within the last 10 years: No If all of the above answers are "NO", then may proceed with Cephalosporin use.     No current facility-administered medications on file prior to  encounter.    Current Outpatient Medications on File Prior to Encounter  Medication Sig Dispense Refill  . Ascorbic Acid (VITAMIN C PO) Take 1 tablet by mouth daily.    . Dulaglutide (TRULICITY) 1.5 MG/0.5ML SOPN Inject 1.5 mg into the skin once a week. 12 pen 1  . DULoxetine (CYMBALTA) 20 MG capsule Take 1 capsule (20 mg total) by mouth 2 (two) times daily. 60 capsule 1  . glipiZIDE (GLUCOTROL) 10 MG tablet Take 1 tablet (10 mg total) by mouth daily. 90 tablet 1  . Insulin Glargine (LANTUS SOLOSTAR) 100 UNIT/ML Solostar Pen Inject 10 Units into the skin daily. 15 pen 3  . MedroxyPROGESTERone Acetate (DEPO-PROVERA IM) Inject into the muscle every 3 (three) months. Last injection 1st part of August 2018    . metFORMIN (GLUCOPHAGE) 500 MG tablet TAKE 2 TABLETS BY MOUTH TWICE DAILY WITH FOOD 180 tablet 2  . Omega-3 Fatty Acids (FISH OIL) 1000 MG CAPS Take 1,000 capsules by mouth 2 (two) times daily.     Marland Kitchen. albuterol (PROVENTIL HFA;VENTOLIN HFA) 108 (90 Base) MCG/ACT inhaler Inhale 2 puffs into the lungs every 6 (six) hours as needed for wheezing or shortness of breath. 18 g 2  . BD PEN NEEDLE NANO U/F 32G X 4 MM MISC INJECT 10 UNITS DAILY AT 10PM 100 each 3  . nystatin (NYSTATIN) powder Apply to breast creases when you have rash twice a day (Patient not taking: Reported on 08/05/2018) 60 g 0   Social History   Socioeconomic History  . Marital status: Single  Spouse name: Not on file  . Number of children: Not on file  . Years of education: Not on file  . Highest education level: Not on file  Occupational History  . Not on file  Social Needs  . Financial resource strain: Not on file  . Food insecurity:    Worry: Not on file    Inability: Not on file  . Transportation needs:    Medical: Not on file    Non-medical: Not on file  Tobacco Use  . Smoking status: Never Smoker  . Smokeless tobacco: Never Used  Substance and Sexual Activity  . Alcohol use: No  . Drug use: No  . Sexual  activity: Not on file  Lifestyle  . Physical activity:    Days per week: Not on file    Minutes per session: Not on file  . Stress: Not on file  Relationships  . Social connections:    Talks on phone: Not on file    Gets together: Not on file    Attends religious service: Not on file    Active member of club or organization: Not on file    Attends meetings of clubs or organizations: Not on file    Relationship status: Not on file  . Intimate partner violence:    Fear of current or ex partner: Not on file    Emotionally abused: Not on file    Physically abused: Not on file    Forced sexual activity: Not on file  Other Topics Concern  . Not on file  Social History Narrative  . Not on file   Family History  Problem Relation Age of Onset  . Asthma Other   . Diabetes Other   . Cancer Other   . Sickle cell anemia Mother     OBJECTIVE:  Vitals:   09/13/18 1218  BP: 125/76  Pulse: (!) 107  Resp: 16  Temp: 99.8 F (37.7 C)  TempSrc: Temporal  SpO2: 100%    General appearance: Alert; in no acute distress.  Head: NCAT Lungs: CTA bilaterally Heart: RRR.  MSK: Feet: Inspection: Callus plantar aspect of second MTP RT foot; and plantar aspect of latera fifth MTP LT foot.  Sensation: Decreased sensation about the bilateral lower extremities; difficulty with sharp vs. Dull sensation over L4-5, S1-2 of the RT foot and S1-2 LT foot; dorsalis pedis pulses intact 2+ Skin: warm and dry Neurologic: Ambulates without difficulty Psychological: alert and cooperative; normal mood and affect  ASSESSMENT & PLAN:  1. Diabetic polyneuropathy associated with type 2 diabetes mellitus (HCC)     Meds ordered this encounter  Medications  . gabapentin (NEURONTIN) 300 MG capsule    Sig: Take 2 capsules (600 mg total) by mouth 3 (three) times daily for 30 days.    Dispense:  180 capsule    Refill:  0    Order Specific Question:   Supervising Provider    Answer:   Eustace MooreNELSON, YVONNE SUE  [[id number]]  . Lido-Capsaicin-Men-Methyl Sal 0.5-0.035-5-20 % PTCH    Sig: Apply 1 patch topically daily. You may leave in place for 12-24 hours    Dispense:  5 patch    Refill:  0    Order Specific Question:   Supervising Provider    Answer:   Eustace MooreELSON, YVONNE SUE [[id number]]    Gabapentin dose increased from 400mg  to 600mg  three times a day.  Take as directed Lidocaine patched prescribed.  Apply to painful areas as directed for up to 24  hours.  Make sure to wash hands after use Follow up with PCP this week for reevaluation and further management of chronic diabetic neuropathy Return or go to the ER if you have any new or worsening symptoms (fever, chills, chest pain, abdominal pain, changes in bowel or bladder habits, pain radiating into lower legs, etc...)   Reviewed expectations re: course of current medical issues. Questions answered. Outlined signs and symptoms indicating need for more acute intervention. Patient verbalized understanding. After Visit Summary given.    Rennis Harding, [name] 09/13/18 1406

## 2018-09-13 NOTE — ED Triage Notes (Signed)
Denies injury.  C/O recent dx diabetic neuropathy.  Recently had gabapentin dose increased and placed on duloxetine, but states not helping.  C/O BLE pain.

## 2018-09-13 NOTE — Discharge Instructions (Signed)
Gabapentin dose increased from 400mg  to 600mg  three times a day.  Take as directed Lidocaine patched prescribed.  Apply to painful areas as directed for up to 24 hours.  Make sure to wash hands after use Follow up with PCP this week for reevaluation and further management of chronic diabetic neuropathy Return or go to the ER if you have any new or worsening symptoms (fever, chills, chest pain, abdominal pain, changes in bowel or bladder habits, pain radiating into lower legs, etc...)

## 2018-09-15 ENCOUNTER — Other Ambulatory Visit: Payer: Self-pay

## 2018-09-15 ENCOUNTER — Ambulatory Visit: Payer: Self-pay | Admitting: Family Medicine

## 2018-09-15 ENCOUNTER — Encounter: Payer: Self-pay | Admitting: Family Medicine

## 2018-09-15 ENCOUNTER — Ambulatory Visit: Payer: Managed Care, Other (non HMO)

## 2018-09-15 ENCOUNTER — Ambulatory Visit (INDEPENDENT_AMBULATORY_CARE_PROVIDER_SITE_OTHER): Payer: BLUE CROSS/BLUE SHIELD | Admitting: Family Medicine

## 2018-09-15 VITALS — BP 108/62 | HR 104 | Temp 98.7°F | Wt 267.1 lb

## 2018-09-15 DIAGNOSIS — F39 Unspecified mood [affective] disorder: Secondary | ICD-10-CM | POA: Diagnosis not present

## 2018-09-15 DIAGNOSIS — E1165 Type 2 diabetes mellitus with hyperglycemia: Secondary | ICD-10-CM | POA: Diagnosis not present

## 2018-09-15 DIAGNOSIS — K625 Hemorrhage of anus and rectum: Secondary | ICD-10-CM

## 2018-09-15 DIAGNOSIS — L84 Corns and callosities: Secondary | ICD-10-CM | POA: Diagnosis not present

## 2018-09-15 DIAGNOSIS — E1140 Type 2 diabetes mellitus with diabetic neuropathy, unspecified: Secondary | ICD-10-CM

## 2018-09-15 LAB — GLUCOSE, POCT (MANUAL RESULT ENTRY): POC Glucose: 267 mg/dl — AB (ref 70–99)

## 2018-09-15 NOTE — Patient Instructions (Signed)
Neuropathic Pain Neuropathic pain is pain caused by damage to the nerves that are responsible for certain sensations in your body (sensory nerves). The pain can be caused by:  Damage to the sensory nerves that send signals to your spinal cord and brain (peripheral nervous system).  Damage to the sensory nerves in your brain or spinal cord (central nervous system). Neuropathic pain can make you more sensitive to pain. Even a minor sensation can feel very painful. This is usually a long-term condition that can be difficult to treat. The type of pain differs from person to person. It may:  Start suddenly (acute), or it may develop slowly and last for a long time (chronic).  Come and go as damaged nerves heal, or it may stay at the same level for years.  Cause emotional distress, loss of sleep, and a lower quality of life. What are the causes? The most common cause of this condition is diabetes. Many other diseases and conditions can also cause neuropathic pain. Causes of neuropathic pain can be classified as:  Toxic. This is caused by medicines and chemicals. The most common cause of toxic neuropathic pain is damage from cancer treatments (chemotherapy).  Metabolic. This can be caused by: ? Diabetes. This is the most common disease that damages the nerves. ? Lack of vitamin B from long-term alcohol abuse.  Traumatic. Any injury that cuts, crushes, or stretches a nerve can cause damage and pain. A common example is feeling pain after losing an arm or leg (phantom limb pain).  Compression-related. If a sensory nerve gets trapped or compressed for a long period of time, the blood supply to the nerve can be cut off.  Vascular. Many blood vessel diseases can cause neuropathic pain by decreasing blood supply and oxygen to nerves.  Autoimmune. This type of pain results from diseases in which the body's defense system (immune system) mistakenly attacks sensory nerves. Examples of autoimmune diseases  that can cause neuropathic pain include lupus and multiple sclerosis.  Infectious. Many types of viral infections can damage sensory nerves and cause pain. Shingles infection is a common cause of this type of pain.  Inherited. Neuropathic pain can be a symptom of many diseases that are passed down through families (genetic). What increases the risk? You are more likely to develop this condition if:  You have diabetes.  You smoke.  You drink too much alcohol.  You are taking certain medicines, including medicines that kill cancer cells (chemotherapy) or that treat immune system disorders. What are the signs or symptoms? The main symptom is pain. Neuropathic pain is often described as:  Burning.  Shock-like.  Stinging.  Hot or cold.  Itching. How is this diagnosed? No single test can diagnose neuropathic pain. It is diagnosed based on:  Physical exam and your symptoms. Your health care provider will ask you about your pain. You may be asked to use a pain scale to describe how bad your pain is.  Tests. These may be done to see if you have a high sensitivity to pain and to help find the cause and location of any sensory nerve damage. They include: ? Nerve conduction studies to test how well nerve signals travel through your sensory nerves (electrodiagnostic testing). ? Stimulating your sensory nerves through electrodes on your skin and measuring the response in your spinal cord and brain (somatosensory evoked potential).  Imaging studies, such as: ? X-rays. ? CT scan. ? MRI. How is this treated? Treatment for neuropathic pain may change   over time. You may need to try different treatment options or a combination of treatments. Some options include:  Treating the underlying cause of the neuropathy, such as diabetes, kidney disease, or vitamin deficiencies.  Stopping medicines that can cause neuropathy, such as chemotherapy.  Medicine to relieve pain. Medicines may include: ?  Prescription or over-the-counter pain medicine. ? Anti-seizure medicine. ? Antidepressant medicines. ? Pain-relieving patches that are applied to painful areas of skin. ? A medicine to numb the area (local anesthetic), which can be injected as a nerve block.  Transcutaneous nerve stimulation. This uses electrical currents to block painful nerve signals. The treatment is painless.  Alternative treatments, such as: ? Acupuncture. ? Meditation. ? Massage. ? Physical therapy. ? Pain management programs. ? Counseling. Follow these instructions at home: Medicines   Take over-the-counter and prescription medicines only as told by your health care provider.  Do not drive or use heavy machinery while taking prescription pain medicine.  If you are taking prescription pain medicine, take actions to prevent or treat constipation. Your health care provider may recommend that you: ? Drink enough fluid to keep your urine pale yellow. ? Eat foods that are high in fiber, such as fresh fruits and vegetables, whole grains, and beans. ? Limit foods that are high in fat and processed sugars, such as fried or sweet foods. ? Take an over-the-counter or prescription medicine for constipation. Lifestyle   Have a good support system at home.  Consider joining a chronic pain support group.  Do not use any products that contain nicotine or tobacco, such as cigarettes and e-cigarettes. If you need help quitting, ask your health care provider.  Do not drink alcohol. General instructions  Learn as much as you can about your condition.  Work closely with all your health care providers to find the treatment plan that works best for you.  Ask your health care provider what activities are safe for you.  Keep all follow-up visits as told by your health care provider. This is important. Contact a health care provider if:  Your pain treatments are not working.  You are having side effects from your  medicines.  You are struggling with tiredness (fatigue), mood changes, depression, or anxiety. Summary  Neuropathic pain is pain caused by damage to the nerves that are responsible for certain sensations in your body (sensory nerves).  Neuropathic pain may come and go as damaged nerves heal, or it may stay at the same level for years.  Neuropathic pain is usually a long-term condition that can be difficult to treat. Consider joining a chronic pain support group. This information is not intended to replace advice given to you by your health care provider. Make sure you discuss any questions you have with your health care provider. Document Released: 05/09/2004 Document Revised: 08/29/2017 Document Reviewed: 08/29/2017 Elsevier Interactive Patient Education  2019 Elsevier Inc.  

## 2018-09-15 NOTE — Assessment & Plan Note (Addendum)
She is compliant with her meds. Can do better on diet and weight loss. Continue current diet. Return in 2-3 weeks for A1C check. Neuropathy improved some on current regimen. We will consider switching to Lyrica if pain persists.

## 2018-09-15 NOTE — Telephone Encounter (Signed)
Patient needs form signed by doctor.  You can fax or she will pick it up .

## 2018-09-15 NOTE — Assessment & Plan Note (Signed)
GI appointment already made. Return soon or to the ED if persistent or having symptoms. Currently she is asymptomatic and hemodynamically stable.

## 2018-09-15 NOTE — Assessment & Plan Note (Signed)
She said she is doing well on Cymbalta. Continue current dose for now.  Schedule Psych appointment with as previously advised.

## 2018-09-15 NOTE — Progress Notes (Signed)
Subjective:     Patient ID: Lisa Marshall, female   DOB: 06-Jun-1996, 23 y.o.   MRN: [id number]  HPI DM: Here for f/u. Compliant with her meds. She is currently on Trulicity 1.5 mg q week, Metformin 1000 mg BID, Lantus 10 units qd and Glipizide 10 mg qd. Her home CBG runs ok, this morning it was 105. She just ate cinnamon roll and drank a cup of coffee prior to her visit this morning. Feels well otherwise. Neuropathy:Recent ED visit for severe neuropathic pain. Her Gabapentin was increased to 600 mg TID. She is compliant with her Cymbalta. Pain improved a bit since then. Feels like cold weather worsen her symptom. Depression: Improved on Cymbalta. [facility] since she needed to get to school. Bloody bowel: 2 days ago x one episode. Denies any other GI issues.  Current Outpatient Medications on File Prior to Visit  Medication Sig Dispense Refill  . albuterol (PROVENTIL HFA;VENTOLIN HFA) 108 (90 Base) MCG/ACT inhaler Inhale 2 puffs into the lungs every 6 (six) hours as needed for wheezing or shortness of breath. 18 g 2  . Ascorbic Acid (VITAMIN C PO) Take 1 tablet by mouth daily.    . BD PEN NEEDLE NANO U/F 32G X 4 MM MISC INJECT 10 UNITS DAILY AT 10PM 100 each 3  . Dulaglutide (TRULICITY) 1.5 MG/0.5ML SOPN Inject 1.5 mg into the skin once a week. 12 pen 1  . DULoxetine (CYMBALTA) 20 MG capsule Take 1 capsule (20 mg total) by mouth 2 (two) times daily. 60 capsule 1  . gabapentin (NEURONTIN) 300 MG capsule Take 2 capsules (600 mg total) by mouth 3 (three) times daily for 30 days. 180 capsule 0  . glipiZIDE (GLUCOTROL) 10 MG tablet Take 1 tablet (10 mg total) by mouth daily. 90 tablet 1  . Insulin Glargine (LANTUS SOLOSTAR) 100 UNIT/ML Solostar Pen Inject 10 Units into the skin daily. 15 pen 3  . Lido-Capsaicin-Men-Methyl Sal 0.5-0.035-5-20 % PTCH Apply 1 patch topically daily. You may leave in place for 12-24 hours 5 patch 0  . MedroxyPROGESTERone Acetate  (DEPO-PROVERA IM) Inject into the muscle every 3 (three) months. Last injection 1st part of August 2018    . metFORMIN (GLUCOPHAGE) 500 MG tablet TAKE 2 TABLETS BY MOUTH TWICE DAILY WITH FOOD 180 tablet 2  . nystatin (NYSTATIN) powder Apply to breast creases when you have rash twice a day (Patient not taking: Reported on 08/05/2018) 60 g 0  . Omega-3 Fatty Acids (FISH OIL) 1000 MG CAPS Take 1,000 capsules by mouth 2 (two) times daily.      No current facility-administered medications on file prior to visit.    Past Medical History:  Diagnosis Date  . Asthma   . Depression   . Diabetic neuropathy (HCC)   . Encounter for surveillance of injectable contraceptive 12/23/2014  . History of PID   . Paronychia of great toe of left foot 10/03/2017  . Pregnancy, multiple   . Syncope   . Vaginal bleeding 05/30/2017     Review of Systems  Respiratory: Negative.   Cardiovascular: Negative.   Gastrointestinal: Positive for blood in stool. Negative for constipation and diarrhea.  Genitourinary: Negative.   Neurological:       Burning feet  Psychiatric/Behavioral: Negative for suicidal ideas.  All other systems reviewed and are negative.      Objective:   Physical Exam Vitals signs and nursing note reviewed.  Constitutional:  General: She is not in acute distress.    Appearance: Normal appearance. She is not ill-appearing.  Cardiovascular:     Rate and Rhythm: Normal rate and regular rhythm.     Pulses: Normal pulses.     Heart sounds: Normal heart sounds. No murmur.  Pulmonary:     Effort: No respiratory distress.     Breath sounds: Normal breath sounds. No wheezing.  Abdominal:     General: Abdomen is flat. Bowel sounds are normal. There is no distension.     Palpations: There is no mass.  Musculoskeletal:        General: No swelling.     Comments: ++ calluses more on her right foot.  Neurological:     Mental Status: She is alert.     Cranial Nerves: Cranial nerves are intact.      Sensory: Sensation is intact.     Motor: Motor function is intact.  Psychiatric:        Thought Content: Thought content does not include homicidal or suicidal ideation. Thought content does not include homicidal or suicidal plan.        Assessment:     DM2 uncontrolled DM neuropathy Rectal bleed Depression    Plan:     Check problem list.

## 2018-09-16 NOTE — Telephone Encounter (Signed)
Form has been completed and was placed in the RN's box on 09/16/18.

## 2018-09-16 NOTE — Addendum Note (Signed)
Addended by: Janit Pagan T on: 09/16/2018 01:49 PM   Modules accepted: Orders

## 2018-09-16 NOTE — Telephone Encounter (Signed)
Pt contacted and informed of form ready for pick up. I also faxed the form to her employer, per her request. A copy was made for batch scanning as well.

## 2018-09-16 NOTE — Telephone Encounter (Signed)
Clinical info completed on FMLA form.  Place form in Dr. Eniola's box for completion.  [name],  [name], CMA   

## 2018-09-17 ENCOUNTER — Encounter: Payer: Self-pay | Admitting: Family Medicine

## 2018-09-17 DIAGNOSIS — Z3042 Encounter for surveillance of injectable contraceptive: Secondary | ICD-10-CM | POA: Diagnosis not present

## 2018-09-17 DIAGNOSIS — Z6841 Body Mass Index (BMI) 40.0 and over, adult: Secondary | ICD-10-CM | POA: Diagnosis not present

## 2018-09-23 ENCOUNTER — Ambulatory Visit: Payer: Self-pay | Admitting: Gastroenterology

## 2018-09-23 NOTE — Progress Notes (Deleted)
Referring Provider: Doreene Eland, MD Primary Care Physician:  Doreene Eland, MD   Reason for Consultation: Rectal bleeding   IMPRESSION:  ***  PLAN: ***   HPI: Lisa Marshall is a 23 y.o. female seen in consultation for evaluation of rectal bleeding.  The history is obtained through the patient and review of her electronic health record.   Quality: Amount: Duration: Timing: Progression: Chronicity: Context: Similar prior episodes: Relieved by: Worsened by: Effective treatments: Ineffective treatments: Associated symptoms: Risks factors:   Past Medical History:  Diagnosis Date  . Asthma   . Depression   . Diabetic neuropathy (HCC)   . Encounter for surveillance of injectable contraceptive 12/23/2014  . History of PID   . Paronychia of great toe of left foot 10/03/2017  . Pregnancy, multiple   . Syncope   . Vaginal bleeding 05/30/2017    Past Surgical History:  Procedure Laterality Date  . ADENOIDECTOMY    . CESAREAN SECTION N/A 11/18/2013   Procedure: Primary Cesarean Section Delivery Baby "A" Girl @ 0447, Apgars 1/2/4, Baby "B" @ 0450, Apgars 8/9;  Surgeon: Kathreen Cosier, MD;  Location: [facility];  Service: Obstetrics;  Laterality: N/A;  . TONSILLECTOMY      Current Outpatient Medications  Medication Sig Dispense Refill  . albuterol (PROVENTIL HFA;VENTOLIN HFA) 108 (90 Base) MCG/ACT inhaler Inhale 2 puffs into the lungs every 6 (six) hours as needed for wheezing or shortness of breath. (Patient not taking: Reported on 09/15/2018) 18 g 2  . Ascorbic Acid (VITAMIN C PO) Take 1 tablet by mouth daily.    . BD PEN NEEDLE NANO U/F 32G X 4 MM MISC INJECT 10 UNITS DAILY AT 10PM 100 each 3  . Dulaglutide (TRULICITY) 1.5 MG/0.5ML SOPN Inject 1.5 mg into the skin once a week. 12 pen 1  . DULoxetine (CYMBALTA) 20 MG capsule Take 1 capsule (20 mg total) by mouth 2 (two) times daily. 60 capsule 1  . gabapentin (NEURONTIN) 300 MG capsule Take 2 capsules (600  mg total) by mouth 3 (three) times daily for 30 days. 180 capsule 0  . glipiZIDE (GLUCOTROL) 10 MG tablet Take 1 tablet (10 mg total) by mouth daily. 90 tablet 1  . Insulin Glargine (LANTUS SOLOSTAR) 100 UNIT/ML Solostar Pen Inject 10 Units into the skin daily. 15 pen 3  . Lido-Capsaicin-Men-Methyl Sal 0.5-0.035-5-20 % PTCH Apply 1 patch topically daily. You may leave in place for 12-24 hours 5 patch 0  . MedroxyPROGESTERone Acetate (DEPO-PROVERA IM) Inject into the muscle every 3 (three) months. Last injection 1st part of August 2018    . metFORMIN (GLUCOPHAGE) 500 MG tablet TAKE 2 TABLETS BY MOUTH TWICE DAILY WITH FOOD 180 tablet 2  . nystatin (NYSTATIN) powder Apply to breast creases when you have rash twice a day (Patient not taking: Reported on 08/05/2018) 60 g 0  . Omega-3 Fatty Acids (FISH OIL) 1000 MG CAPS Take 1,000 capsules by mouth 2 (two) times daily.      No current facility-administered medications for this visit.     Allergies as of 09/23/2018 - Review Complete 09/15/2018  Allergen Reaction Noted  . Penicillins Hives 05/20/2011    Family History  Problem Relation Age of Onset  . Asthma Other   . Diabetes Other   . Cancer Other   . Sickle cell anemia Mother     Social History   Socioeconomic History  . Marital status: Single    Spouse name: Not on file  .  Number of children: Not on file  . Years of education: Not on file  . Highest education level: Not on file  Occupational History  . Not on file  Social Needs  . Financial resource strain: Not on file  . Food insecurity:    Worry: Not on file    Inability: Not on file  . Transportation needs:    Medical: Not on file    Non-medical: Not on file  Tobacco Use  . Smoking status: Never Smoker  . Smokeless tobacco: Never Used  Substance and Sexual Activity  . Alcohol use: No  . Drug use: No  . Sexual activity: Not on file  Lifestyle  . Physical activity:    Days per week: Not on file    Minutes per  session: Not on file  . Stress: Not on file  Relationships  . Social connections:    Talks on phone: Not on file    Gets together: Not on file    Attends religious service: Not on file    Active member of club or organization: Not on file    Attends meetings of clubs or organizations: Not on file    Relationship status: Not on file  . Intimate partner violence:    Fear of current or ex partner: Not on file    Emotionally abused: Not on file    Physically abused: Not on file    Forced sexual activity: Not on file  Other Topics Concern  . Not on file  Social History Narrative  . Not on file    Review of Systems: 12 system ROS is negative except as noted above.  There were no vitals filed for this visit.  Physical Exam: Vital signs were reviewed. General:   Alert, well-nourished, pleasant and cooperative in NAD Head:  Normocephalic and atraumatic. Eyes:  Sclera clear, no icterus.   Conjunctiva pink. Mouth:  No deformity or lesions.   Neck:  Supple; no thyromegaly. Lungs:  Clear throughout to auscultation.   No wheezes.  Heart:  Regular rate and rhythm; no murmurs Abdomen:  Soft, nontender, normal bowel sounds. No rebound or guarding. No hepatosplenomegaly Rectal:  Deferred  Msk:  Symmetrical without gross deformities. Extremities:  No gross deformities or edema. Neurologic:  Alert and  oriented x4;  grossly nonfocal Skin:  No rash or bruise. Psych:  Alert and cooperative. Normal mood and affect.   Sary Bogie L. Orvan Falconer, MD, MPH [facility] 09/23/2018, 11:59 AM

## 2018-09-28 ENCOUNTER — Other Ambulatory Visit: Payer: Self-pay | Admitting: Family Medicine

## 2018-09-29 ENCOUNTER — Other Ambulatory Visit (HOSPITAL_COMMUNITY)
Admission: RE | Admit: 2018-09-29 | Discharge: 2018-09-29 | Disposition: A | Discharge status: Home / Self Care | Payer: BLUE CROSS/BLUE SHIELD | Source: Ambulatory Visit | Attending: Family Medicine | Admitting: Family Medicine

## 2018-09-29 ENCOUNTER — Ambulatory Visit (INDEPENDENT_AMBULATORY_CARE_PROVIDER_SITE_OTHER): Payer: BLUE CROSS/BLUE SHIELD | Admitting: Family Medicine

## 2018-09-29 VITALS — BP 118/70 | HR 124 | Temp 99.7°F | Wt 262.0 lb

## 2018-09-29 DIAGNOSIS — B379 Candidiasis, unspecified: Secondary | ICD-10-CM | POA: Diagnosis not present

## 2018-09-29 DIAGNOSIS — B9689 Other specified bacterial agents as the cause of diseases classified elsewhere: Secondary | ICD-10-CM | POA: Diagnosis not present

## 2018-09-29 DIAGNOSIS — N898 Other specified noninflammatory disorders of vagina: Secondary | ICD-10-CM

## 2018-09-29 DIAGNOSIS — N760 Acute vaginitis: Secondary | ICD-10-CM | POA: Diagnosis not present

## 2018-09-29 LAB — POCT WET PREP (WET MOUNT)
CLUE CELLS WET PREP WHIFF POC: NEGATIVE
Trichomonas Wet Prep HPF POC: ABSENT

## 2018-09-29 MED ORDER — METRONIDAZOLE 500 MG PO TABS: 500.0000 mg | ORAL_TABLET | Freq: Three times a day (TID) | ORAL | 0 refills | Status: DC

## 2018-09-29 MED ORDER — NAPROXEN 500 MG PO TABS: 500.0000 mg | ORAL_TABLET | Freq: Two times a day (BID) | ORAL | 0 refills | Status: AC

## 2018-09-29 MED ORDER — FLUCONAZOLE 150 MG PO TABS: 150.0000 mg | ORAL_TABLET | ORAL | 0 refills | Status: DC

## 2018-09-29 NOTE — Progress Notes (Signed)
Subjective: Chief Complaint  Patient presents with  . vaginal irritation    HPI: Lisa Marshall is a 23 y.o. presenting to clinic today to discuss the following:  Vaginal Swelling and Itching Patient states she had her genital region waxed about 3 days ago and "it felt like they ripped some skin off". She had almost immediate pain and burning for which she applied ice without relief. She is having difficulty walking due to the pain which is constant, burning. She also developed some vaginal itching a few days after. She has no discharge or odor. She has no new sexual partner in over 1 year but does have unprotected sex with her partner. She does endorse pain and burning when she pees.  No fever, chills, abdominal pain, but has had nausea, vomiting x 1.   Health Maintenance: HgbA1c     ROS noted in HPI.   Past Medical, Surgical, Social, and Family History Reviewed & Updated per EMR.   Pertinent Historical Findings include:   Social History   Tobacco Use  Smoking Status Never Smoker  Smokeless Tobacco Never Used    Objective: BP 118/70   Pulse (!) 124   Temp 99.7 F (37.6 C) (Oral)   Wt 262 lb (118.8 kg)   SpO2 98%   BMI 46.41 kg/m  Vitals and nursing notes reviewed  Physical Exam Constitutional:      Appearance: Normal appearance. She is obese.  Cardiovascular:     Rate and Rhythm: Normal rate and regular rhythm.  Pulmonary:     Effort: Pulmonary effort is normal.     Breath sounds: Normal breath sounds.  Genitourinary:    General: Normal vulva.     Labia:        Right: Tenderness present. No rash or lesion.        Left: Tenderness present. No rash or lesion.      Comments: Small area of skin peeling at the top of the labia and at the bottom right. White, clumpy, frothy discharge. No malodor.    Results for orders placed or performed in visit on 09/29/18 (from the past 72 hour(s))  POCT Wet Prep [facility] D[facility]([place] [place])     Status: Abnormal   Collection Time:  09/29/18  9:00 AM  Result Value Ref Range   Source Wet Prep POC VAG    WBC, Wet Prep HPF POC 5-10    Bacteria Wet Prep HPF POC Many (A) Few   Clue Cells Wet Prep HPF POC None None   Clue Cells Wet Prep Whiff POC Negative Whiff    Yeast Wet Prep HPF POC Moderate (A) None   Trichomonas Wet Prep HPF POC Absent Absent    Assessment/Plan:  No problem-specific Assessment & Plan notes found for this encounter.   PATIENT EDUCATION PROVIDED: See AVS    Diagnosis and plan along with any newly prescribed medication(s) were discussed in detail with this patient today. The patient verbalized understanding and agreed with the plan. Patient advised if symptoms worsen return to clinic or ER.   Health Maintainance: HgbA1c   Orders Placed This Encounter  Procedures  . HgB A1c  . POCT Wet Prep [facility] [place])    Meds ordered this encounter  Medications  . fluconazole (DIFLUCAN) 150 MG tablet    Sig: Take 1 tablet (150 mg total) by mouth every 3 (three) days for 4 doses.    Dispense:  4 tablet    Refill:  0  . metroNIDAZOLE (FLAGYL)  500 MG tablet    Sig: Take 1 tablet (500 mg total) by mouth 3 (three) times daily.    Dispense:  21 tablet    Refill:  0  . naproxen (NAPROSYN) 500 MG tablet    Sig: Take 1 tablet (500 mg total) by mouth 2 (two) times daily with a meal for 5 days.    Dispense:  10 tablet    Refill:  0     Jules Schickim Durk Carmen, DO 09/29/2018, 8:46 AM PGY-2 [facility]

## 2018-09-29 NOTE — Patient Instructions (Signed)
It was great to see you today! Thank you for letting me participate in your care!  Today, we discussed your pain and discomfort. I have sent in three prescriptions. Naproxen for pain to take twice a day for 5 days. I have also sent in Metronidazole to treat BV.   The fluconazole pill will treat your yeast infection. Please take ONE pill every 3 days. Call me with questions!  Be well, Jules Schickim Brenly Trawick, DO PGY-2, [facility]

## 2018-09-30 LAB — CERVICOVAGINAL ANCILLARY ONLY
Chlamydia: NEGATIVE
NEISSERIA GONORRHEA: NEGATIVE

## 2018-10-02 ENCOUNTER — Encounter: Payer: Self-pay | Admitting: Family Medicine

## 2018-10-02 ENCOUNTER — Ambulatory Visit (INDEPENDENT_AMBULATORY_CARE_PROVIDER_SITE_OTHER): Payer: BLUE CROSS/BLUE SHIELD | Admitting: Family Medicine

## 2018-10-02 ENCOUNTER — Encounter: Payer: Self-pay | Admitting: *Deleted

## 2018-10-02 ENCOUNTER — Other Ambulatory Visit: Payer: Self-pay

## 2018-10-02 VITALS — BP 120/70 | HR 107 | Temp 98.1°F | Ht 63.0 in | Wt 261.0 lb

## 2018-10-02 DIAGNOSIS — N898 Other specified noninflammatory disorders of vagina: Secondary | ICD-10-CM

## 2018-10-02 DIAGNOSIS — B379 Candidiasis, unspecified: Secondary | ICD-10-CM

## 2018-10-02 DIAGNOSIS — E1140 Type 2 diabetes mellitus with diabetic neuropathy, unspecified: Secondary | ICD-10-CM

## 2018-10-02 DIAGNOSIS — N9089 Other specified noninflammatory disorders of vulva and perineum: Secondary | ICD-10-CM

## 2018-10-02 DIAGNOSIS — E1165 Type 2 diabetes mellitus with hyperglycemia: Secondary | ICD-10-CM

## 2018-10-02 HISTORY — DX: Candidiasis, unspecified: B37.9

## 2018-10-02 LAB — POCT GLYCOSYLATED HEMOGLOBIN (HGB A1C): HbA1c, POC (controlled diabetic range): 12 % — AB (ref 0.0–7.0)

## 2018-10-02 MED ORDER — LIDOCAINE 5 % EX OINT: 1.0000 "application " | TOPICAL_OINTMENT | Freq: Three times a day (TID) | CUTANEOUS | 0 refills | Status: AC | PRN

## 2018-10-02 MED ORDER — VALACYCLOVIR HCL 1 G PO TABS: 1000.0000 mg | ORAL_TABLET | Freq: Two times a day (BID) | ORAL | 0 refills | Status: AC

## 2018-10-02 NOTE — Assessment & Plan Note (Signed)
Looks like herpes outbreak. Unclear if this is related to her waxing of the area. D/C Flagyl since she does not have BV. Recent GC/Chlamydia checked during last visit were negative. Swab for HSV today. Empirically treat with Valtrex and topical Xylocaine. F/U soon if symptoms worsen. She agreed with the plan.

## 2018-10-02 NOTE — Assessment & Plan Note (Signed)
Patient had a negative whiff test and no clue cells however, during her visit I believe I read the results wrong and thought she tested positive for clue cells. Will contact the patient and inform her she can discontinue Metronidazole.

## 2018-10-02 NOTE — Assessment & Plan Note (Signed)
Minor skin abrasions most likely from waxing. - Naproxen 500mg  BID as needed for 5 days. - Ice and heating pads as needed

## 2018-10-02 NOTE — Assessment & Plan Note (Signed)
A1C had not improved a lot. Although her compliance is not 100%, I feel she need medication adjustment at this point. Increase Lantus to 15 units qd. Continue Trulicity, Metformin and Glipizide at current dose. Continue home CBG check. F/U in 2 weeks for reassessment.

## 2018-10-02 NOTE — Patient Instructions (Addendum)
Please, increase your Lantus to 15 units daily. Continue other medicine. I will see you back in 2 weeks.  Genital Herpes Genital herpes is a common sexually transmitted infection (STI) that is caused by a virus. The virus spreads from person to person through sexual contact. Infection can cause itching, blisters, and sores around the genitals or rectum. Symptoms may last several days and then go away This is called an outbreak. However, the virus remains in your body, so you may have more outbreaks in the future. The time between outbreaks varies and can be months or years. Genital herpes affects men and women. It is particularly concerning for pregnant women because the virus can be passed to the baby during delivery and can cause serious problems. Genital herpes is also a concern for people who have a weak disease-fighting (immune) system. What are the causes? This condition is caused by the herpes simplex virus (HSV) type 1 or type 2. The virus may spread through:  Sexual contact with an infected person, including vaginal, anal, and oral sex.  Contact with fluid from a herpes sore.  The skin. This means that you can get herpes from an infected partner even if he or she does not have a visible sore or does not know that he or she is infected. What increases the risk? You are more likely to develop this condition if:  You have sex with many partners.  You do not use latex condoms during sex. What are the signs or symptoms? Most people do not have symptoms (asymptomatic) or have mild symptoms that may be mistaken for other skin problems. Symptoms may include:  Small red bumps near the genitals, rectum, or mouth. These bumps turn into blisters and then turn into sores.  Flu-like symptoms, including: ? Fever. ? Body aches. ? Swollen lymph nodes. ? Headache.  Painful urination.  Pain and itching in the genital area or rectal area.  Vaginal discharge.  Tingling or shooting pain in the  legs and buttocks. Generally, symptoms are more severe and last longer during the first (primary) outbreak. Flu-like symptoms are also more common during the primary outbreak. How is this diagnosed? Genital herpes may be diagnosed based on:  A physical exam.  Your medical history.  Blood tests.  A test of a fluid sample (culture) from an open sore. How is this treated? There is no cure for this condition, but treatment with antiviral medicines that are taken by mouth (orally) can do the following:  Speed up healing and relieve symptoms.  Help to reduce the spread of the virus to sexual partners.  Limit the chance of future outbreaks, or make future outbreaks shorter.  Lessen symptoms of future outbreaks. Your health care provider may also recommend pain relief medicines, such as aspirin or ibuprofen. Follow these instructions at home: Sexual activity  Do not have sexual contact during active outbreaks.  Practice safe sex. Latex condoms and female condoms may help prevent the spread of the herpes virus. General instructions  Keep the affected areas dry and clean.  Take over-the-counter and prescription medicines only as told by your health care provider.  Avoid rubbing or touching blisters and sores. If you do touch blisters or sores: ? Wash your hands thoroughly with soap and water. ? Do not touch your eyes afterward.  To help relieve pain or itching, you may take the following actions as directed by your health care provider: ? Apply a cold, wet cloth (cold compress) to affected areas 4-6  times a day. ? Apply a substance that protects your skin and reduces bleeding (astringent). ? Apply a gel that helps relieve pain around sores (lidocaine gel). ? Take a warm, shallow bath that cleans the genital area (sitz bath).  Keep all follow-up visits as told by your health care provider. This is important. How is this prevented?  Use condoms. Although anyone can get genital  herpes during sexual contact, even with the use of a condom, a condom can provide some protection.  Avoid having multiple sexual partners.  Talk with your sexual partner about any symptoms either of you may have. Also, talk with your partner about any history of STIs.  Get tested for STIs before you have sex. Ask your partner to do the same.  Do not have sexual contact if you have symptoms of genital herpes. Contact a health care provider if:  Your symptoms are not improving with medicine.  Your symptoms return.  You have new symptoms.  You have a fever.  You have abdominal pain.  You have redness, swelling, or pain in your eye.  You notice new sores on other parts of your body.  You are a woman and experience bleeding between menstrual periods.  You have had herpes and you become pregnant or plan to become pregnant. Summary  Genital herpes is a common sexually transmitted infection (STI) that is caused by the herpes simplex virus (HSV) type 1 or type 2.  These viruses are most often spread through sexual contact with an infected person.  You are more likely to develop this condition if you have sex with many partners or you have unprotected sex.  Most people do not have symptoms (asymptomatic) or have mild symptoms that may be mistaken for other skin problems. Symptoms occur as outbreaks that may happen months or years apart.  There is no cure for this condition, but treatment with oral antiviral medicines can reduce symptoms, reduce the chance of spreading the virus to a partner, prevent future outbreaks, or shorten future outbreaks. This information is not intended to replace advice given to you by your health care provider. Make sure you discuss any questions you have with your health care provider. Document Released: 08/09/2000 Document Revised: 07/12/2016 Document Reviewed: 07/12/2016 Elsevier Interactive Patient Education  2019 [employer]

## 2018-10-02 NOTE — Assessment & Plan Note (Signed)
Patient had whitish, frothy, clumpy discharge on exam and positive for yeast on wet prep. Patient stated in the past she has needed 3-4 pills of diflucan to clear yeast infection as she has poorly controlled T2DM. - Diflucan 150mg  once every 3-4 days up to 4 times until symptoms resolve.

## 2018-10-02 NOTE — Progress Notes (Addendum)
hsv Subjective:     Patient ID: Lisa Marshall, female   DOB: May 12, 1996, 23 y.o.   MRN: [id number]  HPI DM2: Missed one dose of Trulicity, otherwise she has been compliant with other medication.  Vagina lesion: Here to f/u from her most recent visit. She stated that she can in for a lesion on a vulva and discharge s/p waxing. She was diagnosed with BV and yeast infection. She was also sent home with pain medicine. She completed her Diflucan, still have Metronidazole. However, she feels that her Metronidazole has worsened her vulva lesion which has now spread and is very pain. It burns with urination and it is difficult for her to walk.  Current Outpatient Medications on File Prior to Visit  Medication Sig Dispense Refill  . albuterol (PROVENTIL HFA;VENTOLIN HFA) 108 (90 Base) MCG/ACT inhaler Inhale 2 puffs into the lungs every 6 (six) hours as needed for wheezing or shortness of breath. (Patient not taking: Reported on 09/15/2018) 18 g 2  . Ascorbic Acid (VITAMIN C PO) Take 1 tablet by mouth daily.    . BD PEN NEEDLE NANO U/F 32G X 4 MM MISC INJECT 10 UNITS DAILY AT 10PM 100 each 3  . Dulaglutide 1.5 MG/0.5ML SOPN INJECT 1.5 MG INTO THE SKIN ONCE A WEEK. 2 pen 4  . DULoxetine (CYMBALTA) 20 MG capsule Take 1 capsule (20 mg total) by mouth 2 (two) times daily. 60 capsule 1  . fluconazole (DIFLUCAN) 150 MG tablet Take 1 tablet (150 mg total) by mouth every 3 (three) days for 4 doses. 4 tablet 0  . gabapentin (NEURONTIN) 300 MG capsule Take 2 capsules (600 mg total) by mouth 3 (three) times daily for 30 days. 180 capsule 0  . glipiZIDE (GLUCOTROL) 10 MG tablet Take 1 tablet (10 mg total) by mouth daily. 90 tablet 1  . Insulin Glargine (LANTUS SOLOSTAR) 100 UNIT/ML Solostar Pen Inject 10 Units into the skin daily. 15 pen 3  . Lido-Capsaicin-Men-Methyl Sal 0.5-0.035-5-20 % PTCH Apply 1 patch topically daily. You may leave in place for 12-24 hours 5 patch 0  . MedroxyPROGESTERone Acetate  (DEPO-PROVERA IM) Inject into the muscle every 3 (three) months. Last injection 1st part of August 2018    . metFORMIN (GLUCOPHAGE) 500 MG tablet TAKE 2 TABLETS BY MOUTH TWICE DAILY WITH FOOD 180 tablet 2  . metroNIDAZOLE (FLAGYL) 500 MG tablet Take 1 tablet (500 mg total) by mouth 3 (three) times daily. 21 tablet 0  . naproxen (NAPROSYN) 500 MG tablet Take 1 tablet (500 mg total) by mouth 2 (two) times daily with a meal for 5 days. 10 tablet 0  . nystatin (NYSTATIN) powder Apply to breast creases when you have rash twice a day (Patient not taking: Reported on 08/05/2018) 60 g 0  . Omega-3 Fatty Acids (FISH OIL) 1000 MG CAPS Take 1,000 capsules by mouth 2 (two) times daily.      No current facility-administered medications on file prior to visit.    Past Medical History:  Diagnosis Date  . Asthma   . Depression   . Diabetic neuropathy (HCC)   . Encounter for surveillance of injectable contraceptive 12/23/2014  . History of PID   . Paronychia of great toe of left foot 10/03/2017  . Pregnancy, multiple   . Syncope   . Vaginal bleeding 05/30/2017     Review of Systems  Respiratory: Negative.   Cardiovascular: Negative.   Gastrointestinal: Negative.   Genitourinary: Positive for genital sores.  All other  systems reviewed and are negative.      Objective:   Physical Exam Vitals signs and nursing note reviewed. Exam conducted with a chaperone present Barrister's clerk(Tashira Leggette. ).  Neck:     Musculoskeletal: Normal range of motion and neck supple.  Cardiovascular:     Rate and Rhythm: Regular rhythm. Tachycardia present.     Pulses: Normal pulses.     Heart sounds: No murmur.  Pulmonary:     Effort: Pulmonary effort is normal. No respiratory distress.     Breath sounds: Normal breath sounds. No rhonchi.  Abdominal:     General: Abdomen is flat. Bowel sounds are normal. There is no distension.     Palpations: Abdomen is soft. There is no mass.     Tenderness: There is no abdominal  tenderness.  Genitourinary:    Exam position: Lithotomy position.     Labia:        Right: Lesion present.        Left: Lesion present.      Comments: Multiple painful shallow ulcers on her labia minora and majora. No discharge on the outside. Speculum/internal exam not completed due to pain. Neurological:     Mental Status: She is alert.        Assessment:     DM2 Vulva lesion    Plan:     Check problem list.

## 2018-10-05 ENCOUNTER — Telehealth: Payer: Self-pay | Admitting: *Deleted

## 2018-10-05 NOTE — Telephone Encounter (Signed)
Please let her know her culture results are not back yet.  We will notify her when they return - likely a few days  Thanks  LC

## 2018-10-05 NOTE — Telephone Encounter (Signed)
Pt calling for results of labwork. Lisa Marshall, Lisa Marshall, CMA

## 2018-10-06 ENCOUNTER — Telehealth: Payer: Self-pay | Admitting: Family Medicine

## 2018-10-06 NOTE — Telephone Encounter (Signed)
Pt informed.  She is very anxious, advised that we would call once we receive the results. Lisa Marshall, Lisa RochesterJessica Marshall, CMA

## 2018-10-06 NOTE — Telephone Encounter (Signed)
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  Verified that patient section of form has been completed.  Last DOS/WCC with PCP was 10/02/2018.  Placed form in team folder to be completed by clinical staff.  Herma Mering Magtoto

## 2018-10-07 NOTE — Telephone Encounter (Signed)
Clinical info completed on Short Term Disability form.  Place form in PCP's box for completion.  Aaylah Pokorny, CMA   

## 2018-10-09 LAB — SPECIMEN STATUS REPORT

## 2018-10-09 NOTE — Telephone Encounter (Signed)
I will able to complete it hen I return next week. You can also check if Dr. Deirdre Priest an complete the form. Thanks.

## 2018-10-09 NOTE — Telephone Encounter (Signed)
Pt calling again about HSV results. Per Molly Maduro, he believes the test will be back on Monday. I informed patient of this and apologized for the lengthily wait time, but informed her this was standard for the typing of testing. Pt understood.

## 2018-10-12 ENCOUNTER — Encounter: Payer: Self-pay | Admitting: Family Medicine

## 2018-10-12 NOTE — Telephone Encounter (Signed)
Patient has called x 2 today. Informed her we would call as soon as the results are available.  Ples Specter, RN [facility] RN)

## 2018-10-12 NOTE — Telephone Encounter (Signed)
I called her to clarify why she is needing short term disability. I recently completed FMLA form for her. However, she stated that she had been unable to fully return to work since Feb 1st due to severe DM neuropathy.  She had not completely complete her form and I will like to reassess her prior to completing this form.  Note that her vaginal lesion has resolved.

## 2018-10-12 NOTE — Progress Notes (Signed)
1  Lisa Marshall Female, 23 y.o., 01/23/1996 MRN:  [id number] Phone:  [phone] (H) ... PCP:  Me Primary Cvg:  CIGNA/CIGNA MANAGED Next Appt With Podiatry 10/14/2018 at 9:30 AM  Message  Received: Today  Message Contents  Busick, Doris Cheadle, CMA  Doreene Eland, MD        I have a copy of the report, it was positive. Trying to work on it crossing over into epic.   Previous Messages    ----- Message -----  From: Doreene Eland, MD  Sent: 10/12/2018 12:11 PM EST  To: Jennette Bill, CMA   Please help me check on the HSV result for this patient. It has been more than 1 week I ordered it. Thanks.

## 2018-10-13 ENCOUNTER — Telehealth: Payer: Self-pay | Admitting: Family Medicine

## 2018-10-13 ENCOUNTER — Encounter: Payer: Self-pay | Admitting: Family Medicine

## 2018-10-13 DIAGNOSIS — A6009 Herpesviral infection of other urogenital tract: Secondary | ICD-10-CM | POA: Insufficient documentation

## 2018-10-13 LAB — HERPES SIMPLEX VIRUS CULTURE

## 2018-10-13 NOTE — Telephone Encounter (Signed)
I called both numbers listed on file for her. The first number's voice mail is full. I left a HIPAA compliant callback message on her second number.   Whenever she calls, please let her know that her herpes test came back positive per preliminary report. No further action, since she has been treated.  Always use protection/condoms for sex.

## 2018-10-13 NOTE — Telephone Encounter (Signed)
LVM for patient to return call about her short term disability. See below. Pt needs to fill out her part and be seen by Eniola. Please inform her of this and schedule her. Thanks! Forms are back up front for her pick up.

## 2018-10-13 NOTE — Telephone Encounter (Signed)
Patient returned call. Results given. Patient would like PCP to call her again for additional questions she has.  Call back is [phone].  Ples Specter, RN [facility] RN)

## 2018-10-13 NOTE — Telephone Encounter (Signed)
I spoke with patient. She has so many questions about HSV. I was able to answer all her questions to the best of my knowledge.  The report I have her shows that she has HSV type 2.  Since this is the first episode, she does not need suppressive therapy. She verbalized understanding. F/U as needed.

## 2018-10-14 ENCOUNTER — Encounter: Payer: Self-pay | Admitting: Podiatry

## 2018-10-14 ENCOUNTER — Ambulatory Visit (INDEPENDENT_AMBULATORY_CARE_PROVIDER_SITE_OTHER): Payer: BLUE CROSS/BLUE SHIELD | Admitting: Podiatry

## 2018-10-14 VITALS — BP 108/71 | HR 95

## 2018-10-14 DIAGNOSIS — E1142 Type 2 diabetes mellitus with diabetic polyneuropathy: Secondary | ICD-10-CM

## 2018-10-14 DIAGNOSIS — Q828 Other specified congenital malformations of skin: Secondary | ICD-10-CM | POA: Diagnosis not present

## 2018-10-14 NOTE — Progress Notes (Signed)
This patient presents to the office with chief complaint of painful callus  and diabetic feet.  This patient  says there  is   pain and discomfort in her callus.   This callus is a painful walking and wearing shoes.  Patient has no history of infection or drainage from both feet.  She was referred to this office by her doctor, Dr.  Lum Babe. . This patient presents  to the office today for treatment of her callus  and a foot evaluation due to history of  diabetes.  General Appearance  Alert, conversant and in no acute stress.  Vascular  Dorsalis pedis and posterior tibial  pulses are palpable  bilaterally.  Capillary return is within normal limits  bilaterally. Temperature is within normal limits  bilaterally.  Neurologic  Senn-Weinstein monofilament wire test within normal limits/diminished   bilaterally. Muscle power within normal limits bilaterally.  Nails Normal nails noted with no evidence of thickness or disfigurment.   No evidence of bacterial infection or drainage bilaterally.  Orthopedic  No limitations of motion of motion feet .  No crepitus or effusions noted.  No bony pathology or digital deformities noted.  Skin  normotropic skin  bilaterally.  No signs of infections or ulcers noted.  Porokeratosis sub 3 right foot.   Porokeratosis sub 3 right foot.  Diabetes with no foot complications  IE  Debride nails x 10.  A diabetic foot exam was performed and there is no evidence of any vascular or neurologic pathology.   RTC 1 year for annual diabetic foot exam.   Helane Gunther DPM

## 2018-10-14 NOTE — Telephone Encounter (Signed)
LVM on pts home number. Informed her that her Short Term Disability paper has a section that needs to be filled out by her, and also Dr. Lum Babe wants her to make an appt. To see her.

## 2018-10-15 NOTE — Telephone Encounter (Signed)
Attempted to call pt again. LVM stating that her Short Term Disability papers need to be sign by her before they can be completed, I also stated that Dr. Lum Babe would like for her to make an appt with her as well. Aquilla Solian, CMA

## 2018-10-16 NOTE — Telephone Encounter (Signed)
Attempted to call pt. Lvm to call office back. Aquilla Solian, CMA

## 2018-10-20 NOTE — Telephone Encounter (Signed)
Pt called about the short term disability papers stating her job has note received them. Notified pt of the information below and that we have tried to contact her multiple times over the past week or so. Pt scheduled an appt with Eniola for 3/10. Pt wasn't happy with how far out this appt was but told her it was the first available for Eniola.

## 2018-10-21 NOTE — Telephone Encounter (Signed)
Got more info from Amy at the front. She said when she spoke with the pt. The pt said that she would just wait until her appt. On 3/10 with Eniola to get her short term Disability forms. Aquilla Solian, CMA

## 2018-10-26 ENCOUNTER — Ambulatory Visit (INDEPENDENT_AMBULATORY_CARE_PROVIDER_SITE_OTHER): Payer: BLUE CROSS/BLUE SHIELD | Admitting: Family Medicine

## 2018-10-26 ENCOUNTER — Other Ambulatory Visit (HOSPITAL_COMMUNITY)
Admission: RE | Admit: 2018-10-26 | Discharge: 2018-10-26 | Disposition: A | Discharge status: Home / Self Care | Payer: BLUE CROSS/BLUE SHIELD | Source: Ambulatory Visit | Attending: Family Medicine | Admitting: Family Medicine

## 2018-10-26 ENCOUNTER — Encounter: Payer: Self-pay | Admitting: Family Medicine

## 2018-10-26 VITALS — BP 110/72 | HR 108 | Temp 98.3°F | Ht 63.0 in | Wt 266.2 lb

## 2018-10-26 DIAGNOSIS — B379 Candidiasis, unspecified: Secondary | ICD-10-CM

## 2018-10-26 DIAGNOSIS — N898 Other specified noninflammatory disorders of vagina: Secondary | ICD-10-CM | POA: Diagnosis not present

## 2018-10-26 DIAGNOSIS — F418 Other specified anxiety disorders: Secondary | ICD-10-CM

## 2018-10-26 DIAGNOSIS — E1140 Type 2 diabetes mellitus with diabetic neuropathy, unspecified: Secondary | ICD-10-CM | POA: Diagnosis not present

## 2018-10-26 DIAGNOSIS — E1165 Type 2 diabetes mellitus with hyperglycemia: Secondary | ICD-10-CM

## 2018-10-26 DIAGNOSIS — F39 Unspecified mood [affective] disorder: Secondary | ICD-10-CM

## 2018-10-26 LAB — POCT WET PREP (WET MOUNT)
Clue Cells Wet Prep Whiff POC: NEGATIVE
TRICHOMONAS WET PREP HPF POC: ABSENT

## 2018-10-26 MED ORDER — FLUCONAZOLE 150 MG PO TABS: 150.0000 mg | ORAL_TABLET | ORAL | 0 refills | Status: DC

## 2018-10-26 MED ORDER — DULOXETINE HCL 20 MG PO CPEP: 20.0000 mg | ORAL_CAPSULE | Freq: Two times a day (BID) | ORAL | 1 refills | Status: DC

## 2018-10-26 NOTE — Patient Instructions (Signed)
It was wonderful to see you today.  Thank you for choosing [facility].   Please call [phone] with any questions about today's appointment.  Please be sure to schedule follow up at the front  desk before you leave today.   Williams Dietrick, MD  Family Medicine    

## 2018-10-26 NOTE — Assessment & Plan Note (Signed)
Short term disability paperwork completed  Follow up with Dr. Lum Babe as scheduled

## 2018-10-26 NOTE — Progress Notes (Signed)
Patient Name: Lisa Marshall Date of Birth: Feb 03, 1996 Date of Visit: 10/26/18 PCP: Doreene Eland, MD  Chief Complaint: forms and leg pain   Subjective: Lisa Marshall is a pleasant 23 y.o. with history significant for diabetes complicated by hyperglycemia and severe neuropathy, morbid obesity, depression and asthma presenting today for forms completion.  The patient has had type 2 diabetes since 2018.  Her diabetes has been poorly controlled (A1C >12). She has developed severe, neuropathic pain in her lower extremities. Evaluation for other cause has been unremarkable. She has seen Podiatry. She is scheduled for a Neurology evaluation next week. She reports the cold air of her job causes her neuropathy to flare. 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 She works 4 12 hour shifts. She last worked in early January (09/04/2018).  Dates of OV in Family Medicine: 07/07/2018- seen for diabetes  07/22/2018: MRI ordered, Cymbalta started  08/05/2018- Follow up treatment of tinea pedis 09/03/2018- Gabapentin increased  09/15/2018- titration of diabetes medication 09/29/2018- acute visit for vaginal issue 10/02/2018- treated for HSV, to follow up in 2 weeks   Her MRI is scheduled for this week. She reports continued bilateral leg pain, improved with gabapentin. She reports some leg weakness. She ambulates without issue to the bathroom today. She reports she now requires assistance at home to cook, clean, and bath herself. She is able to dress, drive, shop, and toilet.   The patient reports her mood is poor due to her diabetes and pain. She reports Cymbalta has helped a bit. She reports low mood and poor sleep. No SI/HI. Binge eating has improved.  The patient reports a 1-2 day history of thick, white vaginal discharge. No pelvic pain, dysuria, hematuria, nausea, vomiting, or flank pain.    ROS: Denies fevers, incontinence, SI/HI.  ROS  I have reviewed  the patient's medical, surgical, family, and social history as appropriate.   Vitals:   10/26/18 0953  BP: 110/72  Pulse: (!) 108  Temp: 98.3 F (36.8 C)  SpO2: 97%   Filed Weights   10/26/18 0953  Weight: 266 lb 3.2 oz (120.7 kg)   HEENT: Sclera anicteric. Dentition is moderate. Appears well hydrated. Obese, pleasant but tearful  Cardiac: Warm well perfused.  Capillary refill less than 3 seconds Respiratory breathing comfortably on room air Psych: Pleasant normal affect, appropriate, normal rate of speech GU Exam:  Chaperoned exam.  External exam: Small healing ulcer on right labia majora, <0.5 cm in size.  Vaginal exam notable for white discharge, no odor.  Cervix without discharge or obvious lesion.   Diagnoses and all orders for this visit:  Poorly controlled type 2 diabetes mellitus with neuropathy (HCC), continue gabapentin. Could consider addition of TCA in future.   Yeast infection -     fluconazole (DIFLUCAN) 150 MG tablet; Take 1 tablet (150 mg total) by mouth every 3 (three) days. -     POCT Wet Prep [facility])  Vaginal discharge -     Cervicovaginal ancillary only  HSV, discussed at length. Patient requested a test of cure today. Discussed that lesions are healing. Reviewed biology, pathophysiology, treatment and prevention of spread.   Depression with anxiety  -     DULoxetine (CYMBALTA) 20 MG capsule; Take 1 capsule (20 mg total) by mouth 2 (two) times daily.  Form Completion, short term disability paperwork completed. The patient appears to have overlying anxiety worsening her pain symptoms. Her job description changed during the  encounter---at first, she reported she was on her feet 12 hours per day. Her more recent description is that she has 3 15 minute breaks and her job includes walking, typing, and sitting. We agreed to the following plan: She would return to work on 10/30/2018 with 4 20 minute breaks and a lunch break.   Terisa Starr, MD  Family Medicine  Teaching Service

## 2018-10-26 NOTE — Assessment & Plan Note (Signed)
Refilled Cymbalta--she finds this helps.

## 2018-10-27 LAB — CERVICOVAGINAL ANCILLARY ONLY
Chlamydia: NEGATIVE
Neisseria Gonorrhea: NEGATIVE

## 2018-10-28 ENCOUNTER — Other Ambulatory Visit: Payer: Self-pay | Admitting: *Deleted

## 2018-10-28 ENCOUNTER — Ambulatory Visit (HOSPITAL_COMMUNITY)
Admission: RE | Admit: 2018-10-28 | Discharge: 2018-10-28 | Disposition: A | Discharge status: Home / Self Care | Payer: Managed Care, Other (non HMO) | Source: Ambulatory Visit | Attending: Family Medicine | Admitting: Family Medicine

## 2018-10-28 DIAGNOSIS — R29898 Other symptoms and signs involving the musculoskeletal system: Secondary | ICD-10-CM | POA: Insufficient documentation

## 2018-10-28 DIAGNOSIS — R200 Anesthesia of skin: Secondary | ICD-10-CM | POA: Diagnosis not present

## 2018-10-28 DIAGNOSIS — M5410 Radiculopathy, site unspecified: Secondary | ICD-10-CM | POA: Diagnosis not present

## 2018-10-28 MED ORDER — FREESTYLE FLASH SYSTEM KIT: PACK | 0 refills | Status: AC

## 2018-10-28 NOTE — Telephone Encounter (Signed)
Patient informed of results and voiced understanding.  She would like a script sent to the pharmacy for a Flash meter.  [name] [name],CMA

## 2018-10-28 NOTE — Telephone Encounter (Signed)
-----   Message from Ellwood Dense, DO sent at 10/28/2018  2:20 PM EST ----- Please let patient know her MRI results are normal and do not show structural abnormalities that would cause her foot/leg pain and numbness.

## 2018-10-29 ENCOUNTER — Telehealth: Payer: Self-pay | Admitting: Family Medicine

## 2018-10-29 DIAGNOSIS — E1165 Type 2 diabetes mellitus with hyperglycemia: Principal | ICD-10-CM

## 2018-10-29 DIAGNOSIS — E1140 Type 2 diabetes mellitus with diabetic neuropathy, unspecified: Secondary | ICD-10-CM

## 2018-10-29 NOTE — Telephone Encounter (Signed)
Attempted to call patient. Left generic voicemail.   Nursing- please let patient know her testing for infection (Gonorrhea and chlamydia) was negative.   Terisa Starr, MD  Family Medicine Teaching Service

## 2018-10-29 NOTE — Telephone Encounter (Signed)
Pt informed. She would like a dexcon flash meter called in. She said it is covered by insurance either g5 or G6. Please advise. Deseree Bruna Potter, CMA

## 2018-10-30 MED ORDER — DEXCOM G6 RECEIVER DEVI: 1.0000 | 1 refills | Status: DC

## 2018-10-30 NOTE — Telephone Encounter (Signed)
Called patient to confirm number of injections per day and number of BG checks per day. Injects once per day (lantus), BG checks four times per day. Rx for G6 sent to pharmacy.   Terisa Starr, MD  Family Medicine Teaching Service

## 2018-11-02 ENCOUNTER — Encounter: Payer: Self-pay | Admitting: Family Medicine

## 2018-11-03 ENCOUNTER — Encounter: Payer: Self-pay | Admitting: Family Medicine

## 2018-11-03 ENCOUNTER — Other Ambulatory Visit: Payer: Self-pay

## 2018-11-03 ENCOUNTER — Other Ambulatory Visit: Payer: Self-pay | Admitting: Family Medicine

## 2018-11-03 ENCOUNTER — Ambulatory Visit (INDEPENDENT_AMBULATORY_CARE_PROVIDER_SITE_OTHER): Payer: BLUE CROSS/BLUE SHIELD | Admitting: Family Medicine

## 2018-11-03 DIAGNOSIS — F418 Other specified anxiety disorders: Secondary | ICD-10-CM | POA: Diagnosis not present

## 2018-11-03 DIAGNOSIS — E1140 Type 2 diabetes mellitus with diabetic neuropathy, unspecified: Secondary | ICD-10-CM | POA: Diagnosis not present

## 2018-11-03 DIAGNOSIS — E1165 Type 2 diabetes mellitus with hyperglycemia: Secondary | ICD-10-CM

## 2018-11-03 MED ORDER — DULOXETINE HCL 60 MG PO CPEP: 60.0000 mg | ORAL_CAPSULE | Freq: Every day | ORAL | 1 refills | Status: DC

## 2018-11-03 MED ORDER — DEXCOM G6 TRANSMITTER MISC: 1.0000 | Freq: Three times a day (TID) | 1 refills | Status: DC

## 2018-11-03 MED ORDER — DEXCOM G6 SENSOR MISC: 1.0000 | Freq: Three times a day (TID) | 1 refills | Status: DC

## 2018-11-03 NOTE — Progress Notes (Signed)
Subjective:     Patient ID: Lisa Marshall, female   DOB: 02-16-1996, 23 y.o.   MRN: [id number]  HPI DM Neuropathy: She is compliant with her meds. Her CBG is running higher than 200. Her neuropathic pain is still terrible. She will like a change to her Lyrica or Cymbalta. She need refill of her glucometer.   Current Outpatient Medications on File Prior to Visit  Medication Sig Dispense Refill  . albuterol (PROVENTIL HFA;VENTOLIN HFA) 108 (90 Base) MCG/ACT inhaler Inhale 2 puffs into the lungs every 6 (six) hours as needed for wheezing or shortness of breath. 18 g 2  . Ascorbic Acid (VITAMIN C PO) Take 1 tablet by mouth daily.    . BD PEN NEEDLE NANO U/F 32G X 4 MM MISC INJECT 10 UNITS DAILY AT 10PM 100 each 3  . Blood Glucose Monitoring Suppl (FREESTYLE FLASH SYSTEM) KIT Use to check blood glucose level three times daily 1 each 0  . Continuous Blood Gluc Receiver (DEXCOM G6 RECEIVER) DEVI 1 Device by Does not apply route continuous. 1 Device 1  . Dulaglutide 1.5 MG/0.5ML SOPN INJECT 1.5 MG INTO THE SKIN ONCE A WEEK. 2 pen 4  . DULoxetine (CYMBALTA) 20 MG capsule Take 1 capsule (20 mg total) by mouth 2 (two) times daily. 60 capsule 1  . fluconazole (DIFLUCAN) 150 MG tablet Take 1 tablet (150 mg total) by mouth every 3 (three) days. 3 tablet 0  . gabapentin (NEURONTIN) 300 MG capsule Take 2 capsules (600 mg total) by mouth 3 (three) times daily for 30 days. 180 capsule 0  . glipiZIDE (GLUCOTROL) 10 MG tablet Take 1 tablet (10 mg total) by mouth daily. 90 tablet 1  . ICY HOT LIDOCAINE PLUS MENTHOL 4-1 % PTCH APPLY 1 PATCH QD. LEAVE ON FOR 12 H IN A 24 HOURS PEROID    . Insulin Glargine (LANTUS SOLOSTAR) 100 UNIT/ML Solostar Pen Inject 10 Units into the skin daily. 15 pen 3  . Lido-Capsaicin-Men-Methyl Sal 0.5-0.035-5-20 % PTCH Apply 1 patch topically daily. You may leave in place for 12-24 hours 5 patch 0  . medroxyPROGESTERone Acetate 150 MG/ML SUSY     . metFORMIN (GLUCOPHAGE) 500 MG  tablet TAKE 2 TABLETS BY MOUTH TWICE DAILY WITH FOOD 180 tablet 2  . nystatin (NYSTATIN) powder Apply to breast creases when you have rash twice a day 60 g 0  . Omega-3 Fatty Acids (FISH OIL) 1000 MG CAPS Take 1,000 capsules by mouth 2 (two) times daily.      No current facility-administered medications on file prior to visit.    Past Medical History:  Diagnosis Date  . Asthma   . Depression   . Diabetic neuropathy ([place])   . Encounter for surveillance of injectable contraceptive 12/23/2014  . History of PID   . Numbness of foot 07/22/2018  . Paronychia of great toe of left foot 10/03/2017  . Pregnancy, multiple   . Syncope   . Vaginal bleeding 05/30/2017  . Yeast infection 10/02/2018     Review of Systems  Respiratory: Negative.   Cardiovascular: Negative.   Gastrointestinal: Negative.   Musculoskeletal: Negative.   Neurological:       Paresthesia   All other systems reviewed and are negative.      Objective:   Physical Exam Vitals signs and nursing note reviewed.  Constitutional:      Appearance: Normal appearance. She is obese.  Cardiovascular:     Rate and Rhythm: Normal rate and regular rhythm.  Pulses: Normal pulses.     Heart sounds: Normal heart sounds. No murmur.  Pulmonary:     Effort: Pulmonary effort is normal. No respiratory distress.     Breath sounds: Normal breath sounds. No wheezing.  Abdominal:     General: Abdomen is flat. Bowel sounds are normal. There is no distension.     Palpations: There is no mass.     Tenderness: There is no abdominal tenderness. There is no guarding.  Musculoskeletal: Normal range of motion.     Right lower leg: No edema.     Left lower leg: No edema.  Neurological:     Mental Status: She is alert.        Assessment:     DM2 with neuropathy    Plan:     Check  Problem list.

## 2018-11-03 NOTE — Assessment & Plan Note (Addendum)
Increase Lantus from 10 units qd to 15 units QD. Continue Metformin 1000 mg BID. COntinue Trulicity at 1.5 qweekly. Keep CBG between 130- 180. F/U in 2 weeks for reassessment. I refilled her Glucometer. Increase Cymbalta to 60 mg qd for neuropathy. Continue current dose of Lyrica.

## 2018-11-03 NOTE — Patient Instructions (Signed)
Please continue current dose of Metformin. Increase Lantus to 15 units daily. Continue same dose of Trulicity. I will increase your Cymbalta.  See you in 2 week.s

## 2018-11-10 ENCOUNTER — Other Ambulatory Visit: Payer: Self-pay

## 2018-11-10 ENCOUNTER — Encounter: Payer: Self-pay | Admitting: Family Medicine

## 2018-11-10 ENCOUNTER — Ambulatory Visit (INDEPENDENT_AMBULATORY_CARE_PROVIDER_SITE_OTHER): Payer: BLUE CROSS/BLUE SHIELD | Admitting: Family Medicine

## 2018-11-10 VITALS — BP 118/72 | HR 103 | Temp 98.8°F | Ht 63.0 in | Wt 267.8 lb

## 2018-11-10 DIAGNOSIS — B009 Herpesviral infection, unspecified: Secondary | ICD-10-CM

## 2018-11-10 DIAGNOSIS — B373 Candidiasis of vulva and vagina: Secondary | ICD-10-CM

## 2018-11-10 DIAGNOSIS — B3731 Acute candidiasis of vulva and vagina: Secondary | ICD-10-CM

## 2018-11-10 DIAGNOSIS — M5410 Radiculopathy, site unspecified: Secondary | ICD-10-CM

## 2018-11-10 DIAGNOSIS — N766 Ulceration of vulva: Secondary | ICD-10-CM | POA: Diagnosis not present

## 2018-11-10 DIAGNOSIS — A6009 Herpesviral infection of other urogenital tract: Secondary | ICD-10-CM

## 2018-11-10 DIAGNOSIS — E1140 Type 2 diabetes mellitus with diabetic neuropathy, unspecified: Secondary | ICD-10-CM

## 2018-11-10 DIAGNOSIS — N898 Other specified noninflammatory disorders of vagina: Secondary | ICD-10-CM | POA: Diagnosis not present

## 2018-11-10 DIAGNOSIS — E1165 Type 2 diabetes mellitus with hyperglycemia: Secondary | ICD-10-CM

## 2018-11-10 LAB — POCT WET PREP (WET MOUNT)
Clue Cells Wet Prep Whiff POC: NEGATIVE
TRICHOMONAS WET PREP HPF POC: ABSENT

## 2018-11-10 MED ORDER — VALACYCLOVIR HCL 500 MG PO TABS: 500.0000 mg | ORAL_TABLET | Freq: Two times a day (BID) | ORAL | 3 refills | Status: DC

## 2018-11-10 MED ORDER — FLUCONAZOLE 150 MG PO TABS: 150.0000 mg | ORAL_TABLET | Freq: Once | ORAL | 0 refills | Status: AC

## 2018-11-10 NOTE — Assessment & Plan Note (Signed)
Patient continues to have pain limiting her ability to work. Her work has not yet made accommodations for her to stand/sit as needed. She requests a referral to [facility], discussed options as Cymbalta takes time to have resulting effect. The patient is certain she would like to see a pain physician. Referral placed.

## 2018-11-10 NOTE — Progress Notes (Signed)
Work accommodation request form completed. A copy made for chart and the other placed in the front office to be faxed.

## 2018-11-10 NOTE — Progress Notes (Signed)
  Patient Name: Lisa Marshall Date of Birth: [birth year] Date of Visit: 11/10/18 PCP: Doreene Eland, MD  Chief Complaint: Right labial ulcer  Subjective: Lisa Marshall is a pleasant 23 y.o. with medical history significant for with history significant for herpes simplex infection, poorly controlled type 2 diabetes, morbid obesity presenting today for right labial ulcer.  She reports this started 3 days ago as a painful bump.  This occurred after having intercourse with her partner.  She was using condoms at the time.  She denies pelvic pain or bleeding. She endorses thick discharge, similar to prior yeast infections. No itching, new rashes, new lotions. She has a prior HSV initial outbreak occurring in February.   The patient has a long standing history of diabetes, which has not been well controlled. She has missed multiple months of work due to pain. She feels her pain is not fully controlled with the current medications. She reports bilateral foot and lower extremity pain.   ROS:  Negative as above.   I have reviewed the patient's medical, surgical, family, and social history as appropriate.   Vitals:   11/10/18 1007  BP: 118/72  Pulse: (!) 103  Temp: 98.8 F (37.1 C)  SpO2: 98%   Filed Weights   11/10/18 1007  Weight: 267 lb 12.8 oz (121.5 kg)  Cardiac: Warm well perfused.  Capillary refill less than 3 seconds Respiratory breathing comfortably on room air Psych: Pleasant normal affect, appropriate, normal rate of speech GU Exam:  Chaperoned exam.  External exam:On right labia minora there is a 0.4 cm erythematous painful ulcer.  Vaginal exam notable for thick discharge.  Cervix without discharge or obvious lesion.    Altair was seen today for bump on vagina.  Diagnoses and all orders for this visit:  Vaginal discharge -     POCT Wet Prep [facility]) - Fluconazole prescribed.   Vulvar ulcer, recommended and discussed HIV and RPR testing, patient declined  today. She has had the same partner for some time. Will test for HSV, if negative, certainly needs additional testing for STI.  -     Herpes simplex virus culture  Herpes simplex infection -     valACYclovir (VALTREX) 500 MG tablet; Take 1 tablet (500 mg total) by mouth 2 (two) times daily. For three days then take one tab daily to suppress outbreaks for suppression.   Poorly controlled type 2 diabetes mellitus with neuropathy (HCC), patient has poorly controlled diabetes and resulting neuropathy already on Lyrica and Cymbalta.  Discussed management at length.  The patient is on appropriate medications for this condition.  We discussed additional options including alternative therapies such as acupuncture, massage, physical therapy.  The patient would very much like a referral to pain management clinic we discussed this and agreed that she may benefit from further alternative agents for her pain. -     Ambulatory referral to Pain Clinic  Radiculopathy of leg -     Ambulatory referral to Pain Clinic  Terisa Starr, MD  Family Medicine Teaching Service

## 2018-11-10 NOTE — Patient Instructions (Signed)
   It was wonderful to see you today.  Thank you for choosing [facility].   Please call [phone] with any questions about today's appointment.  Please be sure to schedule follow up at the front  desk before you leave today.   Terisa Starr, MD  Family Medicine   For your ulcer: Valtrex twice a day for 3 days then once daily.    For your discharge: I will call you with results

## 2018-11-10 NOTE — Assessment & Plan Note (Signed)
Patient had a second outbreak 1.5 months later (today). Started on suppressive therapy. Recommended additional STI testing.

## 2018-11-16 ENCOUNTER — Telehealth: Payer: Self-pay | Admitting: Family Medicine

## 2018-11-16 LAB — HERPES SIMPLEX VIRUS CULTURE

## 2018-11-16 MED ORDER — NYSTATIN 100000 UNIT/GM EX POWD: CUTANEOUS | 0 refills | Status: DC

## 2018-11-16 MED ORDER — METFORMIN HCL 500 MG PO TABS: ORAL_TABLET | ORAL | 2 refills | Status: DC

## 2018-11-16 NOTE — Telephone Encounter (Signed)
Called patient regarding upcoming appointment. She reports she has no acute or chronic concerns which need addressed. She has not yet heard from [facility], encouraged her to call.  Patient requests refills of Nystatin and Metformin. Refills sent. All questions were answered. I let the patient know our clinic is open, mostly providing telemedicine services.   Terisa Starr, MD  Family Medicine Teaching Service

## 2018-11-18 ENCOUNTER — Telehealth: Payer: Self-pay | Admitting: Family Medicine

## 2018-11-18 ENCOUNTER — Encounter: Payer: Self-pay | Admitting: Family Medicine

## 2018-11-18 NOTE — Telephone Encounter (Signed)
Attempted to  Call patient X2. Will send letter.   Terisa Starr, MD  Family Medicine Teaching Service

## 2018-11-20 ENCOUNTER — Ambulatory Visit: Payer: Self-pay | Admitting: Family Medicine

## 2018-12-14 ENCOUNTER — Other Ambulatory Visit: Payer: Self-pay | Admitting: Family Medicine

## 2018-12-14 NOTE — Telephone Encounter (Signed)
Pt called to see if she could get refill on her DEXCOM G6 Trasmitter. Please give pt a call back.

## 2018-12-29 ENCOUNTER — Encounter: Payer: Self-pay | Admitting: Family Medicine

## 2018-12-29 ENCOUNTER — Ambulatory Visit (INDEPENDENT_AMBULATORY_CARE_PROVIDER_SITE_OTHER): Payer: BLUE CROSS/BLUE SHIELD | Admitting: Family Medicine

## 2018-12-29 ENCOUNTER — Other Ambulatory Visit: Payer: Self-pay

## 2018-12-29 VITALS — BP 110/64 | HR 121 | Ht 63.0 in | Wt 268.4 lb

## 2018-12-29 DIAGNOSIS — F39 Unspecified mood [affective] disorder: Secondary | ICD-10-CM | POA: Diagnosis not present

## 2018-12-29 DIAGNOSIS — A6009 Herpesviral infection of other urogenital tract: Secondary | ICD-10-CM

## 2018-12-29 DIAGNOSIS — R000 Tachycardia, unspecified: Secondary | ICD-10-CM

## 2018-12-29 DIAGNOSIS — N760 Acute vaginitis: Secondary | ICD-10-CM

## 2018-12-29 DIAGNOSIS — E1140 Type 2 diabetes mellitus with diabetic neuropathy, unspecified: Secondary | ICD-10-CM | POA: Diagnosis not present

## 2018-12-29 DIAGNOSIS — J301 Allergic rhinitis due to pollen: Secondary | ICD-10-CM

## 2018-12-29 DIAGNOSIS — N898 Other specified noninflammatory disorders of vagina: Secondary | ICD-10-CM

## 2018-12-29 DIAGNOSIS — E1165 Type 2 diabetes mellitus with hyperglycemia: Secondary | ICD-10-CM | POA: Diagnosis not present

## 2018-12-29 DIAGNOSIS — G4700 Insomnia, unspecified: Secondary | ICD-10-CM

## 2018-12-29 DIAGNOSIS — J309 Allergic rhinitis, unspecified: Secondary | ICD-10-CM | POA: Insufficient documentation

## 2018-12-29 DIAGNOSIS — B009 Herpesviral infection, unspecified: Secondary | ICD-10-CM

## 2018-12-29 LAB — POCT GLYCOSYLATED HEMOGLOBIN (HGB A1C): HbA1c, POC (controlled diabetic range): 12.9 % — AB (ref 0.0–7.0)

## 2018-12-29 LAB — POCT WET PREP (WET MOUNT)
Clue Cells Wet Prep Whiff POC: NEGATIVE
Trichomonas Wet Prep HPF POC: ABSENT

## 2018-12-29 MED ORDER — GLIPIZIDE 10 MG PO TABS: 10.0000 mg | ORAL_TABLET | Freq: Every day | ORAL | 2 refills | Status: DC

## 2018-12-29 MED ORDER — LIDOCAINE 5 % EX OINT: TOPICAL_OINTMENT | CUTANEOUS | 0 refills | Status: DC

## 2018-12-29 MED ORDER — DULAGLUTIDE 1.5 MG/0.5ML ~~LOC~~ SOAJ: 1.5000 mg | SUBCUTANEOUS | 4 refills | Status: DC

## 2018-12-29 MED ORDER — FLUCONAZOLE 150 MG PO TABS: 150.0000 mg | ORAL_TABLET | Freq: Once | ORAL | 0 refills | Status: AC

## 2018-12-29 MED ORDER — INSULIN GLARGINE 100 UNIT/ML SOLOSTAR PEN: 20.0000 [IU] | PEN_INJECTOR | Freq: Every day | SUBCUTANEOUS | 3 refills | Status: DC

## 2018-12-29 MED ORDER — METFORMIN HCL 500 MG PO TABS: 1000.0000 mg | ORAL_TABLET | Freq: Two times a day (BID) | ORAL | 2 refills | Status: DC

## 2018-12-29 MED ORDER — LORATADINE 10 MG PO TABS: 10.0000 mg | ORAL_TABLET | Freq: Every day | ORAL | 2 refills | Status: AC

## 2018-12-29 NOTE — Assessment & Plan Note (Addendum)
A1C worsened. Off trulicity for 2 months. Meds was sent to a wrong pharmacy. Her Pharmacy has been updated to [facility]. I will re-send her meds. Continue, Glipizide, Lantus 20 units and Metformin 1000 mg BID. Continue home CBG monitoring. Hypoglycemic symptoms discussed. F/U in 4-12 weeks or sooner prn.

## 2018-12-29 NOTE — Progress Notes (Addendum)
Subjective:     Patient ID: Lisa Marshall, female   DOB: [birth year], 23 y.o.   MRN: [id number]  Vaginal Discharge  The patient's primary symptoms include genital itching and vaginal discharge. This is a new problem. The current episode started 1 to 4 weeks ago (Started 2 weeks ago). The problem occurs constantly. The problem has been unchanged. The patient is experiencing no pain. Pertinent negatives include no fever. Associated symptoms comments: Burning with urination on the broken skin. Vaginal discharge characteristics: Cheesy. There has been no bleeding. Treatments tried: Home regimen, apple cidar vinegar. The treatment provided mild relief. Sexual activity: Not currently sexually active. No, her partner does not have an STD. Contraceptive use: Depo. Menstrual history: on DEPO.  Sinusitis  This is a recurrent problem. The current episode started 1 to 4 weeks ago. The problem has been waxing and waning since onset. There has been no fever. Her pain is at a severity of 0/10. Associated symptoms include sneezing. Pertinent negatives include no coughing, shortness of breath or sinus pressure. (Runny and stuffy nose) Past treatments include nothing (Need refill of her allergy meds).  DM2:Her glucose was in the 80-90 without symptoms of hypoglycemia few weeks ago. Howoever, lately it has been in the low 200s. She has been out of her Trulicity for 2 months. She stated that it was refilled to a wrong pharmacy and she attempted to contact our office to let us know but was unable to do so. She is currently on Lantus 20 units Qd in addition to Metformin 1000 mg BID and Glipizide 10 mg qd. Denies any other concern. Depression: She went into mood change over the last two weeks after hearing about her aunt's diagnosis of ovarian cancer. She lost interest in many thing. She has since improved and is now getting back on getting regular exercise.  Insomnia: This has been and issue and was worse when she went into  depression. She will like to get some medications for this. She had tried Melatonin with no major improvement. Tachycardia: Denies chest pain, no SOB. She feels nervous coming in today. She endorsed drinking a lot of coffee. Vulva lesion: Need refill of her Valterex  Current Outpatient Medications on File Prior to Visit  Medication Sig Dispense Refill  . albuterol (PROVENTIL HFA;VENTOLIN HFA) 108 (90 Base) MCG/ACT inhaler Inhale 2 puffs into the lungs every 6 (six) hours as needed for wheezing or shortness of breath. (Patient not taking: Reported on 11/03/2018) 18 g 2  . Ascorbic Acid (VITAMIN C PO) Take 1 tablet by mouth daily.    . BD PEN NEEDLE NANO U/F 32G X 4 MM MISC INJECT 10 UNITS DAILY AT 10PM 100 each 3  . Blood Glucose Monitoring Suppl (FREESTYLE FLASH SYSTEM) KIT Use to check blood glucose level three times daily 1 each 0  . Continuous Blood Gluc Receiver (DEXCOM G6 RECEIVER) DEVI 1 Device by Does not apply route continuous. 1 Device 1  . Continuous Blood Gluc Sensor (DEXCOM G6 SENSOR) MISC 1 kit by Does not apply route 3 (three) times daily. Use to check CBG TID 3 each 1  . Continuous Blood Gluc Transmit (DEXCOM G6 TRANSMITTER) MISC USE TO CHECK BLOOD GLUCOSE. REPLACE EVERY 3 MONTHS 3 each 1  . Dulaglutide 1.5 MG/0.5ML SOPN INJECT 1.5 MG INTO THE SKIN ONCE A WEEK. 2 pen 4  . DULoxetine (CYMBALTA) 60 MG capsule Take 1 capsule (60 mg total) by mouth daily. 90 capsule 1  . gabapentin (NEURONTIN) 300 MG  capsule Take 2 capsules (600 mg total) by mouth 3 (three) times daily for 30 days. 180 capsule 0  . glipiZIDE (GLUCOTROL) 10 MG tablet Take 1 tablet (10 mg total) by mouth daily. 90 tablet 1  . ICY HOT LIDOCAINE PLUS MENTHOL 4-1 % PTCH APPLY 1 PATCH QD. LEAVE ON FOR 12 H IN A 24 HOURS PEROID    . Insulin Glargine (LANTUS SOLOSTAR) 100 UNIT/ML Solostar Pen Inject 10 Units into the skin daily. 15 pen 3  . Lido-Capsaicin-Men-Methyl Sal 0.5-0.035-5-20 % PTCH Apply 1 patch topically daily. You  may leave in place for 12-24 hours (Patient not taking: Reported on 11/03/2018) 5 patch 0  . lidocaine (XYLOCAINE) 5 % ointment APPLY TOPICALLY TO THE AFFECTED AREA THREE TIMES DAILY AS NEEDED FOR UP TO 12 DAYS 35.44 g 0  . medroxyPROGESTERone Acetate 150 MG/ML SUSY     . metFORMIN (GLUCOPHAGE) 500 MG tablet TAKE 2 TABLETS BY MOUTH TWICE DAILY WITH FOOD 180 tablet 2  . nystatin (NYSTATIN) powder Apply to breast creases when you have rash twice a day 60 g 0  . Omega-3 Fatty Acids (FISH OIL) 1000 MG CAPS Take 1,000 capsules by mouth 2 (two) times daily.     . valACYclovir (VALTREX) 500 MG tablet Take 1 tablet (500 mg total) by mouth 2 (two) times daily. For three days then take one tab daily to suppress outbreaks. 90 tablet 3   No current facility-administered medications on file prior to visit.    Past Medical History:  Diagnosis Date  . Asthma   . Depression   . Diabetic neuropathy ([place])   . Encounter for surveillance of injectable contraceptive 12/23/2014  . History of PID   . Numbness of foot 07/22/2018  . Paronychia of great toe of left foot 10/03/2017  . Pregnancy, multiple   . Syncope   . Vaginal bleeding 05/30/2017  . Yeast infection 10/02/2018     Review of Systems  Constitutional: Negative.  Negative for fever.  HENT: Positive for sneezing. Negative for sinus pressure.   Eyes: Negative.   Respiratory: Negative.  Negative for cough and shortness of breath.   Cardiovascular: Negative.  Negative for palpitations and leg swelling.  Gastrointestinal: Negative.   Genitourinary: Positive for vaginal discharge.  All other systems reviewed and are negative.      Objective:   Physical Exam Vitals signs and nursing note reviewed. Exam conducted with a chaperone present Letta Median).  Constitutional:      Appearance: She is obese.  HENT:     Head: Normocephalic.     Nose: Rhinorrhea present.  Eyes:     Extraocular Movements: Extraocular movements intact.      Conjunctiva/sclera: Conjunctivae normal.     Pupils: Pupils are equal, round, and reactive to light.  Cardiovascular:     Rate and Rhythm: Normal rate and regular rhythm.     Heart sounds: Normal heart sounds. No murmur. No friction rub. No gallop.   Pulmonary:     Effort: Pulmonary effort is normal. No respiratory distress.     Breath sounds: Normal breath sounds. No wheezing.  Abdominal:     General: Abdomen is flat. Bowel sounds are normal.     Palpations: Abdomen is soft.     Tenderness: There is no abdominal tenderness.  Genitourinary:    Exam position: Lithotomy position.     Vagina: Vaginal discharge present. No erythema or bleeding.     Cervix: Discharge present. No friability.     Comments:  Skin breakdown of her labia minora, mostly on the right posterior region. Thick cheese like discharge present Musculoskeletal:     Right lower leg: No edema.     Left lower leg: No edema.  Neurological:     Mental Status: She is alert and oriented to person, place, and time.  Psychiatric:        Mood and Affect: Mood normal.        Behavior: Behavior normal.        Thought Content: Thought content normal.        Judgment: Judgment normal.      Office Visit from 12/29/2018 in [place]  PHQ-9 Total Score  16         Assessment:     Vaginitis DM2 Depression Insomnia Tachycardia Allergic Rhinitis Vulva lesion    Plan:     Check problem list.  I called and discussed Wet pre result with her. Diflucan filled.  She said she does not have Valtrex in her pharmacy. I called and spoke with the pharmacist. They said patient transferred her Valtrex to a different pharmacy. I called her back and checked if she is able to pick up valtrex from the other pharmacy, she said yes. She will pick up refill.

## 2018-12-29 NOTE — Assessment & Plan Note (Signed)
I will refill her antihistamine

## 2018-12-29 NOTE — Assessment & Plan Note (Signed)
Likely related to her depression. Sleep hygiene instruction given. Continue Cymbalta as instructed. Melatonin as needed. F/U soon if there is no improvement.

## 2018-12-29 NOTE — Assessment & Plan Note (Signed)
It looks yeasty. Wet prep pending. She declined STD check. I will call with Wet prep result. Will escribe meds to her pharmacy.

## 2018-12-29 NOTE — Assessment & Plan Note (Signed)
Chronic and asymptomatic. ?? Anxiety. Recent TSH was normal. On exam, her heart rhythm was normal and didn't sound rapid. Unfortunately, I did not recheck as planned before she left. I called her back and she is willing to come in tomorrow morning for HR check. ED if symptomatic. Consider EKG in the future. She agreed with the plan.

## 2018-12-29 NOTE — Assessment & Plan Note (Signed)
Now back at her baseline. Continue Cymbalta. She will contact me soon if there is any concern.

## 2018-12-29 NOTE — Assessment & Plan Note (Addendum)
Vulva lesion seen today looks like it is from excoriation due to yeast infection. I will however refill her Valtrex. Keep area clean and dry.  Addendum: She has Valtrex refill at her pharmacy for pick up. She will let me know if there is an issue with picking it up.

## 2018-12-29 NOTE — Patient Instructions (Addendum)
It was nice seeing you today. Please continue Lantus 20 units. I will refill Trulicity. Keep your CBG range between 130-180. Please call if your numbers are running to low, less than 100 or too high >200 or you have symptoms of weakness, dizziness.  Insomnia Insomnia is a sleep disorder that makes it difficult to fall asleep or stay asleep. Insomnia can cause fatigue, low energy, difficulty concentrating, mood swings, and poor performance at work or school. There are three different ways to classify insomnia:  Difficulty falling asleep.  Difficulty staying asleep.  Waking up too early in the morning. Any type of insomnia can be long-term (chronic) or short-term (acute). Both are common. Short-term insomnia usually lasts for three months or less. Chronic insomnia occurs at least three times a week for longer than three months. What are the causes? Insomnia may be caused by another condition, situation, or substance, such as:  Anxiety.  Certain medicines.  Gastroesophageal reflux disease (GERD) or other gastrointestinal conditions.  Asthma or other breathing conditions.  Restless legs syndrome, sleep apnea, or other sleep disorders.  Chronic pain.  Menopause.  Stroke.  Abuse of alcohol, tobacco, or illegal drugs.  Mental health conditions, such as depression.  Caffeine.  Neurological disorders, such as Alzheimer's disease.  An overactive thyroid (hyperthyroidism). Sometimes, the cause of insomnia may not be known. What increases the risk? Risk factors for insomnia include:  Gender. Women are affected more often than men.  Age. Insomnia is more common as you get older.  Stress.  Lack of exercise.  Irregular work schedule or working night shifts.  Traveling between different time zones.  Certain medical and mental health conditions. What are the signs or symptoms? If you have insomnia, the main symptom is having trouble falling asleep or having trouble staying  asleep. This may lead to other symptoms, such as:  Feeling fatigued or having low energy.  Feeling nervous about going to sleep.  Not feeling rested in the morning.  Having trouble concentrating.  Feeling irritable, anxious, or depressed. How is this diagnosed? This condition may be diagnosed based on:  Your symptoms and medical history. Your health care provider may ask about: ? Your sleep habits. ? Any medical conditions you have. ? Your mental health.  A physical exam. How is this treated? Treatment for insomnia depends on the cause. Treatment may focus on treating an underlying condition that is causing insomnia. Treatment may also include:  Medicines to help you sleep.  Counseling or therapy.  Lifestyle adjustments to help you sleep better. Follow these instructions at home: Eating and drinking   Limit or avoid alcohol, caffeinated beverages, and cigarettes, especially close to bedtime. These can disrupt your sleep.  Do not eat a large meal or eat spicy foods right before bedtime. This can lead to digestive discomfort that can make it hard for you to sleep. Sleep habits   Keep a sleep diary to help you and your health care provider figure out what could be causing your insomnia. Write down: ? When you sleep. ? When you wake up during the night. ? How well you sleep. ? How rested you feel the next day. ? Any side effects of medicines you are taking. ? What you eat and drink.  Make your bedroom a dark, comfortable place where it is easy to fall asleep. ? Put up shades or blackout curtains to block light from outside. ? Use a white noise machine to block noise. ? Keep the temperature cool.  Limit  screen use before bedtime. This includes: ? Watching TV. ? Using your smartphone, tablet, or computer.  Stick to a routine that includes going to bed and waking up at the same times every day and night. This can help you fall asleep faster. Consider making a quiet  activity, such as reading, part of your nighttime routine.  Try to avoid taking naps during the day so that you sleep better at night.  Get out of bed if you are still awake after 15 minutes of trying to sleep. Keep the lights down, but try reading or doing a quiet activity. When you feel sleepy, go back to bed. General instructions  Take over-the-counter and prescription medicines only as told by your health care provider.  Exercise regularly, as told by your health care provider. Avoid exercise starting several hours before bedtime.  Use relaxation techniques to manage stress. Ask your health care provider to suggest some techniques that may work well for you. These may include: ? Breathing exercises. ? Routines to release muscle tension. ? Visualizing peaceful scenes.  Make sure that you drive carefully. Avoid driving if you feel very sleepy.  Keep all follow-up visits as told by your health care provider. This is important. Contact a health care provider if:  You are tired throughout the day.  You have trouble in your daily routine due to sleepiness.  You continue to have sleep problems, or your sleep problems get worse. Get help right away if:  You have serious thoughts about hurting yourself or someone else. If you ever feel like you may hurt yourself or others, or have thoughts about taking your own life, get help right away. You can go to your nearest emergency department or call:  Your local emergency services (911 in the [place]  A suicide crisis helpline, such as the National Suicide Prevention Lifeline at 773-521-79861-214 212 1246. This is open 24 hours a day. Summary  Insomnia is a sleep disorder that makes it difficult to fall asleep or stay asleep.  Insomnia can be long-term (chronic) or short-term (acute).  Treatment for insomnia depends on the cause. Treatment may focus on treating an underlying condition that is causing insomnia.  Keep a sleep diary to help you and  your health care provider figure out what could be causing your insomnia. This information is not intended to replace advice given to you by your health care provider. Make sure you discuss any questions you have with your health care provider. Document Released: 08/09/2000 Document Revised: 05/22/2017 Document Reviewed: 05/22/2017 Elsevier Interactive Patient Education  2019 [employer]

## 2018-12-30 ENCOUNTER — Ambulatory Visit: Payer: BLUE CROSS/BLUE SHIELD

## 2019-01-14 DIAGNOSIS — E119 Type 2 diabetes mellitus without complications: Secondary | ICD-10-CM | POA: Diagnosis not present

## 2019-01-14 LAB — HM DIABETES EYE EXAM

## 2019-01-27 ENCOUNTER — Other Ambulatory Visit: Payer: Self-pay

## 2019-01-27 ENCOUNTER — Encounter: Payer: Self-pay | Admitting: Family Medicine

## 2019-01-27 ENCOUNTER — Other Ambulatory Visit (HOSPITAL_COMMUNITY)
Admission: RE | Admit: 2019-01-27 | Discharge: 2019-01-27 | Disposition: A | Discharge status: Home / Self Care | Payer: BC Managed Care – PPO | Source: Ambulatory Visit | Attending: Family Medicine | Admitting: Family Medicine

## 2019-01-27 ENCOUNTER — Ambulatory Visit (INDEPENDENT_AMBULATORY_CARE_PROVIDER_SITE_OTHER): Payer: BC Managed Care – PPO | Admitting: Family Medicine

## 2019-01-27 VITALS — BP 112/72 | HR 113

## 2019-01-27 DIAGNOSIS — N898 Other specified noninflammatory disorders of vagina: Secondary | ICD-10-CM | POA: Insufficient documentation

## 2019-01-27 DIAGNOSIS — B3731 Acute candidiasis of vulva and vagina: Secondary | ICD-10-CM

## 2019-01-27 DIAGNOSIS — B373 Candidiasis of vulva and vagina: Secondary | ICD-10-CM

## 2019-01-27 DIAGNOSIS — Z113 Encounter for screening for infections with a predominantly sexual mode of transmission: Secondary | ICD-10-CM | POA: Diagnosis not present

## 2019-01-27 LAB — POCT WET PREP (WET MOUNT)
Clue Cells Wet Prep Whiff POC: NEGATIVE
Trichomonas Wet Prep HPF POC: ABSENT

## 2019-01-27 MED ORDER — INSULIN GLARGINE 100 UNIT/ML SOLOSTAR PEN: 25.0000 [IU] | PEN_INJECTOR | Freq: Every day | SUBCUTANEOUS | 3 refills | Status: DC

## 2019-01-27 MED ORDER — FLUCONAZOLE 150 MG PO TABS: ORAL_TABLET | ORAL | 0 refills | Status: DC

## 2019-01-27 NOTE — Patient Instructions (Addendum)
It was wonderful to see you today.  Thank you for choosing [facility].   Please call [phone] with any questions about today's appointment.  Please be sure to schedule follow up at the front  desk before you leave today.   Terisa Starr, MD  Family Medicine     Schedule to meet with Dr. Raymondo Band about your diabetes---you can make this appointment at the front desk  Increase your insulin dose to 25 units daily   Your fluconazole was sent to your pharmacy  Take 1 dose today then 1 dose in 72 hours  After that, take 1 pill every Saturday (WEEKLY)

## 2019-01-27 NOTE — Progress Notes (Signed)
Patient Name: Lisa Marshall Date of Birth: 01-27-1996 Date of Visit: 01/27/19 PCP: Doreene ElandEniola, Kehinde T, MD  Chief Complaint: yeast infection   Subjective: Lisa HamMontrenia S Siwek is a pleasant 23 y.o. with medical history significant for poorly controlled type 2 diabetes, obesity, recurrent vulvovaginal candidiasis, and HSV presenting today for vaginal discharge.   The patient has had recurrent candidal infections over the past year.  She reports the past 3 weeks she has had thick white discharge which is irritating and very bothersome to her.  She has not had intercourse since February.  She is currently on her menses.  She has had light spotting for 7 days.  She denies any new sexual partners.  She feels otherwise well no pelvic pain or dysuria.  The patient reports her dulaglutide is too expensive-- co pay $300. She is taking Lantus 20 U each morning (forgets 1-2 times per week). AM BG range from 200-288. She is taking maximal dose metformin and glipizide once per day. Denies polyuria or polydipsia.   The patient reports she feels stress---she is embarrassed about her diagnosis of HSV and recurrent infections. She feels overwhelmed with her children at home at times. Has some support in her family. Denies low mood or poor sleep.   ROS: described above- no chest pain or palpitations  ROS  I have reviewed the patient's medical, surgical, family, and social history as appropriate.   Vitals:   01/27/19 1056  BP: 112/72  Pulse: (!) 113  SpO2: 99%   repeat HR 98 There were no vitals filed for this visit. GU Exam:  Chaperoned exam.  External exam: There is a healing abrasion on labia majora on right Vaginal exam notable for thick white discharge, consistent with yeast.  Cervix without discharge or obvious lesion.    Cardiac: Warm well perfused.  Capillary refill less than 3 seconds Respiratory breathing comfortably on room air Psych: Pleasant normal affect, appropriate, normal rate of  speech  Karne was seen today for vaginitis.  Diagnoses and all orders for this visit:  Vaginal discharge exam and wet prep today consistent with vulvovaginal candidiasis.  This is the patient's fourth episode since March 2.  She had episodes on March 2, 17th and fifth.  This meets the IDSA criteria for recurrent vaginal candidal infection.  Will treat as recommended by the IDSA with a dose now dose in 3 days and then a dose weekly for 6 months.  We discussed adverse side effects.  Additionally we discussed that there are adverse fetal harms of this medication that if she were to become pregnant I recommend discontinuing this medication immediately -     POCT Wet Prep [facility]([place] [place]) -     Cervicovaginal ancillary only  Routine screening for STI (sexually transmitted infection) -     HIV antibody (with reflex) -     Hepatitis c antibody (reflex)  Recurrent candidiasis of vagina,  -     fluconazole (DIFLUCAN) 150 MG tablet; Take 1 tablet today, 1 tablet in 3 days, then take 1 tablet WEEKLY thereafter X 6 months  Type 2 diabetes I discussed with the patient again today that her diabetes does increase her risk of recurrent vulvovaginal candidiasis.  We discussed the importance of control of her diabetes for her overall health  as well as for reducing her risk of recurrence of her candidal infection.  She is overall quite emotional about her health status.  She would like to consider meeting with the pharmacist to discuss  how to make her medications more affordable.  Today I increased her insulin to 25 units each morning and I discussed this with her at length.  Her insulin is affordable for her.  She is to check her blood sugar every morning until she meets with Dr. Raymondo Band and I encouraged her to set a timer on her phone to inject her Lantus.  She may benefit from an alternative GLP-1 agonist.  Would avoid a SGLT2 in this patient given her above described history.  -     Insulin Glargine (LANTUS  SOLOSTAR) 100 UNIT/ML Solostar Pen; Inject 25 Units into the skin daily.    Terisa Starr, MD  Family Medicine Teaching Service

## 2019-01-28 ENCOUNTER — Telehealth: Payer: Self-pay | Admitting: Family Medicine

## 2019-01-28 ENCOUNTER — Telehealth: Payer: Self-pay

## 2019-01-28 LAB — HCV COMMENT:

## 2019-01-28 LAB — HIV ANTIBODY (ROUTINE TESTING W REFLEX): HIV Screen 4th Generation wRfx: NONREACTIVE

## 2019-01-28 LAB — HEPATITIS C ANTIBODY (REFLEX): HCV Ab: <0.1 s/co ratio (ref 0.0–0.9)

## 2019-01-28 NOTE — Telephone Encounter (Signed)
Pt informed. Temeka Pore T Arnetra Terris, CMA  

## 2019-01-28 NOTE — Telephone Encounter (Signed)
Pt informed. Ayat Drenning T Vineet Kinney, CMA  

## 2019-01-28 NOTE — Telephone Encounter (Signed)
Left generic voicemail to call back. Nursing- please call patient and let her know the blood test results from yesterday were negative (normal, negative HIV and Hep C).   Lisa Starr, MD  Family Medicine Teaching Service

## 2019-01-29 LAB — CERVICOVAGINAL ANCILLARY ONLY
Chlamydia: NEGATIVE
Neisseria Gonorrhea: NEGATIVE

## 2019-02-01 ENCOUNTER — Telehealth: Payer: Self-pay | Admitting: *Deleted

## 2019-02-01 NOTE — Telephone Encounter (Signed)
Pt informed. Deseree Blount, CMA  

## 2019-02-01 NOTE — Telephone Encounter (Signed)
-----   Message from Martyn Malay, MD sent at 01/29/2019 10:35 AM EDT ----- Normal results- nursing please call patient and let her know GC and chlamydia were negative.

## 2019-02-02 ENCOUNTER — Encounter: Payer: Self-pay | Admitting: Family Medicine

## 2019-02-04 ENCOUNTER — Ambulatory Visit: Payer: BC Managed Care – PPO | Admitting: Pharmacist
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
## 2019-03-28 ENCOUNTER — Emergency Department (HOSPITAL_COMMUNITY)
Admission: EM | Admit: 2019-03-28 | Discharge: 2019-03-28 | Disposition: A | Discharge status: Home / Self Care | Payer: BC Managed Care – PPO | Attending: Emergency Medicine | Admitting: Emergency Medicine

## 2019-03-28 ENCOUNTER — Other Ambulatory Visit: Payer: Self-pay

## 2019-03-28 ENCOUNTER — Encounter (HOSPITAL_COMMUNITY): Payer: Self-pay

## 2019-03-28 DIAGNOSIS — R1010 Upper abdominal pain, unspecified: Secondary | ICD-10-CM | POA: Diagnosis not present

## 2019-03-28 DIAGNOSIS — R197 Diarrhea, unspecified: Secondary | ICD-10-CM

## 2019-03-28 DIAGNOSIS — J45909 Unspecified asthma, uncomplicated: Secondary | ICD-10-CM | POA: Insufficient documentation

## 2019-03-28 DIAGNOSIS — E1140 Type 2 diabetes mellitus with diabetic neuropathy, unspecified: Secondary | ICD-10-CM | POA: Insufficient documentation

## 2019-03-28 DIAGNOSIS — Z794 Long term (current) use of insulin: Secondary | ICD-10-CM | POA: Diagnosis not present

## 2019-03-28 DIAGNOSIS — Z79899 Other long term (current) drug therapy: Secondary | ICD-10-CM | POA: Diagnosis not present

## 2019-03-28 DIAGNOSIS — R112 Nausea with vomiting, unspecified: Secondary | ICD-10-CM | POA: Insufficient documentation

## 2019-03-28 LAB — URINALYSIS, ROUTINE W REFLEX MICROSCOPIC
Bacteria, UA: NONE SEEN
Bilirubin Urine: NEGATIVE
Glucose, UA: >=500 mg/dL — AB
Hgb urine dipstick: NEGATIVE
Ketones, ur: NEGATIVE mg/dL
Leukocytes,Ua: NEGATIVE
Nitrite: NEGATIVE
Protein, ur: NEGATIVE mg/dL
Specific Gravity, Urine: 1.035 — ABNORMAL HIGH (ref 1.005–1.030)
pH: 6 (ref 5.0–8.0)

## 2019-03-28 LAB — COMPREHENSIVE METABOLIC PANEL
ALT: 41 U/L (ref 0–44)
AST: 42 U/L — ABNORMAL HIGH (ref 15–41)
Albumin: 4.2 g/dL (ref 3.5–5.0)
Alkaline Phosphatase: 121 U/L (ref 38–126)
Anion gap: 13 (ref 5–15)
BUN: <5 mg/dL — ABNORMAL LOW (ref 6–20)
CO2: 20 mmol/L — ABNORMAL LOW (ref 22–32)
Calcium: 9.2 mg/dL (ref 8.9–10.3)
Chloride: 100 mmol/L (ref 98–111)
Creatinine, Ser: 0.65 mg/dL (ref 0.44–1.00)
GFR calc Af Amer: >60 mL/min (ref >60)
GFR calc non Af Amer: >60 mL/min (ref >60)
Glucose, Bld: 442 mg/dL — ABNORMAL HIGH (ref 70–99)
Potassium: 3.6 mmol/L (ref 3.5–5.1)
Sodium: 133 mmol/L — ABNORMAL LOW (ref 135–145)
Total Bilirubin: 0.4 mg/dL (ref 0.3–1.2)
Total Protein: 7.7 g/dL (ref 6.5–8.1)

## 2019-03-28 LAB — CBC
HCT: 42.2 % (ref 36.0–46.0)
Hemoglobin: 14.4 g/dL (ref 12.0–15.0)
MCH: 29.6 pg (ref 26.0–34.0)
MCHC: 34.1 g/dL (ref 30.0–36.0)
MCV: 86.7 fL (ref 80.0–100.0)
Platelets: 301 10*3/uL (ref 150–400)
RBC: 4.87 MIL/uL (ref 3.87–5.11)
RDW: 11.9 % (ref 11.5–15.5)
WBC: 3.7 10*3/uL — ABNORMAL LOW (ref 4.0–10.5)
nRBC: 0 % (ref 0.0–0.2)

## 2019-03-28 LAB — LIPASE, BLOOD: Lipase: 26 U/L (ref 11–51)

## 2019-03-28 LAB — I-STAT BETA HCG BLOOD, ED (MC, WL, AP ONLY): I-stat hCG, quantitative: <5 m[IU]/mL (ref <5)

## 2019-03-28 LAB — CBG MONITORING, ED: Glucose-Capillary: 387 mg/dL — ABNORMAL HIGH (ref 70–99)

## 2019-03-28 MED ORDER — SODIUM CHLORIDE 0.9% FLUSH: 3.0000 mL | Freq: Once | INTRAVENOUS | Status: DC

## 2019-03-28 MED ORDER — LOPERAMIDE HCL 2 MG PO CAPS: 2.0000 mg | ORAL_CAPSULE | Freq: Every evening | ORAL | 0 refills | Status: DC | PRN

## 2019-03-28 MED ORDER — ONDANSETRON 8 MG PO TBDP: 8.0000 mg | ORAL_TABLET | Freq: Three times a day (TID) | ORAL | 0 refills | Status: DC | PRN

## 2019-03-28 NOTE — ED Notes (Signed)
Patient had a mushy stool. Sample sent to lab. Discharge instructions reviewed with patient. Patient has no questions at this time.

## 2019-03-28 NOTE — Discharge Instructions (Signed)
Take the medicines prescribed for your nausea, vomiting.  Hydrate well.  Take the diarrhea medicine as needed at nighttime only.  See your own doctor in 1 week. Please return to the ER if your symptoms worsen; you have increased pain, fevers, chills, inability to keep any medications down, confusion. Otherwise see the outpatient doctor as requested.

## 2019-03-28 NOTE — ED Triage Notes (Signed)
Pt states that she was at [facility] on Monday and had raw chicken . Pt states 2 days later, she started having bad abd pain and cramping. Pt states that she was having diarrhea as well.

## 2019-03-28 NOTE — ED Notes (Signed)
Patient ambulatory to restroom to provide stool sample.

## 2019-03-28 NOTE — ED Provider Notes (Signed)
[facility]-EMERGENCY DEPT Provider Note   CSN: [id number] Arrival date & time: 03/28/19  1720     History   Chief Complaint Chief Complaint  Patient presents with  . Abdominal Pain    HPI Lisa Marshall is a 23 y.o. female.     HPI  23-year-old woman comes in with chief complaint of abdominal pain, nausea, vomiting and diarrhea. She reports that she started having abdominal pain on Wednesday after she consumed raw chicken on Monday.  She is having cramping abdominal pain in the upper quadrants that is intermittent followed by vomiting and diarrhea.  Patient has about 5-6 episodes of loose and green stools.  She is having about 3-4 episodes of emesis.  She continues to have nausea throughout the day.  Patient stopped taking her metformin, and the loose bowel movements have continued. No associated fevers, chills, she denies any bloody stools.  Past Medical History:  Diagnosis Date  . Asthma   . Depression   . Diabetic neuropathy (HCC)   . Encounter for surveillance of injectable contraceptive 12/23/2014  . History of PID   . Numbness of foot 07/22/2018  . Paronychia of great toe of left foot 10/03/2017  . Pregnancy, multiple   . Syncope   . Vaginal bleeding 05/30/2017  . Yeast infection 10/02/2018    Patient Active Problem List   Diagnosis Date Noted  . Insomnia 12/29/2018  . Tachycardia 12/29/2018  . Allergic rhinitis 12/29/2018  . Herpes genitalis in women 10/13/2018  . Vaginitis 03/03/2018  . Rectal bleeding 03/03/2018  . Hypertriglyceridemia 05/30/2017  . Poorly controlled type 2 diabetes mellitus with neuropathy (HCC) 05/23/2017  . Amenorrhea 12/08/2015  . Episodic mood disorder (HCC) 06/21/2013  . Morbid obesity (HCC) 11/06/2007  . GERD 11/06/2007  . ASTHMA, INTERMITTENT 10/23/2006    Past Surgical History:  Procedure Laterality Date  . ADENOIDECTOMY    . CESAREAN SECTION N/A 11/18/2013   Procedure: Primary Cesarean Section Delivery  Baby "A" Girl @ 0447, Apgars 1/2/4, Baby "B" @ 0450, Apgars 8/9;  Surgeon: Bernard A Marshall, MD;  Location: [facility]S;  Service: Obstetrics;  Laterality: N/A;  . TONSILLECTOMY       OB History    Gravida  1   Para  1   Term  1   Preterm  0   AB  0   Living  2     SAB  0   TAB  0   Ectopic  0   Multiple  1   Live Births  2            Home Medications    Prior to Admission medications   Medication Sig Start Date End Date Taking? Authorizing Provider  albuterol (PROVENTIL HFA;VENTOLIN HFA) 108 (90 Base) MCG/ACT inhaler Inhale 2 puffs into the lungs every 6 (six) hours as needed for wheezing or shortness of breath. Patient not taking: Reported on 11/03/2018 09/01/18   Eniola, Kehinde T, MD  Ascorbic Acid (VITAMIN C PO) Take 1 tablet by mouth daily.    [provider]  BD PEN NEEDLE NANO U/F 32G X 4 MM MISC INJECT 10 UNITS DAILY AT 10PM 11/17/17   Hensel, William A, MD  Blood Glucose Monitoring Suppl (FREESTYLE FLASH SYSTEM) KIT Use to check blood glucose level three times daily 10/28/18   Eniola, Kehinde T, MD  DULoxetine (CYMBALTA) 60 MG capsule Take 1 capsule (60 mg total) by mouth daily. 11/03/18   Eniola, Kehinde T, MD    fluconazole (DIFLUCAN) 150 MG tablet Take 1 tablet today, 1 tablet in 3 days, then take 1 tablet WEEKLY thereafter X 6 months 01/27/19   Martyn Malay, MD  gabapentin (NEURONTIN) 300 MG capsule Take 2 capsules (600 mg total) by mouth 3 (three) times daily for 30 days. 09/13/18 12/29/18  Wurst, Marye Round, [name]  glipiZIDE (GLUCOTROL) 10 MG tablet Take 1 tablet (10 mg total) by mouth daily. 12/29/18   Eniola, Phill Myron, MD  ICY HOT LIDOCAINE PLUS MENTHOL 4-1 % PTCH APPLY 1 PATCH QD. LEAVE ON FOR 12 H IN A 24 HOURS PEROID 09/13/18   [provider]  Insulin Glargine (LANTUS SOLOSTAR) 100 UNIT/ML Solostar Pen Inject 25 Units into the skin daily. 01/27/19   Martyn Malay, MD  Lido-Capsaicin-Men-Methyl Sal 0.5-0.035-5-20 % PTCH Apply 1 patch topically  daily. You may leave in place for 12-24 hours Patient not taking: Reported on 11/03/2018 09/13/18   Wurst, [place], [name]  lidocaine (XYLOCAINE) 5 % ointment APPLY TOPICALLY TO THE AFFECTED AREA THREE TIMES DAILY AS NEEDED FOR UP TO 12 DAYS 12/29/18   Kinnie Feil, MD  loperamide (IMODIUM) 2 MG capsule Take 1 capsule (2 mg total) by mouth at bedtime as needed for diarrhea or loose stools. 03/28/19   Varney Biles, MD  loratadine (CLARITIN) 10 MG tablet Take 1 tablet (10 mg total) by mouth daily. 12/29/18   Kinnie Feil, MD  medroxyPROGESTERone Acetate 150 MG/ML SUSY  09/16/18   [provider]  metFORMIN (GLUCOPHAGE) 500 MG tablet Take 2 tablets (1,000 mg total) by mouth 2 (two) times daily with a meal. TAKE 2 TABLETS BY MOUTH TWICE DAILY WITH FOOD 12/29/18   Kinnie Feil, MD  nystatin (NYSTATIN) powder Apply to breast creases when you have rash twice a day Patient not taking: Reported on 12/29/2018 11/16/18   Martyn Malay, MD  Omega-3 Fatty Acids (FISH OIL) 1000 MG CAPS Take 1,000 capsules by mouth 2 (two) times daily.     [provider]  ondansetron (ZOFRAN ODT) 8 MG disintegrating tablet Take 1 tablet (8 mg total) by mouth every 8 (eight) hours as needed for nausea. 03/28/19   Varney Biles, MD  valACYclovir (VALTREX) 500 MG tablet Take 1 tablet (500 mg total) by mouth 2 (two) times daily. For three days then take one tab daily to suppress outbreaks. 11/10/18   Martyn Malay, MD    Family History Family History  Problem Relation Age of Onset  . Asthma Other   . Diabetes Other   . Cancer Other   . Sickle cell anemia Mother     Social History Social History   Tobacco Use  . Smoking status: Never Smoker  . Smokeless tobacco: Never Used  Substance Use Topics  . Alcohol use: No  . Drug use: No     Allergies   Penicillins   Review of Systems Review of Systems  Constitutional: Positive for activity change.  Gastrointestinal: Positive for diarrhea,  nausea and vomiting.     Physical Exam Updated Vital Signs BP (!) 133/95 (BP Location: Right Arm)   Pulse 95   Temp 99.5 F (37.5 C) (Oral)   Resp 18   Wt 117.9 kg   SpO2 100%   BMI 46.06 kg/m   Physical Exam Vitals signs and nursing note reviewed.  HENT:     Head: Atraumatic.  Neck:     Musculoskeletal: Normal range of motion and neck supple.  Cardiovascular:     Rate  and Rhythm: Normal rate.  Pulmonary:     Effort: Pulmonary effort is normal.  Abdominal:     General: Bowel sounds are normal.  Skin:    General: Skin is warm and dry.  Neurological:     Mental Status: She is alert and oriented to person, place, and time.      ED Treatments / Results  Labs (all labs ordered are listed, but only abnormal results are displayed) Labs Reviewed  COMPREHENSIVE METABOLIC PANEL - Abnormal; Notable for the following components:      Result Value   Sodium 133 (*)    CO2 20 (*)    Glucose, Bld 442 (*)    BUN <5 (*)    AST 42 (*)    All other components within normal limits  CBC - Abnormal; Notable for the following components:   WBC 3.7 (*)    All other components within normal limits  URINALYSIS, ROUTINE W REFLEX MICROSCOPIC - Abnormal; Notable for the following components:   Color, Urine STRAW (*)    Specific Gravity, Urine 1.035 (*)    Glucose, UA >=500 (*)    All other components within normal limits  CBG MONITORING, ED - Abnormal; Notable for the following components:   Glucose-Capillary 387 (*)    All other components within normal limits  GASTROINTESTINAL PANEL BY PCR, STOOL (REPLACES STOOL CULTURE)  LIPASE, BLOOD  I-STAT BETA HCG BLOOD, ED ([facility], [facility], [facility] ONLY)    EKG None  Radiology No results found.  Procedures Procedures (including critical care time)  Medications Ordered in ED Medications  sodium chloride flush (NS) 0.9 % injection 3 mL (3 mLs Intravenous Not Given 03/28/19 2131)     Initial Impression / Assessment and Plan / ED Course  I have  reviewed the triage vital signs and the nursing notes.  Pertinent labs & imaging results that were available during my care of the patient were reviewed by me and considered in my medical decision making (see chart for details).        23-year-old comes in a chief complaint of nausea, vomiting, abdominal pain and diarrhea.  Symptoms have been going on for about a week now.  She has no associated fevers and there have been no bloody stools.  However patient symptoms are progressing. No recent antibiotics.  Symptoms started 2 days after she had raw chicken. Labs are reassuring.  She is mildly tachycardic but does not appear severely dehydrated.  Stable for discharge.  Final Clinical Impressions(s) / ED Diagnoses   Final diagnoses:  Diarrhea, unspecified type  Nausea vomiting and diarrhea    ED Discharge Orders         Ordered    loperamide (IMODIUM) 2 MG capsule  At bedtime PRN     03/28/19 2137    ondansetron (ZOFRAN ODT) 8 MG disintegrating tablet  Every 8 hours PRN     03/28/19 2137           Nanavati, Ankit, MD 03/28/19 2239  

## 2019-03-29 ENCOUNTER — Telehealth (HOSPITAL_BASED_OUTPATIENT_CLINIC_OR_DEPARTMENT_OTHER): Payer: Self-pay | Admitting: Emergency Medicine

## 2019-03-29 LAB — GASTROINTESTINAL PANEL BY PCR, STOOL (REPLACES STOOL CULTURE)

## 2019-03-30 ENCOUNTER — Telehealth (HOSPITAL_BASED_OUTPATIENT_CLINIC_OR_DEPARTMENT_OTHER): Payer: Self-pay | Admitting: Emergency Medicine

## 2019-04-13 DIAGNOSIS — Z3042 Encounter for surveillance of injectable contraceptive: Secondary | ICD-10-CM | POA: Diagnosis not present

## 2019-05-07 ENCOUNTER — Other Ambulatory Visit: Payer: Self-pay | Admitting: *Deleted

## 2019-05-07 DIAGNOSIS — B373 Candidiasis of vulva and vagina: Secondary | ICD-10-CM

## 2019-05-07 DIAGNOSIS — B3731 Acute candidiasis of vulva and vagina: Secondary | ICD-10-CM

## 2019-05-19 DIAGNOSIS — Z03818 Encounter for observation for suspected exposure to other biological agents ruled out: Secondary | ICD-10-CM | POA: Diagnosis not present

## 2019-05-19 DIAGNOSIS — N915 Oligomenorrhea, unspecified: Secondary | ICD-10-CM | POA: Diagnosis not present

## 2019-05-19 DIAGNOSIS — R0602 Shortness of breath: Secondary | ICD-10-CM | POA: Diagnosis not present

## 2019-05-19 DIAGNOSIS — Z131 Encounter for screening for diabetes mellitus: Secondary | ICD-10-CM | POA: Diagnosis not present

## 2019-05-19 DIAGNOSIS — R635 Abnormal weight gain: Secondary | ICD-10-CM | POA: Diagnosis not present

## 2019-05-19 DIAGNOSIS — E8881 Metabolic syndrome: Secondary | ICD-10-CM | POA: Diagnosis not present

## 2019-05-19 DIAGNOSIS — R5383 Other fatigue: Secondary | ICD-10-CM | POA: Diagnosis not present

## 2019-05-19 DIAGNOSIS — Z79899 Other long term (current) drug therapy: Secondary | ICD-10-CM | POA: Diagnosis not present

## 2019-05-19 DIAGNOSIS — E349 Endocrine disorder, unspecified: Secondary | ICD-10-CM | POA: Diagnosis not present

## 2019-05-19 DIAGNOSIS — E559 Vitamin D deficiency, unspecified: Secondary | ICD-10-CM | POA: Diagnosis not present

## 2019-05-19 DIAGNOSIS — E7800 Pure hypercholesterolemia, unspecified: Secondary | ICD-10-CM | POA: Diagnosis not present

## 2019-06-08 ENCOUNTER — Other Ambulatory Visit: Payer: Self-pay | Admitting: Family Medicine

## 2019-06-23 DIAGNOSIS — L700 Acne vulgaris: Secondary | ICD-10-CM | POA: Diagnosis not present

## 2019-07-06 DIAGNOSIS — Z3042 Encounter for surveillance of injectable contraceptive: Secondary | ICD-10-CM | POA: Diagnosis not present

## 2019-07-06 DIAGNOSIS — Z6841 Body Mass Index (BMI) 40.0 and over, adult: Secondary | ICD-10-CM | POA: Diagnosis not present

## 2019-07-19 ENCOUNTER — Other Ambulatory Visit: Payer: Self-pay | Admitting: *Deleted

## 2019-07-19 DIAGNOSIS — B009 Herpesviral infection, unspecified: Secondary | ICD-10-CM

## 2019-07-19 MED ORDER — VALACYCLOVIR HCL 500 MG PO TABS: 500.0000 mg | ORAL_TABLET | Freq: Two times a day (BID) | ORAL | 1 refills | Status: DC

## 2019-07-21 ENCOUNTER — Telehealth: Payer: Self-pay | Admitting: *Deleted

## 2019-07-21 NOTE — Telephone Encounter (Signed)
Received a PA from pharmacy on valcyclovir for pt.  Info I received will not pull up pt in covermymeds.  LMOVM of pt to call back to verify insurance.  Will await callback. Christen Bame, CMA   Covermymeds info: [id number]  Cabin crew, CMA

## 2019-07-30 ENCOUNTER — Emergency Department (HOSPITAL_COMMUNITY)
Admission: EM | Admit: 2019-07-30 | Discharge: 2019-07-30 | Disposition: A | Discharge status: Home / Self Care | Payer: Managed Care, Other (non HMO)

## 2019-07-30 ENCOUNTER — Other Ambulatory Visit: Payer: Self-pay

## 2019-08-24 ENCOUNTER — Other Ambulatory Visit: Payer: Self-pay | Admitting: *Deleted

## 2019-08-24 DIAGNOSIS — B3731 Acute candidiasis of vulva and vagina: Secondary | ICD-10-CM

## 2019-08-24 DIAGNOSIS — B373 Candidiasis of vulva and vagina: Secondary | ICD-10-CM

## 2019-09-02 ENCOUNTER — Ambulatory Visit (INDEPENDENT_AMBULATORY_CARE_PROVIDER_SITE_OTHER): Payer: Medicaid Other | Admitting: Family Medicine

## 2019-09-02 ENCOUNTER — Other Ambulatory Visit: Payer: Self-pay

## 2019-09-02 VITALS — BP 122/80 | HR 113 | Wt 260.0 lb

## 2019-09-02 DIAGNOSIS — Z3200 Encounter for pregnancy test, result unknown: Secondary | ICD-10-CM | POA: Diagnosis present

## 2019-09-02 DIAGNOSIS — F39 Unspecified mood [affective] disorder: Secondary | ICD-10-CM | POA: Diagnosis not present

## 2019-09-02 DIAGNOSIS — B009 Herpesviral infection, unspecified: Secondary | ICD-10-CM | POA: Diagnosis not present

## 2019-09-02 DIAGNOSIS — B3731 Acute candidiasis of vulva and vagina: Secondary | ICD-10-CM

## 2019-09-02 DIAGNOSIS — B373 Candidiasis of vulva and vagina: Secondary | ICD-10-CM

## 2019-09-02 LAB — POCT URINE PREGNANCY: Preg Test, Ur: NEGATIVE

## 2019-09-02 MED ORDER — VALACYCLOVIR HCL 500 MG PO TABS: 500.0000 mg | ORAL_TABLET | Freq: Two times a day (BID) | ORAL | 1 refills | Status: AC

## 2019-09-02 MED ORDER — LANTUS SOLOSTAR 100 UNIT/ML ~~LOC~~ SOPN: 25.0000 [IU] | PEN_INJECTOR | Freq: Every day | SUBCUTANEOUS | 3 refills | Status: AC

## 2019-09-02 MED ORDER — FLUCONAZOLE 150 MG PO TABS: ORAL_TABLET | ORAL | 0 refills | Status: AC

## 2019-09-02 NOTE — Patient Instructions (Signed)
It was a pleasure seeing you today.   Today we discussed your pregnancy concerns and depression  For your pregnancy concerns: I have referred you to OB/GYN  For your depression: I recommend you see a psychiatrist. I am very proud of you for taking the initiative and seeing [facility]. Please call us to let us know what they say.   Please follow up in 1 month or sooner if symptoms persist or worsen. Please call the clinic immediately if you have any concerns.   Our clinic's number is [phone]. Please call with questions or concerns.   Please go to the emergency room if you have thoughts of hurting yourself or others  Thank you,  Oralia Manis, DO

## 2019-09-02 NOTE — Progress Notes (Signed)
   Subjective:    Patient ID: Lisa Marshall, female    DOB: [birth year], 24 y.o.   MRN: [id number]   CC: concern for possible pregnancy   HPI: Concern for possible pregnancy Patient presenting today for concern of pregnancy.  Was told in November by her OB/GYN that she was pregnant.  We are unable to see these records.  Was dismissed from her OB/GYN practice as they no longer take Medicaid.  She does want to see an OB/GYN.  Elevated PHQ2 Although visit was not for depression patient had elevated PHQ 2 on arrival.  Discussed this further and she states she is feeling more depressed.  Her son was recently diagnosed with cancer and she was missing a lot of work for his treatments.  Was fired from her job for this.  States her daughter also has a special needs.  Her other child needs to go to [facility] at [facility] for several treatments.  Patient is also homeschooling all her children this year which is difficult for her.  Does have a good support system in her mother who was recently sent home from prison.  Denies any SI/HI.  Is currently taking duloxetine but feels this is not helping much.  States that it makes her sleepy but does not help with her depression.  Objective:  BP 122/80   Pulse (!) 113   Wt 260 lb (117.9 kg)   SpO2 98%   BMI 46.06 kg/m  Vitals and nursing note reviewed  General: well nourished, in no acute distress HEENT: normocephalic  Neck: supple  Respiratory: no increased work of breathing Extremities: no edema or cyanosis. Warm, well perfused.  Skin: warm and dry, no rashes noted Neuro: alert and oriented, no focal deficits   Assessment & Plan:    Possible pregnancy Pregnancy test negative in office.  Unclear if patient had miscarriage versus was not pregnant at the time.  We are unable to see records from her OB/GYN.  Patient did express a desire to see an OB/GYN.  I have placed referral.  Strict return precautions given.  Episodic mood disorder  [facility]) Patient with documented mood disorder in the past.  Has been on duloxetine.  Per chart review does have possible bipolar disorder.  Patient does admit to having bipolar disorder.  Was told by her PCP to see psychiatry.  I discussed this with patient.  She states [facility] did not have any available appointments at that time.  Patient was able to call [facility] psychiat[facility] in the office and was able to schedule a follow-up visit this afternoon.  He stated if she go straight from the clinic they will be able to see her right away.  Denies SI or HI at this time.  States that she loves her children would never do anything to hurt herself for them.  States that she will go straight to [place] to discuss new treatment plan as she has a combination of depression with bipolar disorder.  Follow-up with PCP.    Return in about 2 weeks (around 09/16/2019) for Follow-up with PCP.   Oralia Manis, DO, PGY-3

## 2019-09-03 DIAGNOSIS — Z3200 Encounter for pregnancy test, result unknown: Secondary | ICD-10-CM | POA: Insufficient documentation

## 2019-09-03 NOTE — Assessment & Plan Note (Signed)
Patient with documented mood disorder in the past.  Has been on duloxetine.  Per chart review does have possible bipolar disorder.  Patient does admit to having bipolar disorder.  Was told by her PCP to see psychiatry.  I discussed this with patient.  She states [facility] did not have any available appointments at that time.  Patient was able to call [facility] in the office and was able to schedule a follow-up visit this afternoon.  He stated if she go straight from the clinic they will be able to see her right away.  Denies SI or HI at this time.  States that she loves her children would never do anything to hurt herself for them.  States that she will go straight to [place] to discuss new treatment plan as she has a combination of depression with bipolar disorder.  Follow-up with PCP.

## 2019-09-03 NOTE — Assessment & Plan Note (Signed)
Pregnancy test negative in office.  Unclear if patient had miscarriage versus was not pregnant at the time.  We are unable to see records from her OB/GYN.  Patient did express a desire to see an OB/GYN.  I have placed referral.  Strict return precautions given.

## 2019-09-13 ENCOUNTER — Telehealth: Payer: Self-pay | Admitting: *Deleted

## 2019-09-13 NOTE — Telephone Encounter (Signed)
PA needed for Dexcom G6 transmitter (Continuous Blood glucose meter)  Form placed in PCP's box for review and signature.  At this time, this form MUST BE faxed back.  Please return to RN team.  Jone Baseman, CMA

## 2019-09-14 NOTE — Telephone Encounter (Signed)
Form completed and was returned to the RN's clinic.  Please help this patient schedule DM appointment with me. She had not been seen for this in over 6 months. No recent A1C check.

## 2019-09-15 ENCOUNTER — Emergency Department (HOSPITAL_COMMUNITY): Payer: Medicaid Other

## 2019-09-15 ENCOUNTER — Encounter (HOSPITAL_COMMUNITY): Payer: Self-pay

## 2019-09-15 ENCOUNTER — Other Ambulatory Visit: Payer: Self-pay

## 2019-09-15 ENCOUNTER — Emergency Department (HOSPITAL_COMMUNITY)
Admission: EM | Admit: 2019-09-15 | Discharge: 2019-09-15 | Disposition: A | Discharge status: Home / Self Care | Payer: Medicaid Other | Attending: Emergency Medicine | Admitting: Emergency Medicine

## 2019-09-15 DIAGNOSIS — Z79899 Other long term (current) drug therapy: Secondary | ICD-10-CM | POA: Insufficient documentation

## 2019-09-15 DIAGNOSIS — Z7984 Long term (current) use of oral hypoglycemic drugs: Secondary | ICD-10-CM | POA: Insufficient documentation

## 2019-09-15 DIAGNOSIS — E119 Type 2 diabetes mellitus without complications: Secondary | ICD-10-CM | POA: Diagnosis not present

## 2019-09-15 DIAGNOSIS — R0602 Shortness of breath: Secondary | ICD-10-CM | POA: Diagnosis not present

## 2019-09-15 DIAGNOSIS — U071 COVID-19: Secondary | ICD-10-CM

## 2019-09-15 DIAGNOSIS — J45909 Unspecified asthma, uncomplicated: Secondary | ICD-10-CM | POA: Diagnosis not present

## 2019-09-15 DIAGNOSIS — R0981 Nasal congestion: Secondary | ICD-10-CM | POA: Diagnosis present

## 2019-09-15 LAB — COMPREHENSIVE METABOLIC PANEL
ALT: 19 U/L (ref 0–44)
AST: 22 U/L (ref 15–41)
Albumin: 3.2 g/dL — ABNORMAL LOW (ref 3.5–5.0)
Alkaline Phosphatase: 107 U/L (ref 38–126)
Anion gap: 13 (ref 5–15)
BUN: 5 mg/dL — ABNORMAL LOW (ref 6–20)
CO2: 19 mmol/L — ABNORMAL LOW (ref 22–32)
Calcium: 8.8 mg/dL — ABNORMAL LOW (ref 8.9–10.3)
Chloride: 101 mmol/L (ref 98–111)
Creatinine, Ser: 0.52 mg/dL (ref 0.44–1.00)
GFR calc Af Amer: >60 mL/min (ref >60)
GFR calc non Af Amer: >60 mL/min (ref >60)
Glucose, Bld: 288 mg/dL — ABNORMAL HIGH (ref 70–99)
Potassium: 4.3 mmol/L (ref 3.5–5.1)
Sodium: 133 mmol/L — ABNORMAL LOW (ref 135–145)
Total Bilirubin: 0.3 mg/dL (ref 0.3–1.2)
Total Protein: 7.9 g/dL (ref 6.5–8.1)

## 2019-09-15 LAB — BRAIN NATRIURETIC PEPTIDE: B Natriuretic Peptide: 22.6 pg/mL (ref 0.0–100.0)

## 2019-09-15 LAB — URINALYSIS, ROUTINE W REFLEX MICROSCOPIC
Bilirubin Urine: NEGATIVE
Glucose, UA: >=500 mg/dL — AB
Hgb urine dipstick: NEGATIVE
Ketones, ur: 80 mg/dL — AB
Leukocytes,Ua: NEGATIVE
Nitrite: NEGATIVE
Protein, ur: 100 mg/dL — AB
Specific Gravity, Urine: 1.043 — ABNORMAL HIGH (ref 1.005–1.030)
pH: 6 (ref 5.0–8.0)

## 2019-09-15 LAB — RESPIRATORY PANEL BY RT PCR (FLU A&B, COVID)
Influenza A by PCR: NEGATIVE
Influenza B by PCR: NEGATIVE
SARS Coronavirus 2 by RT PCR: POSITIVE — AB

## 2019-09-15 LAB — CBC WITH DIFFERENTIAL/PLATELET
Abs Immature Granulocytes: 0.01 10*3/uL (ref 0.00–0.07)
Basophils Absolute: 0 10*3/uL (ref 0.0–0.1)
Basophils Relative: 0 %
Eosinophils Absolute: 0 10*3/uL (ref 0.0–0.5)
Eosinophils Relative: 0 %
HCT: 42.9 % (ref 36.0–46.0)
Hemoglobin: 14.2 g/dL (ref 12.0–15.0)
Immature Granulocytes: 0 %
Lymphocytes Relative: 29 %
Lymphs Abs: 1.1 10*3/uL (ref 0.7–4.0)
MCH: 29 pg (ref 26.0–34.0)
MCHC: 33.1 g/dL (ref 30.0–36.0)
MCV: 87.7 fL (ref 80.0–100.0)
Monocytes Absolute: 0.2 10*3/uL (ref 0.1–1.0)
Monocytes Relative: 6 %
Neutro Abs: 2.5 10*3/uL (ref 1.7–7.7)
Neutrophils Relative %: 65 %
Platelets: 243 10*3/uL (ref 150–400)
RBC: 4.89 MIL/uL (ref 3.87–5.11)
RDW: 11.7 % (ref 11.5–15.5)
WBC: 3.9 10*3/uL — ABNORMAL LOW (ref 4.0–10.5)
nRBC: 0 % (ref 0.0–0.2)

## 2019-09-15 LAB — CBG MONITORING, ED: Glucose-Capillary: 283 mg/dL — ABNORMAL HIGH (ref 70–99)

## 2019-09-15 LAB — POC SARS CORONAVIRUS 2 AG -  ED: SARS Coronavirus 2 Ag: NEGATIVE

## 2019-09-15 LAB — I-STAT BETA HCG BLOOD, ED (MC, WL, AP ONLY): I-stat hCG, quantitative: <5 m[IU]/mL (ref <5)

## 2019-09-15 LAB — LACTIC ACID, PLASMA: Lactic Acid, Venous: 1.5 mmol/L (ref 0.5–1.9)

## 2019-09-15 MED ORDER — SODIUM CHLORIDE 0.9 % IV BOLUS
1000.0000 mL | Freq: Once | INTRAVENOUS | Status: AC
  Administered 2019-09-15: 1000 mL via INTRAVENOUS

## 2019-09-15 MED ORDER — ACETAMINOPHEN 325 MG PO TABS
650.0000 mg | ORAL_TABLET | Freq: Once | ORAL | Status: AC
  Administered 2019-09-15: 650 mg via ORAL
  Filled 2019-09-15: qty 2

## 2019-09-15 MED ORDER — IBUPROFEN 400 MG PO TABS
600.0000 mg | ORAL_TABLET | Freq: Once | ORAL | Status: AC
  Administered 2019-09-15: 19:00:00 600 mg via ORAL
  Filled 2019-09-15: qty 1

## 2019-09-15 MED ORDER — BENZONATATE 100 MG PO CAPS: 100.0000 mg | ORAL_CAPSULE | Freq: Three times a day (TID) | ORAL | 0 refills | Status: AC

## 2019-09-15 MED ORDER — FLUTICASONE PROPIONATE 50 MCG/ACT NA SUSP: 2.0000 | Freq: Every day | NASAL | 0 refills | Status: DC

## 2019-09-15 NOTE — ED Triage Notes (Signed)
Pt reports 5 days of nasal/chest congestion, dizziness, fatigue and dehydration. Pt a.o, resp e.u at this time. States she tested negative for COVID recently.

## 2019-09-15 NOTE — ED Provider Notes (Signed)
[facility] EMERGENCY DEPARTMENT Provider Note   CSN: [id number] Arrival date & time: 09/15/19  1457     History Chief Complaint  Patient presents with  . Nasal Congestion  . Weakness    CAI FLOTT is a 24 y.o. female.  HPI   Pt is a 24 y/o female with history of asthma, depression, diabetes, diabetic neuropathy, who presents the emergency department today for evaluation of respiratory symptoms.  Patient states that she has had rhinorrhea, nasal congestion, postnasal drip that started about 3 days ago.  She has also developed a cough and has intermittent pain to the right upper chest.  This is mostly provoked by coughing and deep breathing.  She has not noted any fevers at home but has felt very fatigued.  She also states that she lost her smell a few days ago but this is since resolved. Denies abd pain, NVD, constipation, dysuria, frequency, or urgency. BS have been running in the 200s.   She denies any shortness of breath, bilateral lower extremity swelling or calf pain, hemoptysis, recent surgeries or extended periods of travel, estrogen use, or history of VTE or cancer.  Past Medical History:  Diagnosis Date  . Asthma   . Depression   . Diabetic neuropathy ([place])   . Encounter for surveillance of injectable contraceptive 12/23/2014  . History of PID   . Numbness of foot 07/22/2018  . Paronychia of great toe of left foot 10/03/2017  . Pregnancy, multiple   . Syncope   . Vaginal bleeding 05/30/2017  . Yeast infection 10/02/2018    Patient Active Problem List   Diagnosis Date Noted  . Possible pregnancy 09/03/2019  . Insomnia 12/29/2018  . Tachycardia 12/29/2018  . Allergic rhinitis 12/29/2018  . Herpes genitalis in women 10/13/2018  . Vaginitis 03/03/2018  . Rectal bleeding 03/03/2018  . Hypertriglyceridemia 05/30/2017  . Poorly controlled type 2 diabetes mellitus with neuropathy ([place]) 05/23/2017  . Amenorrhea 12/08/2015  . Episodic mood disorder  ([place]) 06/21/2013  . Morbid obesity ([place]) 11/06/2007  . GERD 11/06/2007  . ASTHMA, INTERMITTENT 10/23/2006    Past Surgical History:  Procedure Laterality Date  . ADENOIDECTOMY    . CESAREAN SECTION N/A 11/18/2013   Procedure: Primary Cesarean Section Delivery Baby "A" Girl @ 0447, Apgars 1/2/4, Baby "B" @ 0450, Apgars 8/9;  Surgeon: Frederico Hamman, MD;  Location: [facility];  Service: Obstetrics;  Laterality: N/A;  . TONSILLECTOMY       OB History    Gravida  1   Para  1   Term  1   Preterm  0   AB  0   Living  2     SAB  0   TAB  0   Ectopic  0   Multiple  1   Live Births  2           Family History  Problem Relation Age of Onset  . Asthma Other   . Diabetes Other   . Cancer Other   . Sickle cell anemia Mother     Social History   Tobacco Use  . Smoking status: Never Smoker  . Smokeless tobacco: Never Used  Substance Use Topics  . Alcohol use: No  . Drug use: No    Home Medications Prior to Admission medications   Medication Sig Start Date End Date Taking? Authorizing Provider  albuterol (PROVENTIL HFA;VENTOLIN HFA) 108 (90 Base) MCG/ACT inhaler Inhale 2 puffs into the lungs every 6 (  six) hours as needed for wheezing or shortness of breath. Patient not taking: Reported on 11/03/2018 09/01/18   Kinnie Feil, MD  Ascorbic Acid (VITAMIN C PO) Take 1 tablet by mouth daily.    [provider]  BD PEN NEEDLE NANO U/F 32G X 4 MM MISC INJECT 10 UNITS DAILY AT 10PM 11/17/17   Zenia Resides, MD  benzonatate (TESSALON) 100 MG capsule Take 1 capsule (100 mg total) by mouth every 8 (eight) hours for 5 days. 09/15/19 09/20/19  Shaheer Bonfield S, [name]  Blood Glucose Monitoring Suppl (FREESTYLE FLASH SYSTEM) KIT Use to check blood glucose level three times daily 10/28/18   Andrena Mews T, MD  DULoxetine (CYMBALTA) 60 MG capsule Take 1 capsule (60 mg total) by mouth daily. 11/03/18   Kinnie Feil, MD  fluconazole (DIFLUCAN) 150 MG tablet Take 1  tablet today, 1 tablet in 3 days, then take 1 tablet WEEKLY thereafter X 6 months 09/02/19   Caroline More, DO  fluticasone (FLONASE) 50 MCG/ACT nasal spray Place 2 sprays into both nostrils daily. 09/15/19   Karri Kallenbach S, [name]  gabapentin (NEURONTIN) 300 MG capsule Take 2 capsules (600 mg total) by mouth 3 (three) times daily for 30 days. 09/13/18 12/29/18  Wurst, Marye Round, [name]  glipiZIDE (GLUCOTROL) 10 MG tablet Take 1 tablet (10 mg total) by mouth daily. 12/29/18   Eniola, Phill Myron, MD  ICY HOT LIDOCAINE PLUS MENTHOL 4-1 % PTCH APPLY 1 PATCH QD. LEAVE ON FOR 12 H IN A 24 HOURS PEROID 09/13/18   [provider]  Insulin Glargine (LANTUS SOLOSTAR) 100 UNIT/ML Solostar Pen Inject 25 Units into the skin daily. 09/02/19   Tammi Klippel, Sherin, DO  Lido-Capsaicin-Men-Methyl Sal 0.5-0.035-5-20 % PTCH Apply 1 patch topically daily. You may leave in place for 12-24 hours Patient not taking: Reported on 11/03/2018 09/13/18   Wurst, [place], [name]  lidocaine (XYLOCAINE) 5 % ointment APPLY EXTERNALLY TO THE AFFECTED AREA THREE TIMES DAILY FOR UP TO 12 DAYS AS NEEDED 06/08/19   Kinnie Feil, MD  loperamide (IMODIUM) 2 MG capsule Take 1 capsule (2 mg total) by mouth at bedtime as needed for diarrhea or loose stools. 03/28/19   Varney Biles, MD  loratadine (CLARITIN) 10 MG tablet Take 1 tablet (10 mg total) by mouth daily. 12/29/18   Kinnie Feil, MD  medroxyPROGESTERone Acetate 150 MG/ML SUSY  09/16/18   [provider]  metFORMIN (GLUCOPHAGE) 500 MG tablet Take 2 tablets (1,000 mg total) by mouth 2 (two) times daily with a meal. TAKE 2 TABLETS BY MOUTH TWICE DAILY WITH FOOD 12/29/18   Kinnie Feil, MD  nystatin (NYSTATIN) powder Apply to breast creases when you have rash twice a day Patient not taking: Reported on 12/29/2018 11/16/18   Martyn Malay, MD  Omega-3 Fatty Acids (FISH OIL) 1000 MG CAPS Take 1,000 capsules by mouth 2 (two) times daily.     [provider]  ondansetron  (ZOFRAN ODT) 8 MG disintegrating tablet Take 1 tablet (8 mg total) by mouth every 8 (eight) hours as needed for nausea. 03/28/19   Varney Biles, MD  valACYclovir (VALTREX) 500 MG tablet Take 1 tablet (500 mg total) by mouth 2 (two) times daily. For three days then take one tab daily to suppress outbreaks. 09/02/19   Caroline More, DO    Allergies    Penicillins  Review of Systems   Review of Systems  Constitutional: Positive for fatigue. Negative for chills and fever.  HENT: Positive for congestion, rhinorrhea and sore throat. Negative for ear pain.   Eyes: Negative for visual disturbance.  Respiratory: Positive for cough. Negative for shortness of breath.   Cardiovascular: Positive for chest pain. Negative for palpitations and leg swelling.  Gastrointestinal: Negative for abdominal pain, constipation, diarrhea, nausea and vomiting.  Genitourinary: Negative for dysuria and hematuria.  Musculoskeletal: Negative for back pain.  Skin: Negative for color change and rash.  Neurological: Positive for headaches. Negative for syncope.  All other systems reviewed and are negative.   Physical Exam Updated Vital Signs BP 117/89   Pulse (!) 103   Temp 98.5 F (36.9 C) (Axillary)   Resp (!) 21   SpO2 99%   Physical Exam Vitals and nursing note reviewed.  Constitutional:      General: She is not in acute distress.    Appearance: She is well-developed. She is diaphoretic.  HENT:     Head: Normocephalic and atraumatic.     Nose:     Comments: ttp to the bilat maxillary sinuses Eyes:     Extraocular Movements: Extraocular movements intact.     Conjunctiva/sclera: Conjunctivae normal.     Pupils: Pupils are equal, round, and reactive to light.  Cardiovascular:     Rate and Rhythm: Regular rhythm. Tachycardia present.     Heart sounds: No murmur.  Pulmonary:     Effort: Pulmonary effort is normal. No respiratory distress.     Breath sounds: Normal breath sounds. No wheezing, rhonchi or  rales.  Abdominal:     General: Bowel sounds are normal. There is no distension.     Palpations: Abdomen is soft.     Tenderness: There is no abdominal tenderness. There is no guarding or rebound.  Musculoskeletal:     Cervical back: Normal range of motion and neck supple. No rigidity.  Skin:    General: Skin is warm.  Neurological:     Mental Status: She is alert.     ED Results / Procedures / Treatments   Labs (all labs ordered are listed, but only abnormal results are displayed) Labs Reviewed  RESPIRATORY PANEL BY RT PCR (FLU A&B, COVID) - Abnormal; Notable for the following components:      Result Value   SARS Coronavirus 2 by RT PCR POSITIVE (*)    All other components within normal limits  COMPREHENSIVE METABOLIC PANEL - Abnormal; Notable for the following components:   Sodium 133 (*)    CO2 19 (*)    Glucose, Bld 288 (*)    BUN 5 (*)    Calcium 8.8 (*)    Albumin 3.2 (*)    All other components within normal limits  CBC WITH DIFFERENTIAL/PLATELET - Abnormal; Notable for the following components:   WBC 3.9 (*)    All other components within normal limits  URINALYSIS, ROUTINE W REFLEX MICROSCOPIC - Abnormal; Notable for the following components:   APPearance HAZY (*)    Specific Gravity, Urine 1.043 (*)    Glucose, UA >=500 (*)    Ketones, ur 80 (*)    Protein, ur 100 (*)    Bacteria, UA RARE (*)    All other components within normal limits  CBG MONITORING, ED - Abnormal; Notable for the following components:   Glucose-Capillary 283 (*)    All other components within normal limits  LACTIC ACID, PLASMA  BRAIN NATRIURETIC PEPTIDE  POC SARS CORONAVIRUS 2 AG -  ED  I-STAT BETA HCG BLOOD, ED ([facility], [facility], [facility] ONLY)  EKG EKG Interpretation  Date/Time:  Wednesday September 15 2019 15:31:04 EST Ventricular Rate:  124 PR Interval:  158 QRS Duration: 64 QT Interval:  290 QTC Calculation: 416 R Axis:   -7 Text Interpretation: Sinus tachycardia Possible Anterior infarct  , age undetermined Abnormal ECG Confirmed by Virgel Manifold [phone]) on 09/15/2019 5:57:16 PM   Radiology DG Chest Port 1 View  Result Date: 09/15/2019 CLINICAL DATA:  Shortness of breath. EXAM: PORTABLE CHEST 1 VIEW COMPARISON:  October 23, 2016 FINDINGS: Mildly decreased lung volumes are seen which is likely secondary to suboptimal patient inspiration. Subsequent vascular crowding is noted. Very mild bilateral suprahilar atelectasis and/or infiltrate is seen. There is no evidence of a pleural effusion or pneumothorax. The heart size and mediastinal contours are within normal limits. The visualized skeletal structures are unremarkable. IMPRESSION: 1. Very mild bilateral suprahilar atelectasis and/or infiltrate. This may be, in part, secondary to the previously noted low lung volumes. Correlation with follow-up chest plain film with improved inspiratory effort is recommended. Electronically Signed   By: Virgina Norfolk M.D.   On: 09/15/2019 16:38    Procedures Procedures (including critical care time)  Medications Ordered in ED Medications  acetaminophen (TYLENOL) tablet 650 mg (650 mg Oral Given 09/15/19 1548)  sodium chloride 0.9 % bolus 1,000 mL (0 mLs Intravenous Stopped 09/15/19 1826)  ibuprofen (ADVIL) tablet 600 mg (600 mg Oral Given 09/15/19 1831)    ED Course  I have reviewed the triage vital signs and the nursing notes.  Pertinent labs & imaging results that were available during my care of the patient were reviewed by me and considered in my medical decision making (see chart for details).    MDM Rules/Calculators/A&P                     Patient is a 24 year old female presenting for evaluation of URI symptoms for the last several days.  She also has not been eating and drinking due to not feeling well.  On arrival she is febrile, tachycardic and hypotensive in triage however on my evaluation her blood pressure is improved in the 120s.  Lungs are clear to auscultation.  Abdomen  soft and nontender.  She does appear dry.  Reviewed labs ordered in triage CBC with mild leukopenia, no anemia CMP with slightly low sodium, low bicarb of 19 but normal anion gap.  Blood glucose elevated to 88.  Kidney function liver function are normal Beta-hCG is negative BNP negative Lactic acid negative Point-of-care Covid is negative 2-hour Covid test is positive  UA with glucosuria, ketonuria (likely from dehydration), proteinuria and rare bacteria. Not suggest of UTI.   EKG with sinus tachycardia, no stemi  CXR  With very mild bilateral suprahilar atelectasis and/or infiltrate. This may be, in part, secondary to the previously noted low lung volumes.  Pt given IVF, tylenol. On reassessment she states her body aches are gone and she is feeling somewhat improved after the fluids. Her HR has improved as well and is now in the low 100s. She is satting well on RA and denies and SOB. She does not require admission to the hospital at this time. Discussed findings and plan for d/c with close pcp f/u. Advised on quarantine measures. Will give symptomatic medications for her sxs. Advised on return precautions. She voiced understanding of the plan and reasons to return. All questions answered, pt stable for d/c.   ---  Merrilee Jansky was evaluated in Emergency Department on 09/15/2019 for  the symptoms described in the history of present illness. She was evaluated in the context of the global COVID-19 pandemic, which necessitated consideration that the patient might be at risk for infection with the SARS-CoV-2 virus that causes COVID-19. Institutional protocols and algorithms that pertain to the evaluation of patients at risk for COVID-19 are in a state of rapid change based on information released by regulatory bodies including the CDC and federal and state organizations. These policies and algorithms were followed during the patient's care in the ED.   Final Clinical Impression(s) / ED  Diagnoses Final diagnoses:  [id number]    Rx / DC Orders ED Discharge Orders         Ordered    benzonatate (TESSALON) 100 MG capsule  Every 8 hours     09/15/19 1925    fluticasone (FLONASE) 50 MCG/ACT nasal spray  Daily     09/15/19 1925           Bishop Dublin 09/15/19 Rise Paganini, MD 09/16/19 1505

## 2019-09-15 NOTE — ED Notes (Signed)
Pt eating at this time, will assess oral temp after pt has eaten.

## 2019-09-15 NOTE — ED Notes (Signed)
Patient verbalizes understanding of discharge instructions. Opportunity for questioning and answers were provided. Armband removed by staff, pt discharged from ED to home via POV  

## 2019-09-15 NOTE — Discharge Instructions (Addendum)
You were given Fluticasone nasal spray for your nasal congestion. You were also given cough medication for your cough. Take as directed. Rotate tylenol and motrin to treat your fevers and body aches. Stay well hydrated.   You should be isolated for at least 7 days since the onset of your symptoms AND >72 hours after symptoms resolution (absence of fever without the use of fever reducing medication and improvement in respiratory symptoms), whichever is longer  Please follow up with your primary care provider within 3-5 days for re-evaluation of your symptoms. If you do not have a primary care provider, information for a healthcare clinic has been provided for you to make arrangements for follow up care. Please return to the emergency department for any new or worsening symptoms.

## 2019-09-16 ENCOUNTER — Telehealth: Payer: Self-pay | Admitting: Nurse Practitioner

## 2019-09-16 NOTE — Telephone Encounter (Signed)
Called to Discuss with patient about Covid symptoms and the use of bamlanivimab, a monoclonal antibody infusion for those with mild to moderate Covid symptoms and at a high risk of hospitalization.     Pt is qualified for this infusion at the [facility] due to co-morbid conditions and/or a member of an at-risk group.     Unable to reach pt  

## 2019-09-17 NOTE — Telephone Encounter (Signed)
Faxed to [phone].  Original in purple folder in triage office.  If approved will scan into chart. Jone Baseman, CMA

## 2019-09-22 NOTE — Telephone Encounter (Signed)
Denied.  PCP should be receiving a explanation in box. Jone Baseman, CMA

## 2019-10-04 ENCOUNTER — Other Ambulatory Visit: Payer: Self-pay | Admitting: *Deleted

## 2019-10-04 ENCOUNTER — Ambulatory Visit: Payer: Medicaid Other | Attending: Internal Medicine

## 2019-10-04 ENCOUNTER — Encounter: Payer: Medicaid Other | Admitting: Obstetrics & Gynecology

## 2019-10-04 DIAGNOSIS — Z20822 Contact with and (suspected) exposure to covid-19: Secondary | ICD-10-CM

## 2019-10-04 MED ORDER — LIDOCAINE 5 % EX OINT: TOPICAL_OINTMENT | CUTANEOUS | 0 refills | Status: DC

## 2019-10-05 ENCOUNTER — Telehealth (INDEPENDENT_AMBULATORY_CARE_PROVIDER_SITE_OTHER): Payer: Medicaid Other | Admitting: Family Medicine

## 2019-10-05 ENCOUNTER — Other Ambulatory Visit: Payer: Self-pay

## 2019-10-05 DIAGNOSIS — Z5320 Procedure and treatment not carried out because of patient's decision for unspecified reasons: Secondary | ICD-10-CM

## 2019-10-05 DIAGNOSIS — Z09 Encounter for follow-up examination after completed treatment for conditions other than malignant neoplasm: Secondary | ICD-10-CM

## 2019-10-05 LAB — NOVEL CORONAVIRUS, NAA: SARS-CoV-2, NAA: NOT DETECTED

## 2019-10-05 NOTE — Progress Notes (Signed)
Patient ID: Lisa Marshall, female   DOB: Jul 09, 1996, 24 y.o.   MRN: [id number] First call attempt with no response at 8:58AM

## 2019-10-05 NOTE — Progress Notes (Signed)
[facility] Telemedicine Visit  No response

## 2019-10-05 NOTE — Progress Notes (Signed)
Patient ID: Lisa Marshall, female   DOB: November 30, 1995, 24 y.o.   MRN: [id number] Second call attempt was unsuccessful

## 2019-11-02 ENCOUNTER — Other Ambulatory Visit: Payer: Self-pay | Admitting: *Deleted

## 2019-11-02 MED ORDER — LIDOCAINE 5 % EX OINT: TOPICAL_OINTMENT | CUTANEOUS | 0 refills | Status: AC

## 2019-11-12 ENCOUNTER — Ambulatory Visit: Payer: Medicaid Other

## 2019-11-12 ENCOUNTER — Other Ambulatory Visit: Payer: Self-pay

## 2019-11-12 DIAGNOSIS — Z3009 Encounter for other general counseling and advice on contraception: Secondary | ICD-10-CM

## 2019-11-12 NOTE — Progress Notes (Signed)
Patient presents in nurse clinic for depo provera injection. Per chart review, I do not see where this is an active medication, nor has been given at our office recently. Patient stated, "it has been a while since my last shot." Advised patient we can schedule her with another provider today or soon to start depo, however I will not be able to initiate today. Patient stated she is fine with waiting until her scheduled apt in April with PCP. Patient advised to practice safe sex practices until then. Patient verbalized understanding. Note given for work.

## 2019-11-30 ENCOUNTER — Ambulatory Visit: Payer: Medicaid Other | Admitting: Family Medicine

## 2019-12-01 ENCOUNTER — Encounter: Payer: Self-pay | Admitting: Family Medicine

## 2019-12-01 NOTE — Progress Notes (Signed)
Certified No-Show letter mailed to patient.

## 2019-12-16 ENCOUNTER — Telehealth: Payer: Self-pay | Admitting: Family Medicine

## 2019-12-16 ENCOUNTER — Encounter: Payer: Self-pay | Admitting: Family Medicine

## 2019-12-16 NOTE — Telephone Encounter (Signed)
Will forward to MD to make her aware of reason for missing last appointment.  Dontrae Morini,CMA

## 2019-12-16 NOTE — Telephone Encounter (Signed)
Pt stated she did not just no show but her children were in close contact with someone who had Covid and they had to quarantine.

## 2019-12-24 ENCOUNTER — Other Ambulatory Visit: Payer: Self-pay

## 2019-12-24 MED ORDER — DEXCOM G6 TRANSMITTER MISC: 5 refills | Status: AC

## 2019-12-29 ENCOUNTER — Ambulatory Visit: Payer: Medicaid Other | Attending: Internal Medicine

## 2019-12-29 DIAGNOSIS — Z20822 Contact with and (suspected) exposure to covid-19: Secondary | ICD-10-CM

## 2019-12-30 LAB — NOVEL CORONAVIRUS, NAA: SARS-CoV-2, NAA: NOT DETECTED

## 2019-12-30 LAB — SARS-COV-2, NAA 2 DAY TAT

## 2020-01-07 ENCOUNTER — Ambulatory Visit: Payer: Medicaid Other | Admitting: Family Medicine

## 2020-03-20 ENCOUNTER — Other Ambulatory Visit: Payer: Self-pay

## 2020-03-20 NOTE — Telephone Encounter (Signed)
Hello Team,  Dexcom was previously denied for this patient and I just got a refill request. I know you guys now have a process to get this approved. Please advise. Thanks.

## 2020-08-01 ENCOUNTER — Telehealth: Payer: Self-pay | Admitting: Family Medicine

## 2020-08-01 NOTE — Telephone Encounter (Signed)
Need for annual physical appointment.  Patient stated that she already established primary care with a different practice and is no longer be with [facility].  I did advise that she can reestablish care with [place] in the future should she decide otherwise. She verbalized understanding. I will take her off our patients panelist.

## 2020-09-12 ENCOUNTER — Other Ambulatory Visit: Payer: Self-pay | Admitting: Family Medicine

## 2020-09-13 NOTE — Telephone Encounter (Signed)
Patient is no longer ours and we should not be receiving refill request for her. Thanks.

## 2020-11-21 ENCOUNTER — Encounter (HOSPITAL_COMMUNITY): Payer: Self-pay

## 2020-11-21 ENCOUNTER — Emergency Department (HOSPITAL_COMMUNITY)
Admission: EM | Admit: 2020-11-21 | Discharge: 2020-11-21 | Disposition: A | Discharge status: Home / Self Care | Payer: Medicaid Other | Attending: Emergency Medicine | Admitting: Emergency Medicine

## 2020-11-21 ENCOUNTER — Emergency Department (HOSPITAL_COMMUNITY): Payer: Medicaid Other

## 2020-11-21 ENCOUNTER — Other Ambulatory Visit: Payer: Self-pay

## 2020-11-21 DIAGNOSIS — W108XXA Fall (on) (from) other stairs and steps, initial encounter: Secondary | ICD-10-CM | POA: Diagnosis not present

## 2020-11-21 DIAGNOSIS — Z794 Long term (current) use of insulin: Secondary | ICD-10-CM | POA: Diagnosis not present

## 2020-11-21 DIAGNOSIS — J45909 Unspecified asthma, uncomplicated: Secondary | ICD-10-CM | POA: Insufficient documentation

## 2020-11-21 DIAGNOSIS — E1140 Type 2 diabetes mellitus with diabetic neuropathy, unspecified: Secondary | ICD-10-CM | POA: Insufficient documentation

## 2020-11-21 DIAGNOSIS — S99912A Unspecified injury of left ankle, initial encounter: Secondary | ICD-10-CM | POA: Diagnosis present

## 2020-11-21 DIAGNOSIS — Z7984 Long term (current) use of oral hypoglycemic drugs: Secondary | ICD-10-CM | POA: Diagnosis not present

## 2020-11-21 DIAGNOSIS — M25519 Pain in unspecified shoulder: Secondary | ICD-10-CM | POA: Diagnosis not present

## 2020-11-21 DIAGNOSIS — Z7951 Long term (current) use of inhaled steroids: Secondary | ICD-10-CM | POA: Diagnosis not present

## 2020-11-21 MED ORDER — IBUPROFEN 800 MG PO TABS
800.0000 mg | ORAL_TABLET | Freq: Once | ORAL | Status: AC
  Administered 2020-11-21: 800 mg via ORAL
  Filled 2020-11-21: qty 1

## 2020-11-21 NOTE — ED Provider Notes (Signed)
[facility] DEPT Provider Note   CSN: [id number] Arrival date & time: 11/21/20  1559     History Chief Complaint  Patient presents with  . Fall    Lisa Marshall is a 25 y.o. female.  Patient with history of diabetic neuropathy presents the emergency department for acute onset of left ankle and lower extremity pain after a fall down approximately 6 steps today.  Patient also has some pain in her shoulder but is able to move her extremities well.  No numbness or tingling.  No knee or hip pain.  She denies head injury.  She states that the pain is over the base of the ankle and moves into her anterior ankle and lower leg when she dorsiflexes her toes.  No treatment PTA.         Past Medical History:  Diagnosis Date  . Asthma   . Depression   . Diabetic neuropathy ([place])   . Encounter for surveillance of injectable contraceptive 12/23/2014  . History of PID   . Numbness of foot 07/22/2018  . Paronychia of great toe of left foot 10/03/2017  . Pregnancy, multiple   . Syncope   . Vaginal bleeding 05/30/2017  . Yeast infection 10/02/2018    Patient Active Problem List   Diagnosis Date Noted  . Allergic rhinitis 12/29/2018  . Herpes genitalis in women 10/13/2018  . Hypertriglyceridemia 05/30/2017  . Poorly controlled type 2 diabetes mellitus with neuropathy ([place]) 05/23/2017  . Episodic mood disorder ([place]) 06/21/2013  . Morbid obesity ([place]) 11/06/2007  . GERD 11/06/2007  . ASTHMA, INTERMITTENT 10/23/2006    Past Surgical History:  Procedure Laterality Date  . ADENOIDECTOMY    . CESAREAN SECTION N/A 11/18/2013   Procedure: Primary Cesarean Section Delivery Baby "A" Girl @ 0447, Apgars 1/2/4, Baby "B" @ 0450, Apgars 8/9;  Surgeon: Frederico Hamman, MD;  Location: [facility];  Service: Obstetrics;  Laterality: N/A;  . TONSILLECTOMY       OB History    Gravida  1   Para  1   Term  1   Preterm  0   AB  0   Living  2     SAB  0   IAB   0   Ectopic  0   Multiple  1   Live Births  2           Family History  Problem Relation Age of Onset  . Asthma Other   . Diabetes Other   . Cancer Other   . Sickle cell anemia Mother     Social History   Tobacco Use  . Smoking status: Never Smoker  . Smokeless tobacco: Never Used  Vaping Use  . Vaping Use: Never used  Substance Use Topics  . Alcohol use: No  . Drug use: No    Home Medications Prior to Admission medications   Medication Sig Start Date End Date Taking? Authorizing Provider  albuterol (PROVENTIL HFA;VENTOLIN HFA) 108 (90 Base) MCG/ACT inhaler Inhale 2 puffs into the lungs every 6 (six) hours as needed for wheezing or shortness of breath. Patient not taking: Reported on 11/03/2018 09/01/18   Kinnie Feil, MD  Ascorbic Acid (VITAMIN C PO) Take 1 tablet by mouth daily.    [provider]  BD PEN NEEDLE NANO U/F 32G X 4 MM MISC INJECT 10 UNITS DAILY AT 10PM 11/17/17   Zenia Resides, MD  Blood Glucose Monitoring Suppl (FREESTYLE FLASH SYSTEM) KIT  Use to check blood glucose level three times daily 10/28/18   Andrena Mews T, MD  Continuous Blood Gluc Transmit (DEXCOM G6 TRANSMITTER) MISC Use to monitor capillary glucose continuously 12/24/19   Kinnie Feil, MD  DULoxetine (CYMBALTA) 60 MG capsule Take 1 capsule (60 mg total) by mouth daily. 11/03/18   Kinnie Feil, MD  fluconazole (DIFLUCAN) 150 MG tablet Take 1 tablet today, 1 tablet in 3 days, then take 1 tablet WEEKLY thereafter X 6 months 09/02/19   Caroline More, DO  fluticasone (FLONASE) 50 MCG/ACT nasal spray Place 2 sprays into both nostrils daily. 09/15/19   Couture, Cortni S, [name]  gabapentin (NEURONTIN) 300 MG capsule Take 2 capsules (600 mg total) by mouth 3 (three) times daily for 30 days. 09/13/18 12/29/18  Wurst, Marye Round, [name]  glipiZIDE (GLUCOTROL) 10 MG tablet Take 1 tablet (10 mg total) by mouth daily. 12/29/18   Eniola, Phill Myron, MD  ICY HOT LIDOCAINE PLUS MENTHOL 4-1 %  PTCH APPLY 1 PATCH QD. LEAVE ON FOR 12 H IN A 24 HOURS PEROID 09/13/18   [provider]  Insulin Glargine (LANTUS SOLOSTAR) 100 UNIT/ML Solostar Pen Inject 25 Units into the skin daily. 09/02/19   Tammi Klippel, Sherin, DO  Lido-Capsaicin-Men-Methyl Sal 0.5-0.035-5-20 % PTCH Apply 1 patch topically daily. You may leave in place for 12-24 hours Patient not taking: Reported on 11/03/2018 09/13/18   Wurst, [place], [name]  lidocaine (XYLOCAINE) 5 % ointment APPLY EXTERNALLY TO THE AFFECTED AREA THREE TIMES DAILY FOR UP TO 12 DAYS AS NEEDED 11/02/19   Kinnie Feil, MD  loperamide (IMODIUM) 2 MG capsule Take 1 capsule (2 mg total) by mouth at bedtime as needed for diarrhea or loose stools. 03/28/19   Varney Biles, MD  loratadine (CLARITIN) 10 MG tablet Take 1 tablet (10 mg total) by mouth daily. 12/29/18   Kinnie Feil, MD  medroxyPROGESTERone Acetate 150 MG/ML SUSY  09/16/18   [provider]  metFORMIN (GLUCOPHAGE) 500 MG tablet Take 2 tablets (1,000 mg total) by mouth 2 (two) times daily with a meal. TAKE 2 TABLETS BY MOUTH TWICE DAILY WITH FOOD 12/29/18   Kinnie Feil, MD  nystatin (NYSTATIN) powder Apply to breast creases when you have rash twice a day Patient not taking: Reported on 12/29/2018 11/16/18   Martyn Malay, MD  Omega-3 Fatty Acids (FISH OIL) 1000 MG CAPS Take 1,000 capsules by mouth 2 (two) times daily.     [provider]  ondansetron (ZOFRAN ODT) 8 MG disintegrating tablet Take 1 tablet (8 mg total) by mouth every 8 (eight) hours as needed for nausea. 03/28/19   Varney Biles, MD  valACYclovir (VALTREX) 500 MG tablet Take 1 tablet (500 mg total) by mouth 2 (two) times daily. For three days then take one tab daily to suppress outbreaks. 09/02/19   Caroline More, DO    Allergies    Penicillins  Review of Systems   Review of Systems  Musculoskeletal: Positive for arthralgias and myalgias. Negative for back pain, joint swelling and neck pain.  Skin: Negative  for wound.  Neurological: Negative for weakness and numbness.    Physical Exam Updated Vital Signs BP 124/87 (BP Location: Right Arm)   Pulse (!) 110   Temp 98.3 F (36.8 C) (Oral)   Resp 18   Ht 5' (1.524 m)   Wt 101.2 kg   SpO2 100%   BMI 43.55 kg/m   Physical Exam Vitals and nursing note reviewed.  Constitutional:  Appearance: She is well-developed.  HENT:     Head: Normocephalic and atraumatic.  Eyes:     Conjunctiva/sclera: Conjunctivae normal.  Cardiovascular:     Pulses:          Dorsalis pedis pulses are 2+ on the right side and 2+ on the left side.       Posterior tibial pulses are 2+ on the right side and 2+ on the left side.  Musculoskeletal:        General: Tenderness present.     Cervical back: Normal range of motion and neck supple.  Skin:    General: Skin is warm and dry.  Neurological:     Mental Status: She is alert.     Comments: Distal motor, sensation, and vascular intact.      ED Results / Procedures / Treatments   Labs (all labs ordered are listed, but only abnormal results are displayed) Labs Reviewed - No data to display  EKG None  Radiology DG Ankle Complete Left  Result Date: 11/21/2020 CLINICAL DATA:  Left ankle pain and swelling EXAM: LEFT ANKLE COMPLETE - 3+ VIEW COMPARISON:  Radiograph 06/09/2018 FINDINGS: Mild soft tissue swelling of the ankle, most pronounced laterally, with a trace ankle joint effusion. No acute bony abnormality. Specifically, no fracture, subluxation, or dislocation. IMPRESSION: Mild swelling most pronounced laterally with trace effusion. No acute fracture or traumatic malalignment. Electronically Signed   By: Lovena Le M.D.   On: 11/21/2020 17:01    Procedures Procedures   Medications Ordered in ED Medications  ibuprofen (ADVIL) tablet 800 mg (800 mg Oral Given 11/21/20 1637)    ED Course  I have reviewed the triage vital signs and the nursing notes.  Pertinent labs & imaging results that were  available during my care of the patient were reviewed by me and considered in my medical decision making (see chart for details).  Patient seen and examined. X-ray ordered. Medications ordered.   Vital signs reviewed and are as follows: BP 124/87 (BP Location: Right Arm)   Pulse (!) 110   Temp 98.3 F (36.8 C) (Oral)   Resp 18   Ht 5' (1.524 m)   Wt 101.2 kg   SpO2 100%   BMI 43.55 kg/m   5:09 PM X-ray reviewed.   Patient was counseled on RICE protocol and told to rest injury, use ice for no longer than 15 minutes every hour, compress the area, and elevate above the level of their heart as much as possible to reduce swelling. Questions answered. Patient verbalized understanding.      MDM Rules/Calculators/A&P                          Ankle injury: X-ray negative, extremity is neurovascularly intact.    Final Clinical Impression(s) / ED Diagnoses Final diagnoses:  Injury of left ankle, initial encounter    Rx / DC Orders ED Discharge Orders    None       Carlisle Cater, [name] 11/21/20 1711    Quintella Reichert, MD 11/22/20 [phone]

## 2020-11-21 NOTE — ED Notes (Signed)
An After Visit Summary was printed and given to the patient. Discharge instructions given and no further questions at this time.  

## 2020-11-21 NOTE — Discharge Instructions (Signed)
Please read and follow all provided instructions.  Your diagnoses today include:  1. Injury of left ankle, initial encounter     Tests performed today include:  An x-ray of your ankle - does NOT show any broken bones  Vital signs. See below for your results today.   Medications prescribed:  Please use over-the-counter NSAID medications (ibuprofen, naproxen) as directed on the packaging for pain if you do not have any reasons not to take these medications just as weak kidneys or a history of bleeding in your stomach or gut.   Take any prescribed medications only as directed.  Home care instructions:   Follow any educational materials contained in this packet  Follow R.I.C.E. Protocol:  R - rest your injury   I  - use ice on injury without applying directly to skin  C - compress injury with bandage or splint  E - elevate the injury as much as possible  Follow-up instructions: Please follow-up with your primary care provider if you continue to have significant pain or trouble walking in 1 week. In this case you may have a severe sprain that requires further care.   Return instructions:   Please return if your toes are numb or tingling, appear gray or blue, or you have severe pain (also elevate leg and loosen splint or wrap)  Please return to the Emergency Department if you experience worsening symptoms.   Please return if you have any other emergent concerns.  Additional Information:  Your vital signs today were: BP 124/87 (BP Location: Right Arm)   Pulse (!) 110   Temp 98.3 F (36.8 C) (Oral)   Resp 18   Ht 5' (1.524 m)   Wt 101.2 kg   SpO2 100%   BMI 43.55 kg/m  If your blood pressure (BP) was elevated above 135/85 this visit, please have this repeated by your doctor within one month. -------------- Your caregiver has diagnosed you as suffering from an ankle sprain. Ankle sprain occurs when the ligaments that hold the ankle joint together are stretched or torn.  It may take 4 to 6 weeks to heal.  For Activity: If prescribed crutches, use crutches with non-weight bearing for the first few days. Then, you may walk on your ankle as the pain allows, or as instructed. Start gradually with weight bearing on the affected ankle. Once you can walk pain free, then try jogging. When you can run forwards, then you can try moving side-to-side. If you cannot walk without crutches in one week, you need a re-check. --------------

## 2020-11-21 NOTE — ED Triage Notes (Signed)
Patient states she fell down 6 steps while carrying her kid's school project today. Patient c/o left ankle pain and swelling.

## 2021-03-17 ENCOUNTER — Other Ambulatory Visit: Payer: Self-pay | Admitting: Family Medicine

## 2021-03-29 ENCOUNTER — Other Ambulatory Visit: Payer: Self-pay

## 2021-03-29 ENCOUNTER — Emergency Department (HOSPITAL_COMMUNITY): Payer: Medicaid Other

## 2021-03-29 ENCOUNTER — Emergency Department (HOSPITAL_COMMUNITY)
Admission: EM | Admit: 2021-03-29 | Discharge: 2021-03-29 | Disposition: A | Discharge status: Home / Self Care | Payer: Medicaid Other | Attending: Emergency Medicine | Admitting: Emergency Medicine

## 2021-03-29 ENCOUNTER — Encounter (HOSPITAL_COMMUNITY): Payer: Self-pay | Admitting: Emergency Medicine

## 2021-03-29 DIAGNOSIS — Z794 Long term (current) use of insulin: Secondary | ICD-10-CM | POA: Insufficient documentation

## 2021-03-29 DIAGNOSIS — J4520 Mild intermittent asthma, uncomplicated: Secondary | ICD-10-CM | POA: Diagnosis not present

## 2021-03-29 DIAGNOSIS — H65191 Other acute nonsuppurative otitis media, right ear: Secondary | ICD-10-CM | POA: Diagnosis not present

## 2021-03-29 DIAGNOSIS — M542 Cervicalgia: Secondary | ICD-10-CM | POA: Insufficient documentation

## 2021-03-29 DIAGNOSIS — E1140 Type 2 diabetes mellitus with diabetic neuropathy, unspecified: Secondary | ICD-10-CM | POA: Insufficient documentation

## 2021-03-29 DIAGNOSIS — M5441 Lumbago with sciatica, right side: Secondary | ICD-10-CM | POA: Diagnosis not present

## 2021-03-29 DIAGNOSIS — Z7984 Long term (current) use of oral hypoglycemic drugs: Secondary | ICD-10-CM | POA: Insufficient documentation

## 2021-03-29 DIAGNOSIS — Z7951 Long term (current) use of inhaled steroids: Secondary | ICD-10-CM | POA: Diagnosis not present

## 2021-03-29 DIAGNOSIS — H65111 Acute and subacute allergic otitis media (mucoid) (sanguinous) (serous), right ear: Secondary | ICD-10-CM

## 2021-03-29 DIAGNOSIS — H9201 Otalgia, right ear: Secondary | ICD-10-CM | POA: Diagnosis present

## 2021-03-29 DIAGNOSIS — M5440 Lumbago with sciatica, unspecified side: Secondary | ICD-10-CM

## 2021-03-29 MED ORDER — IBUPROFEN 800 MG PO TABS: 800.0000 mg | ORAL_TABLET | Freq: Three times a day (TID) | ORAL | 0 refills | Status: DC | PRN

## 2021-03-29 MED ORDER — SULFAMETHOXAZOLE-TRIMETHOPRIM 800-160 MG PO TABS: 1.0000 | ORAL_TABLET | Freq: Two times a day (BID) | ORAL | 0 refills | Status: AC

## 2021-03-29 NOTE — ED Triage Notes (Signed)
Pt c/o lower back pain x 2 -3 wks. Also c/o changes in hearing and neck pain x 1 wk. Pt using home meds with no relief. Denies injury, but pt reports she's in the process of moving which has required her to move heavy boxes.

## 2021-03-29 NOTE — Discharge Instructions (Addendum)
Follow-up with your family doctor in the next couple weeks for recheck 

## 2021-03-29 NOTE — ED Provider Notes (Signed)
[facility] DEPT Provider Note   CSN: [id number] Arrival date & time: 03/29/21  1922     History Chief Complaint  Patient presents with   Back Pain   Hearing Problem    Lisa Marshall is a 25 y.o. female.  Patient complains of back with no radiation.  Also some neck pain with radiating down left arm and she has a right earache  The history is provided by the patient and medical records. No language interpreter was used.  Back Pain Pain location: Upper back. Quality:  Aching Radiates to: Left arm. Pain severity:  Moderate Pain is:  Worse during the day Onset quality:  Sudden Timing:  Constant Progression:  Worsening Chronicity:  New Context: not emotional stress   Associated symptoms: no abdominal pain, no chest pain and no headaches       Past Medical History:  Diagnosis Date   Asthma    Depression    Diabetic neuropathy ([place])    Encounter for surveillance of injectable contraceptive 12/23/2014   History of PID    Numbness of foot 07/22/2018   Paronychia of great toe of left foot 10/03/2017   Pregnancy, multiple    Syncope    Vaginal bleeding 05/30/2017   Yeast infection 10/02/2018    Patient Active Problem List   Diagnosis Date Noted   Allergic rhinitis 12/29/2018   Herpes genitalis in women 10/13/2018   Hypertriglyceridemia 05/30/2017   Poorly controlled type 2 diabetes mellitus with neuropathy ([place]) 05/23/2017   Episodic mood disorder ([place]) 06/21/2013   Morbid obesity ([place]) 11/06/2007   GERD 11/06/2007   ASTHMA, INTERMITTENT 10/23/2006    Past Surgical History:  Procedure Laterality Date   ADENOIDECTOMY     CESAREAN SECTION N/A 11/18/2013   Procedure: Primary Cesarean Section Delivery Baby "A" Girl @ 0447, Apgars 1/2/4, Baby "B" @ 0450, Apgars 8/9;  Surgeon: Frederico Hamman, MD;  Location: [facility];  Service: Obstetrics;  Laterality: N/A;   TONSILLECTOMY       OB History     Gravida  1   Para  1   Term  1    Preterm  0   AB  0   Living  2      SAB  0   IAB  0   Ectopic  0   Multiple  1   Live Births  2           Family History  Problem Relation Age of Onset   Asthma Other    Diabetes Other    Cancer Other    Sickle cell anemia Mother     Social History   Tobacco Use   Smoking status: Never   Smokeless tobacco: Never  Vaping Use   Vaping Use: Never used  Substance Use Topics   Alcohol use: No   Drug use: No    Home Medications Prior to Admission medications   Medication Sig Start Date End Date Taking? Authorizing Provider  ibuprofen (ADVIL) 800 MG tablet Take 1 tablet (800 mg total) by mouth every 8 (eight) hours as needed. 03/29/21  Yes Milton Ferguson, MD  sulfamethoxazole-trimethoprim (BACTRIM DS) 800-160 MG tablet Take 1 tablet by mouth 2 (two) times daily for 7 days. 03/29/21 04/05/21 Yes Milton Ferguson, MD  albuterol (PROVENTIL HFA;VENTOLIN HFA) 108 (90 Base) MCG/ACT inhaler Inhale 2 puffs into the lungs every 6 (six) hours as needed for wheezing or shortness of breath. Patient not taking: Reported on 11/03/2018 09/01/18  Kinnie Feil, MD  Ascorbic Acid (VITAMIN C PO) Take 1 tablet by mouth daily.    [provider]  BD PEN NEEDLE NANO U/F 32G X 4 MM MISC INJECT 10 UNITS DAILY AT 10PM 11/17/17   Zenia Resides, MD  Blood Glucose Monitoring Suppl (FREESTYLE FLASH SYSTEM) KIT Use to check blood glucose level three times daily 10/28/18   Andrena Mews T, MD  Continuous Blood Gluc Transmit (DEXCOM G6 TRANSMITTER) MISC Use to monitor capillary glucose continuously 12/24/19   Kinnie Feil, MD  DULoxetine (CYMBALTA) 60 MG capsule Take 1 capsule (60 mg total) by mouth daily. 11/03/18   Kinnie Feil, MD  fluconazole (DIFLUCAN) 150 MG tablet Take 1 tablet today, 1 tablet in 3 days, then take 1 tablet WEEKLY thereafter X 6 months 09/02/19   Caroline More, DO  fluticasone (FLONASE) 50 MCG/ACT nasal spray Place 2 sprays into both nostrils daily. 09/15/19    Couture, Cortni S, [name]  gabapentin (NEURONTIN) 300 MG capsule Take 2 capsules (600 mg total) by mouth 3 (three) times daily for 30 days. 09/13/18 12/29/18  Wurst, Marye Round, [name]  glipiZIDE (GLUCOTROL) 10 MG tablet Take 1 tablet (10 mg total) by mouth daily. 12/29/18   Eniola, Phill Myron, MD  ICY HOT LIDOCAINE PLUS MENTHOL 4-1 % PTCH APPLY 1 PATCH QD. LEAVE ON FOR 12 H IN A 24 HOURS PEROID 09/13/18   [provider]  Insulin Glargine (LANTUS SOLOSTAR) 100 UNIT/ML Solostar Pen Inject 25 Units into the skin daily. 09/02/19   Tammi Klippel, Sherin, DO  Lido-Capsaicin-Men-Methyl Sal 0.5-0.035-5-20 % PTCH Apply 1 patch topically daily. You may leave in place for 12-24 hours Patient not taking: Reported on 11/03/2018 09/13/18   Wurst, [place], [name]  lidocaine (XYLOCAINE) 5 % ointment APPLY EXTERNALLY TO THE AFFECTED AREA THREE TIMES DAILY FOR UP TO 12 DAYS AS NEEDED 11/02/19   Kinnie Feil, MD  loperamide (IMODIUM) 2 MG capsule Take 1 capsule (2 mg total) by mouth at bedtime as needed for diarrhea or loose stools. 03/28/19   Varney Biles, MD  loratadine (CLARITIN) 10 MG tablet Take 1 tablet (10 mg total) by mouth daily. 12/29/18   Kinnie Feil, MD  medroxyPROGESTERone Acetate 150 MG/ML SUSY  09/16/18   [provider]  metFORMIN (GLUCOPHAGE) 500 MG tablet Take 2 tablets (1,000 mg total) by mouth 2 (two) times daily with a meal. TAKE 2 TABLETS BY MOUTH TWICE DAILY WITH FOOD 12/29/18   Kinnie Feil, MD  nystatin (NYSTATIN) powder Apply to breast creases when you have rash twice a day Patient not taking: Reported on 12/29/2018 11/16/18   Martyn Malay, MD  Omega-3 Fatty Acids (FISH OIL) 1000 MG CAPS Take 1,000 capsules by mouth 2 (two) times daily.     [provider]  ondansetron (ZOFRAN ODT) 8 MG disintegrating tablet Take 1 tablet (8 mg total) by mouth every 8 (eight) hours as needed for nausea. 03/28/19   Varney Biles, MD  valACYclovir (VALTREX) 500 MG tablet Take 1 tablet (500 mg  total) by mouth 2 (two) times daily. For three days then take one tab daily to suppress outbreaks. 09/02/19   Caroline More, DO    Allergies    Penicillins  Review of Systems   Review of Systems  Constitutional:  Negative for appetite change and fatigue.  HENT:  Negative for congestion, ear discharge and sinus pressure.        Earache  Eyes:  Negative for discharge.  Respiratory:  Negative for cough.   Cardiovascular:  Negative for chest pain.  Gastrointestinal:  Negative for abdominal pain and diarrhea.  Genitourinary:  Negative for frequency and hematuria.  Musculoskeletal:  Positive for back pain.       Pain left arm  Skin:  Negative for rash.  Neurological:  Negative for seizures and headaches.  Psychiatric/Behavioral:  Negative for hallucinations.    Physical Exam Updated Vital Signs BP (!) 131/96 (BP Location: Right Arm)   Pulse (!) 113   Temp 97.8 F (36.6 C) (Oral)   Resp 18   Ht 5' (1.524 m)   Wt 97.1 kg   SpO2 100%   BMI 41.79 kg/m   Physical Exam Vitals and nursing note reviewed.  Constitutional:      Appearance: She is well-developed.  HENT:     Head: Normocephalic.     Comments: Right otitis media Eyes:     General: No scleral icterus.    Conjunctiva/sclera: Conjunctivae normal.  Neck:     Thyroid: No thyromegaly.  Cardiovascular:     Rate and Rhythm: Normal rate and regular rhythm.     Heart sounds: No murmur heard.   No friction rub. No gallop.  Pulmonary:     Breath sounds: No stridor. No wheezing or rales.  Chest:     Chest wall: No tenderness.  Abdominal:     General: There is no distension.     Tenderness: There is no abdominal tenderness. There is no rebound.  Musculoskeletal:        General: Normal range of motion.     Cervical back: Neck supple.     Comments: Tenderness left-sided cervical spine  Lymphadenopathy:     Cervical: No cervical adenopathy.  Skin:    Findings: No erythema or rash.  Neurological:     Mental Status: She  is alert and oriented to person, place, and time.     Motor: No abnormal muscle tone.     Coordination: Coordination normal.  Psychiatric:        Behavior: Behavior normal.    ED Results / Procedures / Treatments   Labs (all labs ordered are listed, but only abnormal results are displayed) Labs Reviewed  POC URINE PREG, ED    EKG None  Radiology DG Cervical Spine Complete  Result Date: 03/29/2021 CLINICAL DATA:  Neck strain, neck pain, leg weakness EXAM: CERVICAL SPINE - COMPLETE 4+ VIEW COMPARISON:  None. FINDINGS: Normal cervical lordosis. No acute fracture or listhesis of the cervical spine. Vertebral body height and intervertebral disc heights are preserved. The prevertebral soft tissues are not thickened. The spinal canal is widely patent. Oblique views demonstrate no significant neuroforaminal narrowing. Corticated ossific density seen posterolateral to the spinous process of C3 likely reflects the sequela of remote trauma or inflammation. IMPRESSION: No acute fracture or listhesis. Electronically Signed   By: Fidela Salisbury MD   On: 03/29/2021 22:49   DG Lumbar Spine Complete  Result Date: 03/29/2021 CLINICAL DATA:  Back pain, leg weakness EXAM: LUMBAR SPINE - COMPLETE 4+ VIEW COMPARISON:  None. FINDINGS: There is no evidence of lumbar spine fracture. Alignment is normal. Intervertebral disc spaces are maintained. IMPRESSION: Negative. Electronically Signed   By: Fidela Salisbury MD   On: 03/29/2021 22:50    Procedures Procedures   Medications Ordered in ED Medications - No data to display  ED Course  I have reviewed the triage vital signs and the nursing notes.  Pertinent labs & imaging results that were available  during my care of the patient were reviewed by me and considered in my medical decision making (see chart for details).    MDM Rules/Calculators/A&P                           Right otitis media, and cervical radiculopathy with neck pain Final Clinical  Impression(s) / ED Diagnoses Final diagnoses:  Acute mucoid otitis media of right ear  Acute right-sided low back pain with sciatica, sciatica laterality unspecified    Rx / DC Orders ED Discharge Orders          Ordered    ibuprofen (ADVIL) 800 MG tablet  Every 8 hours PRN        03/29/21 2304    sulfamethoxazole-trimethoprim (BACTRIM DS) 800-160 MG tablet  2 times daily        03/29/21 2304             Milton Ferguson, MD 04/01/21 [id number]

## 2021-05-18 ENCOUNTER — Other Ambulatory Visit: Payer: Self-pay

## 2021-05-18 ENCOUNTER — Encounter: Payer: Self-pay | Admitting: Podiatrist

## 2021-05-18 ENCOUNTER — Ambulatory Visit (INDEPENDENT_AMBULATORY_CARE_PROVIDER_SITE_OTHER): Payer: Medicaid Other | Admitting: Podiatrist

## 2021-05-18 DIAGNOSIS — L84 Corns and callosities: Secondary | ICD-10-CM

## 2021-05-18 DIAGNOSIS — E0843 Diabetes mellitus due to underlying condition with diabetic autonomic (poly)neuropathy: Secondary | ICD-10-CM

## 2021-05-18 NOTE — Progress Notes (Signed)
  Chief Complaint  Patient presents with   Diabetes    Bilateral callus pain and burning sensation in the tip of the toes  Neuropathy pain      HPI: Patient is 25 y.o. female who presents today for calluses on both feet which are painful. She also has burning at the tips of the toes.  She is diabetic and under the care of Dr. Lum Babe.    Patient Active Problem List   Diagnosis Date Noted   Allergic rhinitis 12/29/2018   Herpes genitalis in women 10/13/2018   Hypertriglyceridemia 05/30/2017   Poorly controlled type 2 diabetes mellitus with neuropathy (HCC) 05/23/2017   Episodic mood disorder (HCC) 06/21/2013   Morbid obesity (HCC) 11/06/2007   GERD 11/06/2007   ASTHMA, INTERMITTENT 10/23/2006    Allergies  Allergen Reactions   Penicillins Hives    Has patient had a PCN reaction causing immediate rash, facial/tongue/throat swelling, SOB or lightheadedness with hypotension: Yes (rash/hives) Has patient had a PCN reaction causing severe rash involving mucus membranes or skin necrosis: No Has patient had a PCN reaction that required hospitalization: No Has patient had a PCN reaction occurring within the last 10 years: No If all of the above answers are "NO", then may proceed with Cephalosporin use.      Review of Systems No fevers, chills, nausea, muscle aches, no difficulty breathing, no calf pain, no chest pain or shortness of breath.   Physical Exam  GENERAL APPEARANCE: Alert, conversant. Appropriately groomed. No acute distress.   VASCULAR: Pedal pulses palpable DP and PT bilateral.  Capillary refill time is immediate to all digits,  Proximal to distal cooling it warm to warm.  Digital perfusion adequate.   NEUROLOGIC: sensation is decreased to 5.07 monofilament at 3/5 sites bilateral.  Light touch is intact bilateral, vibratory sensation intact bilateral  MUSCULOSKELETAL: acceptable muscle strength, tone and stability bilateral. Prominent metatarsal heads 3 right and 5  bilateral are noted.    DERMATOLOGIC: skin is warm, supple, and dry.  Hyperkeratotic lesion submetatarsal 3 and 5 right as well as submetatarsal 5 left are noted.  Intact integument is noted post debridement.  Her great toenails also have the underlying skin which appears to be pressing up under the nails.  Potential for a bony exostosis in this area could be present.  Assessment   1. Diabetes mellitus due to underlying condition with diabetic autonomic neuropathy, unspecified whether long term insulin use (HCC)   2. Callus of foot      Plan  Examination discussed with the patient.  Discussed areas of thickness are calluses caused from friction and I recommended paring them down today.  This was accomplished with a #15 blade without complication.  Also recommended some diabetic type inserts and a pair that was extra was dispensed for her to try out.  Also recommended a lace up type shoe as today she presents wearing a croc type sandal. Discussed that the great toenails continue to be a problem in the future she can be seen back and we can x-ray the great toes and see if there are any bone spurs causing the problem here.  She will be seen back in the future if these areas return and are painful otherwise to be seen back as needed.

## 2021-05-18 NOTE — Patient Instructions (Signed)

## 2021-06-11 ENCOUNTER — Encounter (HOSPITAL_COMMUNITY): Payer: Self-pay

## 2021-06-11 ENCOUNTER — Emergency Department (HOSPITAL_COMMUNITY)
Admission: EM | Admit: 2021-06-11 | Discharge: 2021-06-12 | Disposition: A | Discharge status: Home / Self Care | Payer: Medicaid Other | Attending: Emergency Medicine | Admitting: Emergency Medicine

## 2021-06-11 ENCOUNTER — Other Ambulatory Visit: Payer: Self-pay

## 2021-06-11 DIAGNOSIS — Z79899 Other long term (current) drug therapy: Secondary | ICD-10-CM | POA: Diagnosis not present

## 2021-06-11 DIAGNOSIS — R569 Unspecified convulsions: Secondary | ICD-10-CM | POA: Insufficient documentation

## 2021-06-11 DIAGNOSIS — N9489 Other specified conditions associated with female genital organs and menstrual cycle: Secondary | ICD-10-CM | POA: Diagnosis not present

## 2021-06-11 DIAGNOSIS — Z7984 Long term (current) use of oral hypoglycemic drugs: Secondary | ICD-10-CM | POA: Insufficient documentation

## 2021-06-11 DIAGNOSIS — J45909 Unspecified asthma, uncomplicated: Secondary | ICD-10-CM | POA: Insufficient documentation

## 2021-06-11 DIAGNOSIS — E1165 Type 2 diabetes mellitus with hyperglycemia: Secondary | ICD-10-CM | POA: Insufficient documentation

## 2021-06-11 DIAGNOSIS — Y900 Blood alcohol level of less than 20 mg/100 ml: Secondary | ICD-10-CM | POA: Insufficient documentation

## 2021-06-11 DIAGNOSIS — E1140 Type 2 diabetes mellitus with diabetic neuropathy, unspecified: Secondary | ICD-10-CM | POA: Diagnosis not present

## 2021-06-11 DIAGNOSIS — Z794 Long term (current) use of insulin: Secondary | ICD-10-CM | POA: Diagnosis not present

## 2021-06-11 NOTE — ED Triage Notes (Addendum)
Patient BIB [facility] from home. Patient walked in the house and according to her mom patient had a seizure, however per EMS it was not a seizure. Patient has diabetes. Patient does not think she has seizures but she said she does have neuropathy. Patients mom said she has a lot going on right now. When RN asked patient what brought her here she said she feels anxious.    EMS CBG [id number]

## 2021-06-12 ENCOUNTER — Encounter: Payer: Self-pay | Admitting: Neurology

## 2021-06-12 LAB — COMPREHENSIVE METABOLIC PANEL
ALT: 18 U/L (ref 0–44)
AST: 13 U/L — ABNORMAL LOW (ref 15–41)
Albumin: 4.3 g/dL (ref 3.5–5.0)
Alkaline Phosphatase: 89 U/L (ref 38–126)
Anion gap: 11 (ref 5–15)
BUN: 9 mg/dL (ref 6–20)
CO2: 22 mmol/L (ref 22–32)
Calcium: 9.3 mg/dL (ref 8.9–10.3)
Chloride: 100 mmol/L (ref 98–111)
Creatinine, Ser: 0.45 mg/dL (ref 0.44–1.00)
GFR, Estimated: >60 mL/min (ref >60)
Glucose, Bld: 340 mg/dL — ABNORMAL HIGH (ref 70–99)
Potassium: 3.5 mmol/L (ref 3.5–5.1)
Sodium: 133 mmol/L — ABNORMAL LOW (ref 135–145)
Total Bilirubin: 0.9 mg/dL (ref 0.3–1.2)
Total Protein: 7.7 g/dL (ref 6.5–8.1)

## 2021-06-12 LAB — I-STAT BETA HCG BLOOD, ED (MC, WL, AP ONLY): I-stat hCG, quantitative: <5 m[IU]/mL (ref <5)

## 2021-06-12 LAB — CBC
HCT: 39.2 % (ref 36.0–46.0)
Hemoglobin: 13.5 g/dL (ref 12.0–15.0)
MCH: 31.3 pg (ref 26.0–34.0)
MCHC: 34.4 g/dL (ref 30.0–36.0)
MCV: 91 fL (ref 80.0–100.0)
Platelets: 322 10*3/uL (ref 150–400)
RBC: 4.31 MIL/uL (ref 3.87–5.11)
RDW: 11.7 % (ref 11.5–15.5)
WBC: 6.1 10*3/uL (ref 4.0–10.5)
nRBC: 0 % (ref 0.0–0.2)

## 2021-06-12 LAB — ACETAMINOPHEN LEVEL: Acetaminophen (Tylenol), Serum: <10 ug/mL — ABNORMAL LOW (ref 10–30)

## 2021-06-12 LAB — ETHANOL: Alcohol, Ethyl (B): <10 mg/dL (ref <10)

## 2021-06-12 LAB — SALICYLATE LEVEL: Salicylate Lvl: <7 mg/dL — ABNORMAL LOW (ref 7.0–30.0)

## 2021-06-12 NOTE — Discharge Instructions (Addendum)
Work-up today for your seizure-like activity was unremarkable for acute abnormalities. No activity observed today in the ER, and given your normal neuro exam I feel that you are stable for discharge with outpatient neurology management. I have put in a referral for this, they will call you to set up an appointment.  In the interim, do not drive, climb tall heights, or swim. Additionally, avoid being alone to ensure unwitnessed attacks.  Also, your blood sugar was fairly high in the ER today, please follow-up with your PCP in the next few days for further management of this as uncontrolled blood sugars can be very detrimental to your health.  Return if worsening of symptoms.

## 2021-06-12 NOTE — ED Provider Notes (Signed)
[facility] DEPT Provider Note   CSN: [id number] Arrival date & time: 06/11/21  2321     History Chief Complaint  Patient presents with   Panic Attack    Lisa Marshall is a 25 y.o. female.  Patient with past medical history of T2DM presents today with complaint of seizure-like activity. Patient states that her first episode was on Saturday. She states that she will felt 'wonky and heavy,' lost time, woke up on the floor with a headache and 'floaters' in her eyes. Patient states that her great grandma told her that during lost time she convulsed, unsure of how long. States that 3 similar episodes have occurred since Saturday. She endorses that she has bit her tongue and urinated on herself during these episodes. Apparently, EMS called out to her house, evaluated her and told her these were not seizures and instead were likely panic attacks. She denies history of panic attacks or anxiety, however does state that she has been under significant new stress over the past month including loosing her job, relationship issues, and new homelessness. Patient states that she takes metformin and insulin for her diabetes and has been compliant with her regimen, however notes that her blood sugars have been higher of late. Patient denies smoking, endorses occasional alcohol use, and denies recreational drug use. States that she has significant neuropathy in her feet, has been attempting to set up appointment with Neurology for this. She denies recent illness, fevers, chills, chest pain, shortness, of breath, abdominal pain, nausea, vomiting, diarrhea.  Of note, according to triage note, EMS visualized these events and stated they did not appear to be seizure-like activity.    The history is provided by the patient. No language interpreter was used.      Past Medical History:  Diagnosis Date   Asthma    Depression    Diabetic neuropathy ([place])    Encounter for  surveillance of injectable contraceptive 12/23/2014   History of PID    Numbness of foot 07/22/2018   Paronychia of great toe of left foot 10/03/2017   Pregnancy, multiple    Syncope    Vaginal bleeding 05/30/2017   Yeast infection 10/02/2018    Patient Active Problem List   Diagnosis Date Noted   Allergic rhinitis 12/29/2018   Herpes genitalis in women 10/13/2018   Hypertriglyceridemia 05/30/2017   Poorly controlled type 2 diabetes mellitus with neuropathy ([place]) 05/23/2017   Episodic mood disorder ([place]) 06/21/2013   Morbid obesity ([place]) 11/06/2007   GERD 11/06/2007   ASTHMA, INTERMITTENT 10/23/2006    Past Surgical History:  Procedure Laterality Date   ADENOIDECTOMY     CESAREAN SECTION N/A 11/18/2013   Procedure: Primary Cesarean Section Delivery Baby "A" Girl @ 0447, Apgars 1/2/4, Baby "B" @ 0450, Apgars 8/9;  Surgeon: Frederico Hamman, MD;  Location: [facility];  Service: Obstetrics;  Laterality: N/A;   TONSILLECTOMY       OB History     Gravida  1   Para  1   Term  1   Preterm  0   AB  0   Living  2      SAB  0   IAB  0   Ectopic  0   Multiple  1   Live Births  2           Family History  Problem Relation Age of Onset   Asthma Other    Diabetes Other    Cancer Other  Sickle cell anemia Mother     Social History   Tobacco Use   Smoking status: Never   Smokeless tobacco: Never  Vaping Use   Vaping Use: Never used  Substance Use Topics   Alcohol use: No   Drug use: No    Home Medications Prior to Admission medications   Medication Sig Start Date End Date Taking? Authorizing Provider  albuterol (PROVENTIL HFA;VENTOLIN HFA) 108 (90 Base) MCG/ACT inhaler Inhale 2 puffs into the lungs every 6 (six) hours as needed for wheezing or shortness of breath. Patient not taking: Reported on 11/03/2018 09/01/18   Kinnie Feil, MD  Ascorbic Acid (VITAMIN C PO) Take 1 tablet by mouth daily.    [provider]  BD PEN NEEDLE NANO U/F 32G X 4  MM MISC INJECT 10 UNITS DAILY AT 10PM 11/17/17   Zenia Resides, MD  Blood Glucose Monitoring Suppl (FREESTYLE FLASH SYSTEM) KIT Use to check blood glucose level three times daily 10/28/18   Andrena Mews T, MD  Continuous Blood Gluc Transmit (DEXCOM G6 TRANSMITTER) MISC Use to monitor capillary glucose continuously 12/24/19   Kinnie Feil, MD  DULoxetine (CYMBALTA) 60 MG capsule Take 1 capsule (60 mg total) by mouth daily. 11/03/18   Kinnie Feil, MD  fluconazole (DIFLUCAN) 150 MG tablet Take 1 tablet today, 1 tablet in 3 days, then take 1 tablet WEEKLY thereafter X 6 months 09/02/19   Caroline More, DO  fluticasone (FLONASE) 50 MCG/ACT nasal spray Place 2 sprays into both nostrils daily. 09/15/19   Couture, Cortni S, [name]  gabapentin (NEURONTIN) 300 MG capsule Take 2 capsules (600 mg total) by mouth 3 (three) times daily for 30 days. 09/13/18 12/29/18  Wurst, Marye Round, [name]  glipiZIDE (GLUCOTROL) 10 MG tablet Take 1 tablet (10 mg total) by mouth daily. 12/29/18   Kinnie Feil, MD  ibuprofen (ADVIL) 800 MG tablet Take 1 tablet (800 mg total) by mouth every 8 (eight) hours as needed. 03/29/21   Milton Ferguson, MD  ICY HOT LIDOCAINE PLUS MENTHOL 4-1 % PTCH APPLY 1 PATCH QD. LEAVE ON FOR 12 H IN A 24 HOURS PEROID 09/13/18   [provider]  Insulin Glargine (LANTUS SOLOSTAR) 100 UNIT/ML Solostar Pen Inject 25 Units into the skin daily. 09/02/19   Tammi Klippel, Sherin, DO  Lido-Capsaicin-Men-Methyl Sal 0.5-0.035-5-20 % PTCH Apply 1 patch topically daily. You may leave in place for 12-24 hours Patient not taking: Reported on 11/03/2018 09/13/18   Wurst, [place], [name]  lidocaine (XYLOCAINE) 5 % ointment APPLY EXTERNALLY TO THE AFFECTED AREA THREE TIMES DAILY FOR UP TO 12 DAYS AS NEEDED 11/02/19   Kinnie Feil, MD  loperamide (IMODIUM) 2 MG capsule Take 1 capsule (2 mg total) by mouth at bedtime as needed for diarrhea or loose stools. 03/28/19   Varney Biles, MD  loratadine (CLARITIN) 10 MG  tablet Take 1 tablet (10 mg total) by mouth daily. 12/29/18   Kinnie Feil, MD  medroxyPROGESTERone Acetate 150 MG/ML SUSY  09/16/18   [provider]  metFORMIN (GLUCOPHAGE) 500 MG tablet Take 2 tablets (1,000 mg total) by mouth 2 (two) times daily with a meal. TAKE 2 TABLETS BY MOUTH TWICE DAILY WITH FOOD 12/29/18   Kinnie Feil, MD  nystatin (NYSTATIN) powder Apply to breast creases when you have rash twice a day Patient not taking: Reported on 12/29/2018 11/16/18   Martyn Malay, MD  Omega-3 Fatty Acids (FISH OIL) 1000 MG CAPS Take 1,000 capsules by  mouth 2 (two) times daily.     [provider]  ondansetron (ZOFRAN ODT) 8 MG disintegrating tablet Take 1 tablet (8 mg total) by mouth every 8 (eight) hours as needed for nausea. 03/28/19   Varney Biles, MD  valACYclovir (VALTREX) 500 MG tablet Take 1 tablet (500 mg total) by mouth 2 (two) times daily. For three days then take one tab daily to suppress outbreaks. 09/02/19   Caroline More, DO    Allergies    Penicillins  Review of Systems   Review of Systems  Constitutional:  Negative for chills and fever.  HENT:  Negative for congestion and rhinorrhea.   Respiratory:  Negative for cough, shortness of breath, wheezing and stridor.   Cardiovascular:  Negative for chest pain, palpitations and leg swelling.  Gastrointestinal:  Negative for abdominal pain, diarrhea, nausea and vomiting.  Genitourinary:  Negative for difficulty urinating, dysuria and hematuria.  Musculoskeletal:  Negative for back pain, gait problem, neck pain and neck stiffness.  Skin:  Negative for rash and wound.  Neurological:  Negative for dizziness, tremors, syncope, facial asymmetry, speech difficulty, weakness, light-headedness, numbness and headaches.  Psychiatric/Behavioral:  Negative for behavioral problems and confusion.   All other systems reviewed and are negative.  Physical Exam Updated Vital Signs BP 120/83   Pulse 79   Temp 98.6 F (37  C) (Oral)   Resp 18   SpO2 99%   Physical Exam Vitals and nursing note reviewed.  Constitutional:      General: She is not in acute distress.    Appearance: Normal appearance. She is normal weight. She is not ill-appearing, toxic-appearing or diaphoretic.  HENT:     Head: Normocephalic and atraumatic.     Mouth/Throat:     Mouth: Mucous membranes are moist.     Comments: Wound noted to lateral surface of distal tongue Eyes:     General: No scleral icterus.    Extraocular Movements: Extraocular movements intact.     Pupils: Pupils are equal, round, and reactive to light.  Cardiovascular:     Rate and Rhythm: Normal rate and regular rhythm.     Pulses: Normal pulses.     Heart sounds: Normal heart sounds.  Pulmonary:     Effort: Pulmonary effort is normal. No respiratory distress.     Breath sounds: Normal breath sounds.  Abdominal:     General: Abdomen is flat.     Palpations: Abdomen is soft.  Musculoskeletal:        General: Normal range of motion.     Cervical back: Normal range of motion and neck supple.  Skin:    General: Skin is warm and dry.  Neurological:     General: No focal deficit present.     Mental Status: She is alert and oriented to person, place, and time.     GCS: GCS eye subscore is 4. GCS verbal subscore is 5. GCS motor subscore is 6.     Cranial Nerves: Cranial nerves are intact.     Sensory: Sensation is intact.     Motor: Motor function is intact.     Coordination: Coordination is intact.     Comments: Alert and oriented to self, place, time and event.    Speech is fluent, clear without dysarthria or dysphasia.    Strength 5/5 in upper/lower extremities   Sensation intact in upper/lower extremities    CN I not tested  CN II grossly intact visual fields bilaterally. Did not visualize posterior eye.  CN III, IV, VI PERRLA and EOMs intact bilaterally  CN V Intact sensation to sharp and light touch to the face  CN VII facial movements symmetric   CN VIII not tested  CN IX, X no uvula deviation, symmetric rise of soft palate  CN XI 5/5 SCM and trapezius strength bilaterally  CN XII Midline tongue protrusion, symmetric L/R movements   Psychiatric:        Mood and Affect: Mood normal.        Behavior: Behavior normal.    ED Results / Procedures / Treatments   Labs (all labs ordered are listed, but only abnormal results are displayed) Labs Reviewed  COMPREHENSIVE METABOLIC PANEL - Abnormal; Notable for the following components:      Result Value   Sodium 133 (*)    Glucose, Bld 340 (*)    AST 13 (*)    All other components within normal limits  SALICYLATE LEVEL - Abnormal; Notable for the following components:   Salicylate Lvl <8.2 (*)    All other components within normal limits  ACETAMINOPHEN LEVEL - Abnormal; Notable for the following components:   Acetaminophen (Tylenol), Serum <10 (*)    All other components within normal limits  ETHANOL  CBC  RAPID URINE DRUG SCREEN, HOSP PERFORMED  I-STAT BETA HCG BLOOD, ED ([facility], [facility], [facility] ONLY)    EKG None  Radiology No results found.  Procedures Procedures   Medications Ordered in ED Medications - No data to display  ED Course  I have reviewed the triage vital signs and the nursing notes.  Pertinent labs & imaging results that were available during my care of the patient were reviewed by me and considered in my medical decision making (see chart for details).    MDM Rules/Calculators/A&P                         Patient presents today with potentially seizure-like activity vs anxiety/panic attacks. According to triage note, EMS witnessed episode today and deemed to not be seizures. Patient does endorse lost time with tongue wound and urination on herself, and grandma who witnessed episodes apparently told the patient she had been convulsing. Patient denies history of seizures or anxiety, but has had some significant recent life stress.  Lab workup remarkable for  hyperglycemia without signs of DKA/HHS. Patient also endorses that her blood sugars have been higher of late. Upon chart review it appears that she normally presents with significant hyperglycemia. No other acute abnormalities seen, no anemia, leukocytosis, or significant electrolyte abnormality. Patient is completely neurologically intact with extensive neuro exam, no imaging indicated at this time. With addition of significant wait time in the lobby before getting to a room, patient has spent approximately 9 hours in the ER today without any episodes. She is afebrile, non-toxic appearing, in no acute distress, and has no complaints at this time.  Given unremarkable work-up and physical exam findings, feel patient is stable for discharge at this time.  Will put in an order for ambulatory referral to neurology that can see her outpatient for further evaluation.  In the interim, will educate patient on seizure precautions such as not driving, not climbing tall heights, and not swimming.  Patient is amenable with plan, educated on red flag symptoms that would prompt return.  Findings and plan of care discussed with supervising physician Dr. Tyrone Nine who is in agreement.    Final Clinical Impression(s) / ED Diagnoses Final diagnoses:  Seizure-like  activity ([place])    Rx / DC Orders ED Discharge Orders          Ordered    Ambulatory referral to Neurology       Comments: An appointment is requested in approximately: 1 week   06/12/21 0759          An After Visit Summary was printed and given to the patient.    Bud Face, [name] 06/12/21 [place], [place], DO 06/13/21 [phone]

## 2021-06-18 ENCOUNTER — Ambulatory Visit: Payer: Medicaid Other | Admitting: Neurology

## 2021-07-13 ENCOUNTER — Other Ambulatory Visit: Payer: Self-pay

## 2021-07-13 ENCOUNTER — Ambulatory Visit (INDEPENDENT_AMBULATORY_CARE_PROVIDER_SITE_OTHER): Payer: Medicaid Other | Admitting: Neurology

## 2021-07-13 ENCOUNTER — Encounter: Payer: Self-pay | Admitting: Neurology

## 2021-07-13 VITALS — BP 124/84 | HR 100 | Ht 60.0 in | Wt 217.0 lb

## 2021-07-13 DIAGNOSIS — E1165 Type 2 diabetes mellitus with hyperglycemia: Secondary | ICD-10-CM

## 2021-07-13 DIAGNOSIS — R569 Unspecified convulsions: Secondary | ICD-10-CM | POA: Diagnosis not present

## 2021-07-13 DIAGNOSIS — E1140 Type 2 diabetes mellitus with diabetic neuropathy, unspecified: Secondary | ICD-10-CM | POA: Diagnosis not present

## 2021-07-13 NOTE — Patient Instructions (Signed)
Routine EEG MRI brain wo contrast Please follow-up with [facility] for pain management. If you need a referral to another pain management center, please let me know

## 2021-07-13 NOTE — Progress Notes (Signed)
[facility] Clinic Note - Initial Visit   Date: 07/13/21  Lisa Marshall MRN: [id number] DOB: 03-Oct-1995    Thank you for your kind referral of Lisa Marshall for consultation of neuropathy. Although her history is well known to you, please allow [place] to reiterate it for the purpose of our medical record. The patient was accompanied to the clinic by self.   History of Present Illness: Lisa Marshall is a 25 y.o. left-handed female with poorly controlled insulin-dependent diabetes (HbA1c ~10) presenting for evaluation of neuropathy.   She was diagnosed with diabetes mellitus in 2019. Soon after, she began having numbness,tingling, prickly sensation, in the feet, lower legs, hands, and forearms. Symptoms are constant. It is improved with heat and worse with cold.  She has difficulty with opening bottles. She walks unassisted.  She has imbalance and fallen three times. Her last HbA1c > 10.  She takes gabapentin 829m three times daily.  She has taken tramadol and flexeril. She was seeing [facility], but seems like she was referred here. I informed pt that my office does not manage chronic pain.   Last month she went to the ER with shaking spells.  Patient is not aware and she reports being told that her body shakes, she can bite her tongue.  Her mother and grand mother have seen her shaking for 30-45 minutes.  She had had it occur three more  times. Mother notices this after she is stressed. ER notes indicate that EMS personal felt symptoms were more consistent with a panic attack.   She smokes occasionally.  She doesnot drink alcohol. She lives with her mother and works as a sOceanographerfor [employer]    Out-side paper records, electronic medical record, and images have been reviewed where available and summarized as:  Lab Results  Component Value Date   HGBA1C 12.9 (A) 12/29/2018   Lab Results  Component Value  Date   VITAMINB12 232 08/05/2018   Lab Results  Component Value Date   TSH 2.710 08/05/2018    Past Medical History:  Diagnosis Date   Asthma    Depression    [place]abetic [place]    Encounter for surveillance of injectable contraceptive 12/23/2014   History of PID    Numbness of foot 07/22/2018   Paronychia of great toe of left foot 10/03/2017   Pregnancy, multiple    Syncope    Vaginal bleeding 05/30/2017   Yeast infection 10/02/2018    Past Surgical History:  Procedure Laterality Date   ADENOIDECTOMY     CESAREAN SECTION N/A 11/18/2013   Procedure: Primary Cesarean Section Delivery Baby "A" Girl @ 0447, Apgars 1/2/4, Baby "B" @ 0450, Apgars 8/9;  Surgeon: BFrederico Hamman MD;  [facility];  Service: Obstetrics;  Laterality: N/A;   TONSILLECTOMY       Medications:  Outpatient Encounter Medications as of 07/13/2021  Medication Sig   Ascorbic Acid (VITAMIN C PO) Take 1 tablet by mouth daily.   BD PEN NEEDLE NANO U/F 32G X 4 MM MISC INJECT 10 UNITS DAILY AT 10PM   Blood Glucose Monitoring Suppl (FREESTYLE FLASH SYSTEM) KIT Use to check blood glucose level three times daily   Continuous Blood Gluc Transmit (DEXCOM G6 TRANSMITTER) MISC Use to monitor capillary glucose continuously   fluconazole (DIFLUCAN) 150 MG tablet Take 1 tablet today, 1 tablet in 3 days, then take 1 tablet WEEKLY thereafter X 6 months   Insulin  Glargine (LANTUS SOLOSTAR) 100 UNIT/ML Solostar Pen Inject 25 Units into the skin daily.   lidocaine (XYLOCAINE) 5 % ointment APPLY EXTERNALLY TO THE AFFECTED AREA THREE TIMES DAILY FOR UP TO 12 DAYS AS NEEDED   loratadine (CLARITIN) 10 MG tablet Take 1 tablet (10 mg total) by mouth daily.   medroxyPROGESTERone Acetate 150 MG/ML SUSY    metFORMIN (GLUCOPHAGE) 500 MG tablet Take 2 tablets (1,000 mg total) by mouth 2 (two) times daily with a meal. TAKE 2 TABLETS BY MOUTH TWICE DAILY WITH FOOD   Omega-3 Fatty Acids (FISH OIL) 1000 MG CAPS Take 1,000 capsules  by mouth 2 (two) times daily.    ondansetron (ZOFRAN ODT) 8 MG disintegrating tablet Take 1 tablet (8 mg total) by mouth every 8 (eight) hours as needed for nausea.   valACYclovir (VALTREX) 500 MG tablet Take 1 tablet (500 mg total) by mouth 2 (two) times daily. For three days then take one tab daily to suppress outbreaks.   albuterol (PROVENTIL HFA;VENTOLIN HFA) 108 (90 Base) MCG/ACT inhaler Inhale 2 puffs into the lungs every 6 (six) hours as needed for wheezing or shortness of breath. (Patient not taking: Reported on 11/03/2018)   gabapentin (NEURONTIN) 300 MG capsule Take 2 capsules (600 mg total) by mouth 3 (three) times daily for 30 days. (Patient not taking: Reported on 07/13/2021)   glipiZIDE (GLUCOTROL) 10 MG tablet Take 1 tablet (10 mg total) by mouth daily. (Patient not taking: Reported on 07/13/2021)   ibuprofen (ADVIL) 800 MG tablet Take 1 tablet (800 mg total) by mouth every 8 (eight) hours as needed. (Patient not taking: Reported on 07/13/2021)   ICY HOT LIDOCAINE PLUS MENTHOL 4-1 % PTCH APPLY 1 PATCH QD. LEAVE ON FOR 12 H IN A 24 HOURS PEROID (Patient not taking: Reported on 07/13/2021)   Lido-Capsaicin-Men-Methyl Sal 0.5-0.035-5-20 % PTCH Apply 1 patch topically daily. You may leave in place for 12-24 hours (Patient not taking: Reported on 11/03/2018)   loperamide (IMODIUM) 2 MG capsule Take 1 capsule (2 mg total) by mouth at bedtime as needed for diarrhea or loose stools. (Patient not taking: Reported on 07/13/2021)   nystatin (NYSTATIN) powder Apply to breast creases when you have rash twice a day (Patient not taking: Reported on 07/13/2021)   [DISCONTINUED] DULoxetine (CYMBALTA) 60 MG capsule Take 1 capsule (60 mg total) by mouth daily. (Patient not taking: Reported on 07/13/2021)   [DISCONTINUED] fluticasone (FLONASE) 50 MCG/ACT nasal spray Place 2 sprays into both nostrils daily. (Patient not taking: Reported on 07/13/2021)   No facility-administered encounter medications on file as  of 07/13/2021.    Allergies:  Allergies  Allergen Reactions   Penicillins Hives    Has patient had a PCN reaction causing immediate rash, facial/tongue/throat swelling, SOB or lightheadedness with hypotension: Yes (rash/hives) Has patient had a PCN reaction causing severe rash involving mucus membranes or skin necrosis: No Has patient had a PCN reaction that required hospitalization: No Has patient had a PCN reaction occurring within the last 10 years: No If all of the above answers are "NO", then may proceed with Cephalosporin use.      Family History: Family History  Problem Relation Age of Onset   Sickle cell anemia Mother    Diabetes Mother        Type 2   Obesity Mother    Asthma Other    Diabetes Other    Cancer Other    Cancer Son        Fibrocycoma  ADD / ADHD Son     Social History: Social History   Tobacco Use   Smoking status: Never   Smokeless tobacco: Never  Vaping Use   Vaping Use: Never used  Substance Use Topics   Alcohol use: No   Drug use: No   Social History   Social History Narrative   Has twins age 74. One boy and one girl     Vital Signs:  BP 124/84   Pulse 100   Ht 5' (1.524 m)   Wt 217 lb (98.4 kg)   SpO2 99%   BMI 42.38 kg/m    Neurological Exam: MENTAL STATUS including orientation to time, place, person, recent and remote memory, attention span and concentration, language, and fund of knowledge is normal.  Speech is not dysarthric.  CRANIAL NERVES: II:  No visual field defects.   III-IV-VI: Pupils equal round and reactive to light.  Normal conjugate, extra-ocular eye movements in all directions of gaze.  No nystagmus.  No ptosis.   V:  Normal facial sensation.    VII:  Normal facial symmetry and movements.   VIII:  Normal hearing and vestibular function.   IX-X:  Normal palatal movement.   XI:  Normal shoulder shrug and head rotation.   XII:  Normal tongue strength and range of motion, no deviation or  fasciculation.  MOTOR:  No atrophy, fasciculations or abnormal movements.  No pronator drift.   Upper Extremity:  Right  Left  Deltoid  5/5   5/5   Biceps  5/5   5/5   Triceps  5/5   5/5   Infraspinatus 5/5  5/5  Medial pectoralis 5/5  5/5  Wrist extensors  5/5   5/5   Wrist flexors  5/5   5/5   Finger extensors  5/5   5/5   Finger flexors  5/5   5/5   Dorsal interossei  5/5   5/5   Abductor pollicis  5/5   5/5   Tone (Ashworth scale)  0  0   Lower Extremity:  Right  Left  Hip flexors  5/5   5/5   Hip extensors  5/5   5/5   Adductor 5/5  5/5  Abductor 5/5  5/5  Knee flexors  5/5   5/5   Knee extensors  5/5   5/5   Dorsiflexors  5/5   5/5   Plantarflexors  5/5   5/5   Toe extensors  5/5   5/5   Toe flexors  5/5   5/5   Tone (Ashworth scale)  0  0   MSRs:  Right        Left                  brachioradialis 2+  2+  biceps 2+  2+  triceps 2+  2+  patellar 2+  2+  ankle jerk 0  0  Hoffman no  no  plantar response down  down   SENSORY:  Hyperesthesia to pin prick, temperature, and vibration in the lower legs and feet. Sensation normal in the hands.  Romberg's sign absent.   COORDINATION/GAIT: Normal finger-to- nose-finger..  Intact rapid alternating movements bilaterally.  Gait appears slow, stable. Unable to toe walk due to toe pain, heel walking and tandem gait intact.    IMPRESSION: Diabetic neuropathy due to poorly controlled diabetes manifesting with neuropathic pain.  Symptoms are consistent with small fiber neuropathy, moreso than large fiber and therefore NCS/EMG will  be of low value.  - She takes gabapentin 862m TID with no relief. Previously tried duloxetine. Unable to use TCA due to prolonged QTc (464)  - Pt informed that we do not manage chronic pain and suggest she follow-up with [facility]    2.  Spells, unlikely seizure, more suggestive of panic attacks/non-epileptic spells (duration too long for a seizure)  - Check routine EEG   - MRI brain  wo contrast  Further recommendations pending results.   Thank you for allowing me to participate in patient's care.  If I can answer any additional questions, I would be pleased to do so.    Sincerely,    Jacky Dross K. PPosey Pronto DO

## 2021-07-30 ENCOUNTER — Other Ambulatory Visit: Payer: Medicaid Other

## 2021-11-14 ENCOUNTER — Other Ambulatory Visit: Payer: Self-pay

## 2021-11-14 ENCOUNTER — Ambulatory Visit: Payer: Medicaid Other | Admitting: Plastic Surgery

## 2021-11-14 ENCOUNTER — Encounter: Payer: Self-pay | Admitting: Plastic Surgery

## 2021-11-14 VITALS — BP 116/80 | HR 99 | Ht 60.0 in | Wt 237.0 lb

## 2021-11-14 DIAGNOSIS — M4004 Postural kyphosis, thoracic region: Secondary | ICD-10-CM

## 2021-11-14 DIAGNOSIS — M545 Low back pain, unspecified: Secondary | ICD-10-CM | POA: Diagnosis not present

## 2021-11-14 DIAGNOSIS — N62 Hypertrophy of breast: Secondary | ICD-10-CM | POA: Diagnosis not present

## 2021-11-14 DIAGNOSIS — M546 Pain in thoracic spine: Secondary | ICD-10-CM | POA: Diagnosis not present

## 2021-11-14 NOTE — Progress Notes (Signed)
? ?Referring Provider ?Fredrich Romans, [place] ?[phone] ?[place],  [place] [address]  ? ?CC:  ?Chief Complaint  ?Patient presents with  ? Advice Only  ? Consult  ?   ? ?Lisa Marshall is an 26 y.o. female.  ?HPI: Patient presents to discuss breast reduction.  She has had years of back pain, neck pain and shoulder grooving related to her large breast.  She tried over-the-counter medications, warm packs, cold packs and supportive bras with little relief.  She also gets rashes beneath her breast that been refractory to over-the-counter treatment along with nystatin powder.  No history of breast procedures or biopsies and no family history of breast cancer.  She does occasionally smoke and is a diabetic.  Recent hemoglobin A1c 12.0. ? ?Allergies  ?Allergen Reactions  ? Penicillins Hives  ?  Has patient had a PCN reaction causing immediate rash, facial/tongue/throat swelling, SOB or lightheadedness with hypotension: Yes (rash/hives) ?Has patient had a PCN reaction causing severe rash involving mucus membranes or skin necrosis: No ?Has patient had a PCN reaction that required hospitalization: No ?Has patient had a PCN reaction occurring within the last 10 years: No ?If all of the above answers are "NO", then may proceed with Cephalosporin use. ? ?  ? ? ?Outpatient Encounter Medications as of 11/14/2021  ?Medication Sig  ? albuterol (PROVENTIL HFA;VENTOLIN HFA) 108 (90 Base) MCG/ACT inhaler Inhale 2 puffs into the lungs every 6 (six) hours as needed for wheezing or shortness of breath.  ? Ascorbic Acid (VITAMIN C PO) Take 1 tablet by mouth daily.  ? BD PEN NEEDLE NANO U/F 32G X 4 MM MISC INJECT 10 UNITS DAILY AT 10PM  ? Blood Glucose Monitoring Suppl (FREESTYLE FLASH SYSTEM) KIT Use to check blood glucose level three times daily  ? Continuous Blood Gluc Transmit (DEXCOM G6 TRANSMITTER) MISC Use to monitor capillary glucose continuously  ? fluconazole (DIFLUCAN) 150 MG tablet Take 1 tablet today, 1 tablet in 3 days,  then take 1 tablet WEEKLY thereafter X 6 months  ? gabapentin (NEURONTIN) 800 MG tablet Take 800 mg by mouth 3 (three) times daily.  ? ibuprofen (ADVIL) 800 MG tablet Take 1 tablet (800 mg total) by mouth every 8 (eight) hours as needed.  ? ICY HOT LIDOCAINE PLUS MENTHOL 4-1 % PTCH   ? Insulin Glargine (LANTUS SOLOSTAR) 100 UNIT/ML Solostar Pen Inject 25 Units into the skin daily.  ? insulin lispro (HUMALOG KWIKPEN) 200 UNIT/ML KwikPen Humalog KwikPen U-200 Insulin 200 unit/mL (3 mL) subcutaneous ? INJECT 14 UNITS SUBCUTANEOUSLY 3 TIMES A DAY BEFORE MEALS  ? Lido-Capsaicin-Men-Methyl Sal 0.5-0.035-5-20 % PTCH Apply 1 patch topically daily. You may leave in place for 12-24 hours  ? lidocaine (XYLOCAINE) 5 % ointment APPLY EXTERNALLY TO THE AFFECTED AREA THREE TIMES DAILY FOR UP TO 12 DAYS AS NEEDED  ? loratadine (CLARITIN) 10 MG tablet Take 1 tablet (10 mg total) by mouth daily.  ? medroxyPROGESTERone Acetate 150 MG/ML SUSY   ? metFORMIN (GLUCOPHAGE) 1000 MG tablet Take 1,000 mg by mouth 2 (two) times daily.  ? Omega-3 Fatty Acids (FISH OIL) 1000 MG CAPS Take 1,000 capsules by mouth 2 (two) times daily.   ? valACYclovir (VALTREX) 500 MG tablet Take 1 tablet (500 mg total) by mouth 2 (two) times daily. For three days then take one tab daily to suppress outbreaks.  ? [DISCONTINUED] gabapentin (NEURONTIN) 300 MG capsule Take 2 capsules (600 mg total) by mouth 3 (three) times daily for 30 days. (Patient  not taking: Reported on 07/13/2021)  ? [DISCONTINUED] glipiZIDE (GLUCOTROL) 10 MG tablet Take 1 tablet (10 mg total) by mouth daily. (Patient not taking: Reported on 07/13/2021)  ? [DISCONTINUED] loperamide (IMODIUM) 2 MG capsule Take 1 capsule (2 mg total) by mouth at bedtime as needed for diarrhea or loose stools. (Patient not taking: Reported on 07/13/2021)  ? [DISCONTINUED] metFORMIN (GLUCOPHAGE) 500 MG tablet Take 2 tablets (1,000 mg total) by mouth 2 (two) times daily with a meal. TAKE 2 TABLETS BY MOUTH TWICE DAILY  WITH FOOD  ? [DISCONTINUED] nystatin (NYSTATIN) powder Apply to breast creases when you have rash twice a day (Patient not taking: Reported on 07/13/2021)  ? [DISCONTINUED] ondansetron (ZOFRAN ODT) 8 MG disintegrating tablet Take 1 tablet (8 mg total) by mouth every 8 (eight) hours as needed for nausea.  ? ?No facility-administered encounter medications on file as of 11/14/2021.  ?  ? ?Past Medical History:  ?Diagnosis Date  ? Asthma   ? Depression   ? Diabetic neuropathy ([place])   ? Encounter for surveillance of injectable contraceptive 12/23/2014  ? History of PID   ? Numbness of foot 07/22/2018  ? Paronychia of great toe of left foot 10/03/2017  ? Pregnancy, multiple   ? Syncope   ? Vaginal bleeding 05/30/2017  ? Yeast infection 10/02/2018  ? ? ?Past Surgical History:  ?Procedure Laterality Date  ? ADENOIDECTOMY    ? CESAREAN SECTION N/A 11/18/2013  ? Procedure: Primary Cesarean Section Delivery Baby "A" Girl @ 2229, Apgars 1/2/4, Baby "B" @ 0450, Apgars 8/9;  Surgeon: Frederico Hamman, MD;  Location: [facility];  Service: Obstetrics;  Laterality: N/A;  ? TONSILLECTOMY    ? ? ?Family History  ?Problem Relation Age of Onset  ? Sickle cell anemia Mother   ? Diabetes Mother   ?     Type 2  ? Obesity Mother   ? Asthma Other   ? Diabetes Other   ? Cancer Other   ? Cancer Son   ?     Fibrocycoma  ? ADD / ADHD Son   ? ? ?Social History  ? ?Social History Narrative  ? Has twins age 66. One boy and one girl   ?  ? ?Review of Systems ?General: Denies fevers, chills, weight loss ?CV: Denies chest pain, shortness of breath, palpitations ? ?Physical Exam ? ?  11/14/2021  ? 11:21 AM 07/13/2021  ?  3:05 PM 06/12/2021  ?  8:12 AM  ?Vitals with BMI  ?Height '5\' 0"'  '5\' 0"'    ?Weight 237 lbs 217 lbs   ?BMI 46.29 42.38   ?Systolic 798 921 194  ?Diastolic 80 84 70  ?Pulse 99 100 79  ?  ?General:  No acute distress,  Alert and oriented, Non-Toxic, Normal speech and affect ?Breast: She has grade 3 ptosis.  Sternal notch to nipple is 38 cm bilaterally.   Nipple to fold is 16 cm bilaterally.  No obvious scars or masses. ? ?Assessment/Plan ?I long discussion with the patient about breast reduction surgery.  We reviewed the details of the surgery along with the risks and benefits.  Unfortunately for her her hemoglobin A1c is too high to be considered a candidate for the surgery.  He would need to be below 8 to be an acceptable level of risk.  She is fully understanding of this and is going to work with her endocrinologist to get the blood sugar under better control.  We will set a follow-up visit for 6  months to reevaluate.  All of her questions were answered. ? ?Cindra Presume ?11/14/2021, 11:42 AM  ? ? ?  ?

## 2022-05-22 ENCOUNTER — Institutional Professional Consult (permissible substitution): Payer: Medicaid Other | Admitting: Plastic Surgery

## 2024-03-12 ENCOUNTER — Emergency Department (HOSPITAL_COMMUNITY)
Admission: EM | Admit: 2024-03-12 | Discharge: 2024-03-13 | Disposition: A | Discharge status: Home / Self Care | Attending: Emergency Medicine | Admitting: Emergency Medicine

## 2024-03-12 ENCOUNTER — Other Ambulatory Visit: Payer: Self-pay

## 2024-03-12 ENCOUNTER — Encounter (HOSPITAL_COMMUNITY): Payer: Self-pay | Admitting: *Deleted

## 2024-03-12 DIAGNOSIS — M545 Low back pain, unspecified: Secondary | ICD-10-CM | POA: Insufficient documentation

## 2024-03-12 DIAGNOSIS — Y92410 Unspecified street and highway as the place of occurrence of the external cause: Secondary | ICD-10-CM | POA: Insufficient documentation

## 2024-03-12 DIAGNOSIS — M542 Cervicalgia: Secondary | ICD-10-CM | POA: Diagnosis not present

## 2024-03-12 LAB — CBG MONITORING, ED: Glucose-Capillary: 376 mg/dL — ABNORMAL HIGH (ref 70–99)

## 2024-03-12 LAB — CBC WITH DIFFERENTIAL/PLATELET
Abs Immature Granulocytes: 0.01 K/uL (ref 0.00–0.07)
Basophils Absolute: 0 K/uL (ref 0.0–0.1)
Basophils Relative: 0 %
Eosinophils Absolute: 0 K/uL (ref 0.0–0.5)
Eosinophils Relative: 1 %
HCT: 38.9 % (ref 36.0–46.0)
Hemoglobin: 13.4 g/dL (ref 12.0–15.0)
Immature Granulocytes: 0 %
Lymphocytes Relative: 45 %
Lymphs Abs: 2.2 K/uL (ref 0.7–4.0)
MCH: 32.6 pg (ref 26.0–34.0)
MCHC: 34.4 g/dL (ref 30.0–36.0)
MCV: 94.6 fL (ref 80.0–100.0)
Monocytes Absolute: 0.3 K/uL (ref 0.1–1.0)
Monocytes Relative: 7 %
Neutro Abs: 2.3 K/uL (ref 1.7–7.7)
Neutrophils Relative %: 47 %
Platelets: 334 K/uL (ref 150–400)
RBC: 4.11 MIL/uL (ref 3.87–5.11)
RDW: 12.4 % (ref 11.5–15.5)
WBC: 4.9 K/uL (ref 4.0–10.5)
nRBC: 0 % (ref 0.0–0.2)

## 2024-03-12 LAB — COMPREHENSIVE METABOLIC PANEL WITH GFR
ALT: 22 U/L (ref 0–44)
AST: 22 U/L (ref 15–41)
Albumin: 3.8 g/dL (ref 3.5–5.0)
Alkaline Phosphatase: 97 U/L (ref 38–126)
Anion gap: 10 (ref 5–15)
BUN: 6 mg/dL (ref 6–20)
CO2: 24 mmol/L (ref 22–32)
Calcium: 9.2 mg/dL (ref 8.9–10.3)
Chloride: 100 mmol/L (ref 98–111)
Creatinine, Ser: 0.75 mg/dL (ref 0.44–1.00)
GFR, Estimated: >60 mL/min (ref >60)
Glucose, Bld: 335 mg/dL — ABNORMAL HIGH (ref 70–99)
Potassium: 4.3 mmol/L (ref 3.5–5.1)
Sodium: 134 mmol/L — ABNORMAL LOW (ref 135–145)
Total Bilirubin: 0.7 mg/dL (ref 0.0–1.2)
Total Protein: 7.1 g/dL (ref 6.5–8.1)

## 2024-03-12 LAB — HCG, SERUM, QUALITATIVE: Preg, Serum: NEGATIVE
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
## 2024-03-13 ENCOUNTER — Emergency Department (HOSPITAL_COMMUNITY)

## 2024-03-13 MED ORDER — IOHEXOL 350 MG/ML SOLN
75.0000 mL | Freq: Once | INTRAVENOUS | Status: AC | PRN
  Administered 2024-03-13: 75 mL via INTRAVENOUS

## 2024-03-13 MED ORDER — KETOROLAC TROMETHAMINE 15 MG/ML IJ SOLN
15.0000 mg | Freq: Once | INTRAMUSCULAR | Status: AC
  Administered 2024-03-13: 15 mg via INTRAVENOUS
  Filled 2024-03-13: qty 1

## 2024-03-13 MED ORDER — CELECOXIB 200 MG PO CAPS: 200.0000 mg | ORAL_CAPSULE | Freq: Two times a day (BID) | ORAL | 0 refills | Status: DC

## 2024-03-13 MED ORDER — OXYCODONE HCL 5 MG PO TABS
10.0000 mg | ORAL_TABLET | Freq: Once | ORAL | Status: AC
  Administered 2024-03-13: 10 mg via ORAL
  Filled 2024-03-13: qty 2
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
## 2024-03-13 NOTE — ED Notes (Signed)
 Patient transported to CT

## 2024-03-13 NOTE — ED Notes (Signed)
 Patient returned from CT

## 2024-03-16 ENCOUNTER — Ambulatory Visit
Admission: EM | Admit: 2024-03-16 | Discharge: 2024-03-16 | Disposition: A | Discharge status: Home / Self Care | Payer: Self-pay | Attending: Emergency Medicine | Admitting: Emergency Medicine

## 2024-03-16 ENCOUNTER — Encounter: Payer: Self-pay | Admitting: Emergency Medicine

## 2024-03-16 DIAGNOSIS — M549 Dorsalgia, unspecified: Secondary | ICD-10-CM

## 2024-03-16 MED ORDER — NAPROXEN 500 MG PO TABS: 500.0000 mg | ORAL_TABLET | Freq: Two times a day (BID) | ORAL | 0 refills | Status: AC

## 2024-03-16 MED ORDER — CYCLOBENZAPRINE HCL 10 MG PO TABS: 10.0000 mg | ORAL_TABLET | Freq: Two times a day (BID) | ORAL | 0 refills | Status: AC | PRN
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
## 2024-03-16 NOTE — Discharge Instructions (Addendum)
 Stop taking the celebrex . Instead start the naproxen . You can use twice daily, with food. This is for pain and inflammation.  You can take the muscle relaxer Flexeril  twice daily. If the medication makes you drowsy, take only at bed time  Please call orthopedics for follow up if back pain is persisting.
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
